# Patient Record
Sex: Female | Born: 1990
Health system: Southern US, Community
[De-identification: ages and names within clinical notes are randomized; demographics above are authoritative.]

## PROBLEM LIST (undated history)

## (undated) ENCOUNTER — Inpatient Hospital Stay: Payer: Self-pay

## (undated) DIAGNOSIS — F32A Depression, unspecified: Secondary | ICD-10-CM

## (undated) DIAGNOSIS — I10 Essential (primary) hypertension: Secondary | ICD-10-CM

## (undated) DIAGNOSIS — H8109 Meniere's disease, unspecified ear: Secondary | ICD-10-CM

## (undated) DIAGNOSIS — R0600 Dyspnea, unspecified: Secondary | ICD-10-CM

## (undated) DIAGNOSIS — F319 Bipolar disorder, unspecified: Secondary | ICD-10-CM

## (undated) DIAGNOSIS — F419 Anxiety disorder, unspecified: Secondary | ICD-10-CM

## (undated) DIAGNOSIS — D649 Anemia, unspecified: Secondary | ICD-10-CM

## (undated) DIAGNOSIS — R42 Dizziness and giddiness: Secondary | ICD-10-CM

## (undated) DIAGNOSIS — T8859XA Other complications of anesthesia, initial encounter: Secondary | ICD-10-CM

## (undated) DIAGNOSIS — F329 Major depressive disorder, single episode, unspecified: Secondary | ICD-10-CM

## (undated) DIAGNOSIS — K219 Gastro-esophageal reflux disease without esophagitis: Secondary | ICD-10-CM

## (undated) HISTORY — DX: Anxiety disorder, unspecified: F41.9

## (undated) HISTORY — DX: Gastro-esophageal reflux disease without esophagitis: K21.9

## (undated) HISTORY — DX: Depression, unspecified: F32.A

## (undated) HISTORY — PX: WRIST SURGERY: SHX841

## (undated) HISTORY — DX: Meniere's disease, unspecified ear: H81.09

## (undated) HISTORY — PX: TUBAL LIGATION: SHX77

## (undated) HISTORY — DX: Major depressive disorder, single episode, unspecified: F32.9

## (undated) HISTORY — PX: WISDOM TOOTH EXTRACTION: SHX21

---
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
## 2011-03-12 ENCOUNTER — Emergency Department: Payer: Self-pay | Admitting: Emergency Medicine

## 2012-04-26 ENCOUNTER — Emergency Department: Payer: Self-pay | Admitting: Internal Medicine

## 2012-04-26 LAB — CBC
HGB: 12.8 g/dL (ref 12.0–16.0)
MCH: 29.1 pg (ref 26.0–34.0)
MCHC: 33 g/dL (ref 32.0–36.0)
RBC: 4.39 10*6/uL (ref 3.80–5.20)

## 2013-06-05 ENCOUNTER — Observation Stay: Payer: Self-pay | Admitting: Specialist

## 2013-06-05 LAB — COMPREHENSIVE METABOLIC PANEL
ANION GAP: 9 (ref 7–16)
Albumin: 3.7 g/dL (ref 3.4–5.0)
Alkaline Phosphatase: 59 U/L
BUN: 12 mg/dL (ref 7–18)
Bilirubin,Total: 0.3 mg/dL (ref 0.2–1.0)
CALCIUM: 8.7 mg/dL (ref 8.5–10.1)
Chloride: 106 mmol/L (ref 98–107)
Co2: 24 mmol/L (ref 21–32)
Creatinine: 0.76 mg/dL (ref 0.60–1.30)
EGFR (Non-African Amer.): >60
GLUCOSE: 142 mg/dL — AB (ref 65–99)
Osmolality: 280 (ref 275–301)
Potassium: 2.7 mmol/L — ABNORMAL LOW (ref 3.5–5.1)
SGOT(AST): 12 U/L — ABNORMAL LOW (ref 15–37)
SGPT (ALT): 18 U/L (ref 12–78)
SODIUM: 139 mmol/L (ref 136–145)
TOTAL PROTEIN: 7.4 g/dL (ref 6.4–8.2)

## 2013-06-05 LAB — CBC WITH DIFFERENTIAL/PLATELET
BASOS ABS: 0.2 10*3/uL — AB (ref 0.0–0.1)
Basophil %: 1.1 %
Eosinophil #: 0.3 10*3/uL (ref 0.0–0.7)
Eosinophil %: 2.2 %
HCT: 39.4 % (ref 35.0–47.0)
HGB: 12.9 g/dL (ref 12.0–16.0)
LYMPHS ABS: 5.9 10*3/uL — AB (ref 1.0–3.6)
Lymphocyte %: 42.2 %
MCH: 28.5 pg (ref 26.0–34.0)
MCHC: 32.8 g/dL (ref 32.0–36.0)
MCV: 87 fL (ref 80–100)
MONOS PCT: 4.7 %
Monocyte #: 0.7 x10 3/mm (ref 0.2–0.9)
NEUTROS ABS: 6.9 10*3/uL — AB (ref 1.4–6.5)
NEUTROS PCT: 49.8 %
Platelet: 320 10*3/uL (ref 150–440)
RBC: 4.55 10*6/uL (ref 3.80–5.20)
RDW: 13.1 % (ref 11.5–14.5)
WBC: 13.9 10*3/uL — ABNORMAL HIGH (ref 3.6–11.0)

## 2013-06-05 LAB — DRUG SCREEN, URINE
Amphetamines, Ur Screen: NEGATIVE (ref ?–1000)
BENZODIAZEPINE, UR SCRN: NEGATIVE (ref ?–200)
Barbiturates, Ur Screen: NEGATIVE (ref ?–200)
COCAINE METABOLITE, UR ~~LOC~~: NEGATIVE (ref ?–300)
Cannabinoid 50 Ng, Ur ~~LOC~~: NEGATIVE (ref ?–50)
MDMA (Ecstasy)Ur Screen: NEGATIVE (ref ?–500)
METHADONE, UR SCREEN: NEGATIVE (ref ?–300)
Opiate, Ur Screen: NEGATIVE (ref ?–300)
Phencyclidine (PCP) Ur S: NEGATIVE (ref ?–25)
TRICYCLIC, UR SCREEN: NEGATIVE (ref ?–1000)

## 2013-06-05 LAB — URINALYSIS, COMPLETE
Bacteria: NONE SEEN
Bilirubin,UR: NEGATIVE
Glucose,UR: NEGATIVE mg/dL (ref 0–75)
KETONE: NEGATIVE
LEUKOCYTE ESTERASE: NEGATIVE
Nitrite: NEGATIVE
PH: 8 (ref 4.5–8.0)
PROTEIN: NEGATIVE
RBC,UR: <1 /HPF (ref 0–5)
Specific Gravity: 1.006 (ref 1.003–1.030)
Squamous Epithelial: <1
WBC UR: <1 /HPF (ref 0–5)

## 2013-06-05 LAB — LIPASE, BLOOD: Lipase: 121 U/L (ref 73–393)

## 2013-06-05 LAB — HCG, QUANTITATIVE, PREGNANCY: Beta Hcg, Quant.: <1 m[IU]/mL — ABNORMAL LOW

## 2013-06-06 LAB — BASIC METABOLIC PANEL
ANION GAP: 4 — AB (ref 7–16)
BUN: 8 mg/dL (ref 7–18)
CALCIUM: 8.2 mg/dL — AB (ref 8.5–10.1)
Chloride: 112 mmol/L — ABNORMAL HIGH (ref 98–107)
Co2: 26 mmol/L (ref 21–32)
Creatinine: 0.63 mg/dL (ref 0.60–1.30)
EGFR (African American): >60
Glucose: 98 mg/dL (ref 65–99)
OSMOLALITY: 281 (ref 275–301)
Potassium: 3.9 mmol/L (ref 3.5–5.1)
SODIUM: 142 mmol/L (ref 136–145)

## 2013-08-15 ENCOUNTER — Other Ambulatory Visit: Payer: Self-pay | Admitting: Emergency Medicine

## 2013-08-15 LAB — TSH: THYROID STIMULATING HORM: 2.61 u[IU]/mL

## 2014-03-07 NOTE — L&D Delivery Note (Signed)
At 2250, pt exhausted and pushing with the vtx at a +4 station. Vtx does not appear to be moving any further. Called Dr Feliberto GottronSchermerhorn to advise of pt status and he approved a vacuum attempt per CNM,. Pt and partner agree with the risks, benefits and alternatives of vacuum including bleeding intracerebral bleeding, ventricular bleeding, potential for perineal repair, risk of damage to the fetal head including sloughing of scalp, scalp trauma and temporary molding of the head. Using Betadine to the vaginal area, a Bell vacuum was applied and pumped to the green and pt pushed with one contraction and the vacuum was not making good suction. So, vacuum was abandoned and a flat KIWI was applied and pulled with some movement of the head with one pop off. The vacuum was re-applied and pulling x 1 with slight movement of the vtx and began to pop-off. At this time, Dr Feliberto GottronSchermerhorn was called and advised that the vtx was not moving after a vacuum attempt. Pushing with UC's until Dr Filbert SchilderSchermerrhorn arrived. See Vacuum times on nursing notes for vacuum.   Dr Mosie EpsteinSchernmerhorn arrived (see times in nursing notes) and assessed the situation. He dictated his part of the delivery.   Pt bonding skin to skin with infant after repair completed.

## 2014-06-14 ENCOUNTER — Emergency Department
Admit: 2014-06-14 | Disposition: A | Discharge status: Home / Self Care | Payer: Self-pay | Admitting: Emergency Medicine

## 2014-06-14 LAB — URINALYSIS, COMPLETE
BLOOD: NEGATIVE
Bilirubin,UR: NEGATIVE
Glucose,UR: NEGATIVE mg/dL (ref 0–75)
KETONE: NEGATIVE
NITRITE: NEGATIVE
PH: 8 (ref 4.5–8.0)
PROTEIN: NEGATIVE
RBC,UR: NONE SEEN /HPF (ref 0–5)
SPECIFIC GRAVITY: 1.012 (ref 1.003–1.030)
Squamous Epithelial: NONE SEEN

## 2014-06-14 LAB — COMPREHENSIVE METABOLIC PANEL
ALBUMIN: 3.6 g/dL
ANION GAP: 6 — AB (ref 7–16)
Alkaline Phosphatase: 38 U/L
BILIRUBIN TOTAL: 0.4 mg/dL
BUN: 6 mg/dL
CO2: 22 mmol/L
Calcium, Total: 9.2 mg/dL
Chloride: 109 mmol/L
Creatinine: 0.36 mg/dL — ABNORMAL LOW
EGFR (African American): >60
EGFR (Non-African Amer.): >60
Glucose: 95 mg/dL
POTASSIUM: 3.4 mmol/L — AB
SGOT(AST): 19 U/L
SGPT (ALT): 16 U/L
Sodium: 137 mmol/L
Total Protein: 6.9 g/dL

## 2014-06-14 LAB — WET PREP, GENITAL

## 2014-06-14 LAB — HCG, QUANTITATIVE, PREGNANCY: BETA HCG, QUANT.: 101087 m[IU]/mL — AB

## 2014-06-15 LAB — CBC WITH DIFFERENTIAL/PLATELET
Basophil #: 0.1 10*3/uL (ref 0.0–0.1)
Basophil %: 0.6 %
EOS ABS: 0.3 10*3/uL (ref 0.0–0.7)
Eosinophil %: 2.6 %
HCT: 35.1 % (ref 35.0–47.0)
HGB: 11.6 g/dL — ABNORMAL LOW (ref 12.0–16.0)
LYMPHS ABS: 2.9 10*3/uL (ref 1.0–3.6)
LYMPHS PCT: 28.7 %
MCH: 28.1 pg (ref 26.0–34.0)
MCHC: 33 g/dL (ref 32.0–36.0)
MCV: 85 fL (ref 80–100)
Monocyte #: 0.6 x10 3/mm (ref 0.2–0.9)
Monocyte %: 5.5 %
Neutrophil #: 6.4 10*3/uL (ref 1.4–6.5)
Neutrophil %: 62.6 %
Platelet: 203 10*3/uL (ref 150–440)
RBC: 4.12 10*6/uL (ref 3.80–5.20)
RDW: 13.2 % (ref 11.5–14.5)
WBC: 10.3 10*3/uL (ref 3.6–11.0)

## 2014-06-15 LAB — GC/CHLAMYDIA PROBE AMP

## 2014-06-28 NOTE — H&P (Signed)
PATIENT NAME:  Sonya [name], Sonya [name] MR#:  [id number] DATE OF BIRTH:  27-Aug-1990  DATE OF ADMISSION:  06/05/2013  PRIMARY CARE PHYSICIAN:  Does not have one.  CHIEF COMPLAINT: Nausea, vomiting and dizziness.   HISTORY OF PRESENT ILLNESS: This is a 24 year old female who presents to the Emergency Room due to sudden onset of nausea and vomiting that began overnight. The patient says she has had 6 episodes of vomiting since this morning, which has been nonbloody and bilious in nature. She denies any sick contacts. She denies any diarrhea or fevers, chills. She does admit to some mild abdominal pain, but that was after her vomiting, not prior to it. She also complained of significant dizziness where she seems to sway to one side. When she opens her eyes, she feels like the room is spinning. She came to the ER for further evaluation. The patient was noted to be hypokalemic and possibly had underlying vertigo. She continues to still have some dizziness and nausea, vomiting, and hospitalist services were contacted for further treatment and evaluation.   REVIEW OF SYSTEMS:  CONSTITUTIONAL: No documented fever. No weight gain or weight loss.  EYES: No blurred or double vision.  ENT: No tinnitus. No postnasal drip. No redness of the oropharynx.  RESPIRATORY: No cough. No wheeze. No hemoptysis. No dyspnea.  CARDIOVASCULAR: No chest pain. No orthopnea, palpitations or syncope.  GASTROINTESTINAL: Positive nausea. Positive vomiting. No diarrhea. Positive abdominal pain. No melena or hematochezia.  GENITOURINARY: No dysuria or hematuria.  ENDOCRINE: No polyuria or nocturia. No heat or cold intolerance.  HEMATOLOGIC: No anemia. No bruising. No bleeding.  INTEGUMENTARY: No rashes. No lesions.  MUSCULOSKELETAL: No arthritis. No swelling. No gout.  NEUROLOGIC: No numbness or tingling. No ataxia. No seizurelike activity.  PSYCHIATRIC: No anxiety. No insomnia. No ADD.   PAST MEDICAL HISTORY:   The patient has no  significant past medical history.   ALLERGIES: CODEINE, WHICH CAUSES NAUSEA AND VOMITING.   SOCIAL HISTORY: No smoking. Occasional alcohol use. No illicit drug abuse. Lives at home with her boyfriend.   FAMILY HISTORY: Mother has some heart disease and thyroid problems. She cannot recall what runs on her father's side of the family.   CURRENT MEDICATIONS: She is currently on no medications.   PHYSICAL EXAMINATION: Presently is as follows:  VITAL SIGNS:  Noted to be: Temperature is 97.5, pulse 93, respirations 16, blood pressure 141/73, sats 100% on room air.  GENERAL: The patient is a pleasant-appearing female in no apparent distress.  HEAD, EYES, EARS, NOSE, THROAT:  She is atraumatic, normocephalic. Extraocular muscles are intact. Pupils equal and reactive to light. Sclerae anicteric. No conjunctival injection. No pharyngeal erythema.  NECK: Supple. There is no jugular venous distention. No bruits. No lymphadenopathy or thyromegaly.  HEART: Regular rate, rhythm. No murmurs. No rubs. No clicks.  LUNGS: Clear to auscultation bilaterally. No rales. No rhonchi. No wheezes.  ABDOMEN: Soft, flat, nontender, nondistended. Has good bowel sounds. No hepatosplenomegaly appreciated.  EXTREMITIES: No evidence of any cyanosis, clubbing or peripheral edema. Has +2  pedal and radial pulses bilaterally.  NEUROLOGIC: The patient is alert, awake and oriented x 3 with no focal motor or sensory deficits appreciated bilaterally.  SKIN: Moist, warm with no rashes appreciated.  LYMPHATIC: There is no cervical or axillary lymphadenopathy.   LABORATORY DATA: Showed a serum glucose of 132, BUN 12, creatinine 0.7, sodium 139, potassium 2.7, chloride 106, bicarb 24. LFTs are within normal limits. White cell count is 13.9, hemoglobin 12.9, hematocrit 39.4,  platelet count 320. Beta-hCG is negative. Urinalysis within normal limits.   The patient did have a CT head without contrast showing no acute abnormality.    ASSESSMENT AND PLAN: This is a 24 year old female with no significant past medical history, presents to the hospital due to nausea, vomiting and dizziness.  1.  Vertigo. This is likely the cause of the patient's intractable nausea, vomiting and dizziness. The patient's CT head is negative. Her clinical symptoms have improved with some Phenergan and Valium in the Emergency Room. She has no further neurological symptoms and a nonfocal neurological exam. For now, I will start her on meclizine t.i.d., also place her on p.r.n. Valium and Phenergan and Zofran. If she does not clinically improve in the next 24 hours, we will consider neurology, or even an ENT evaluation.  2.  Nausea, vomiting; this is likely related to the vertigo. Continue supportive care with antiemetics and follow her clinically.  3.  Hypokalemia. This is likely secondary to the intractable nausea, vomiting, I would replace her electrolytes and follow them in the morning.   The patient is a FULL CODE.   Time spent is 45 minutes.    ____________________________ Rolly Pancake. Cherlynn Kaiser, MD vjs:dmm D: 06/05/2013 10:25:44 ET T: 06/05/2013 10:36:42 ET JOB#: [id number]  cc: Rolly Pancake. Cherlynn Kaiser, MD, <Dictator> Houston Siren MD ELECTRONICALLY SIGNED 06/05/2013 14:53

## 2014-06-28 NOTE — Discharge Summary (Signed)
PATIENT NAME:  Sonya [name], Kyaira MR#:  [id number] DATE OF BIRTH:  06-16-1990  DATE OF ADMISSION:  06/05/2013 DATE OF DISCHARGE:  06/06/2013  For a detailed note, please see the history and physical done on admission.   DIAGNOSES AT DISCHARGE:  Vertigo. Intractable nausea, vomiting due to the vertigo.  Hypokalemia.   DIET:  The patient is being discharged on a regular diet.   ACTIVITY: As tolerated.   DISCHARGE MEDICATIONS: Meclizine 25 mg t.i.d.,  Zofran disintegrating tablet 4 mg q. 6 hours as needed for nausea, vomiting.   PERTINENT STUDIES DONE DURING THE HOSPITAL COURSE: A CT scan of the head done without contrast on admission showing no acute intracranial abnormality.   BRIEF HOSPITAL COURSE: This is a 24 year old female who presented to the hospital with intractable nausea, vomiting, and dizziness.   ASSESSMENT: 1. Vertigo. This was likely the cause of the patient's intractable nausea, vomiting, and dizziness. The patient was treated supportively in the ER but symptoms persisted and therefore was admitted. She was admitted overnight with supportive care on some meclizine t.i.d. along with some p.r.n. Valium and some Zofran and Phenergan for nausea, vomiting. Overnight, the patient's clinical symptoms have significantly improved. She still has some mild dizziness but much improved from yesterday. She was ambulated and did well. She has had no further nausea, vomiting, and is tolerating some clear liquids well. At this point, I am discharging her on some oral meclizine and Zofran as needed. If she continues to have persistent dizziness and vertigo, she likely would benefit from seeing an ENT physician as an outpatient, which I did explain to her.  2. Nausea, vomiting. This was likely secondary to the vertigo. The patient was treated supportively with antiemetics with Phenergan and Zofran and her symptoms have significantly improved.   She is currently being discharged on some as needed Zofran.    3. Hypokalemia. This was likely secondary to the vomiting. It has since then been supplemented and now resolved.   CODE STATUS: The patient is a full code.   DISPOSITION: She is being discharged home.   TIME SPENT: 35 minutes.    ____________________________ Rolly PancakeVivek J. Cherlynn KaiserSainani, MD vjs:dd D: 06/06/2013 15:32:10 ET T: 06/06/2013 19:12:42 ET JOB#: [id number]  cc: Rolly PancakeVivek J. Cherlynn KaiserSainani, MD, <Dictator> Houston SirenVIVEK J [place] Steidle MD ELECTRONICALLY SIGNED 06/16/2013 18:43

## 2014-06-30 ENCOUNTER — Encounter
Admit: 2014-06-30 | Disposition: A | Discharge status: Home / Self Care | Payer: Self-pay | Attending: Obstetrics & Gynecology | Admitting: Obstetrics & Gynecology

## 2014-07-01 DIAGNOSIS — Q928 Other specified trisomies and partial trisomies of autosomes: Secondary | ICD-10-CM | POA: Insufficient documentation

## 2014-07-01 DIAGNOSIS — Q922 Partial trisomy: Secondary | ICD-10-CM | POA: Insufficient documentation

## 2014-07-01 DIAGNOSIS — Z720 Tobacco use: Secondary | ICD-10-CM | POA: Insufficient documentation

## 2014-07-07 ENCOUNTER — Telehealth: Payer: Self-pay | Admitting: Obstetrics and Gynecology

## 2014-07-07 NOTE — Telephone Encounter (Signed)
  Ms. Sonya [name] elected to undergo First Trimester screening on 06/30/14.  To review, first trimester screening, includes nuchal translucency ultrasound screen and/or first trimester maternal serum marker screening.  The nuchal translucency has approximately an 80% detection rate for Down syndrome and can be positive for other chromosome abnormalities as well as heart defects.  When combined with a maternal serum marker screening, the detection rate is up to 90% for Down syndrome and up to 97% for trisomy 13 and 18.     The results of the First Trimester Nuchal Translucency and Biochemical Screening were within normal range.  The risk for Down syndrome is now estimated to be less than 1 in 10,000.  The risk for Trisomy 13/18 is also estimated to be less than 1 in 10,000.  Should more definitive information be desired, we would offer amniocentesis.  Because we do not yet know the effectiveness of combined first and second trimester screening, we do not recommend a maternal serum screen to assess the chance for chromosome conditions.  However, if screening for neural tube defects is desired, maternal serum screening for AFP only can be performed between 15 and [redacted] weeks gestation.

## 2014-10-13 ENCOUNTER — Observation Stay
Admission: EM | Admit: 2014-10-13 | Discharge: 2014-10-13 | Disposition: A | Discharge status: Home / Self Care | Payer: 59 | Attending: Obstetrics and Gynecology | Admitting: Obstetrics and Gynecology

## 2014-10-13 DIAGNOSIS — O26893 Other specified pregnancy related conditions, third trimester: Principal | ICD-10-CM | POA: Insufficient documentation

## 2014-10-13 DIAGNOSIS — R030 Elevated blood-pressure reading, without diagnosis of hypertension: Secondary | ICD-10-CM | POA: Diagnosis present

## 2014-10-13 DIAGNOSIS — Z3A28 28 weeks gestation of pregnancy: Secondary | ICD-10-CM | POA: Insufficient documentation

## 2014-10-13 LAB — COMPREHENSIVE METABOLIC PANEL
ALT: 12 U/L — AB (ref 14–54)
AST: 17 U/L (ref 15–41)
Albumin: 3.3 g/dL — ABNORMAL LOW (ref 3.5–5.0)
Alkaline Phosphatase: 66 U/L (ref 38–126)
Anion gap: 12 (ref 5–15)
BUN: 7 mg/dL (ref 6–20)
CALCIUM: 9.1 mg/dL (ref 8.9–10.3)
CHLORIDE: 100 mmol/L — AB (ref 101–111)
CO2: 20 mmol/L — ABNORMAL LOW (ref 22–32)
Creatinine, Ser: 0.36 mg/dL — ABNORMAL LOW (ref 0.44–1.00)
GFR calc Af Amer: >60 mL/min (ref >60)
GFR calc non Af Amer: >60 mL/min (ref >60)
Glucose, Bld: 88 mg/dL (ref 65–99)
Potassium: 3.3 mmol/L — ABNORMAL LOW (ref 3.5–5.1)
SODIUM: 132 mmol/L — AB (ref 135–145)
TOTAL PROTEIN: 6.8 g/dL (ref 6.5–8.1)
Total Bilirubin: <0.1 mg/dL — ABNORMAL LOW (ref 0.3–1.2)

## 2014-10-13 LAB — CBC
HCT: 32.3 % — ABNORMAL LOW (ref 35.0–47.0)
HEMOGLOBIN: 11 g/dL — AB (ref 12.0–16.0)
MCH: 29.3 pg (ref 26.0–34.0)
MCHC: 34.1 g/dL (ref 32.0–36.0)
MCV: 85.9 fL (ref 80.0–100.0)
PLATELETS: 202 10*3/uL (ref 150–440)
RBC: 3.76 MIL/uL — ABNORMAL LOW (ref 3.80–5.20)
RDW: 13.6 % (ref 11.5–14.5)
WBC: 16.3 10*3/uL — ABNORMAL HIGH (ref 3.6–11.0)

## 2014-10-13 LAB — PROTEIN / CREATININE RATIO, URINE
CREATININE, URINE: 86 mg/dL
PROTEIN CREATININE RATIO: 0.17 mg/mg{creat} — AB (ref 0.00–0.15)
Total Protein, Urine: 15 mg/dL

## 2014-10-13 LAB — URIC ACID: Uric Acid, Serum: 3.7 mg/dL (ref 2.3–6.6)

## 2014-10-13 LAB — LACTATE DEHYDROGENASE: LDH: 105 U/L (ref 98–192)

## 2014-10-13 NOTE — OB Triage Note (Signed)
Pt here after being seen at [facility] clinic for foot pain, states PA saw swelling in right foot, accompanied by increased BP and told pt she may have pre-eclampsia. Did not look at left foot, which is not swollen upon assessment by RN. Pt with tenderness in right foot with downward flexion, has been in constant pain since Friday with no recalled injury. Reflexes brisk, non-pitting edema. Pt reports headache over the weekend that was not relieved by tylenol. States she woke up this AM and headache was gone. No visual changes, no epigastric pain.

## 2014-10-13 NOTE — Progress Notes (Signed)
Patient ID: Sonya [name], female   DOB: 15-Oct-1990, 24 y.o.   MRN: [id number] Cimberly Colette Ribas Jan 10, 1991 G2 P0 [redacted]w[redacted]d presents for eval of PIH for elevated BP at clinic today .  No LOF , no  vaginal bleeding , O;BP 119/72 mmHg  Pulse 102  Temp(Src) 98.8 F (37.1 C) (Oral)  Resp 18  Ht  (1.575 m)  Wt 181 lb (82.101 kg)  BMI 33.10 kg/m2  LMP 03/26/2014 ABD soft  CX not checked  NSTreassuring  Labs: pih labs  A: BP all normal up here , negative urine protein dip  P:d/c home . BAseline PIH labs

## 2014-11-26 ENCOUNTER — Encounter: Payer: Self-pay | Admitting: *Deleted

## 2014-11-26 ENCOUNTER — Observation Stay
Admission: EM | Admit: 2014-11-26 | Discharge: 2014-11-26 | Disposition: A | Discharge status: Home / Self Care | Payer: 59 | Attending: Obstetrics and Gynecology | Admitting: Obstetrics and Gynecology

## 2014-11-26 DIAGNOSIS — Z3A35 35 weeks gestation of pregnancy: Secondary | ICD-10-CM | POA: Diagnosis not present

## 2014-11-26 LAB — CBC WITH DIFFERENTIAL/PLATELET
Basophils Absolute: 0 10*3/uL (ref 0–0.1)
Basophils Relative: 0 %
Eosinophils Absolute: 0.2 10*3/uL (ref 0–0.7)
Eosinophils Relative: 1 %
HCT: 34 % — ABNORMAL LOW (ref 35.0–47.0)
HEMOGLOBIN: 11.6 g/dL — AB (ref 12.0–16.0)
LYMPHS ABS: 2.4 10*3/uL (ref 1.0–3.6)
LYMPHS PCT: 20 %
MCH: 29.9 pg (ref 26.0–34.0)
MCHC: 34.2 g/dL (ref 32.0–36.0)
MCV: 87.5 fL (ref 80.0–100.0)
Monocytes Absolute: 0.6 10*3/uL (ref 0.2–0.9)
Monocytes Relative: 5 %
NEUTROS ABS: 9 10*3/uL — AB (ref 1.4–6.5)
NEUTROS PCT: 74 %
Platelets: 161 10*3/uL (ref 150–440)
RBC: 3.88 MIL/uL (ref 3.80–5.20)
RDW: 13.7 % (ref 11.5–14.5)
WBC: 12.2 10*3/uL — AB (ref 3.6–11.0)

## 2014-11-26 LAB — URINALYSIS COMPLETE WITH MICROSCOPIC (ARMC ONLY)
Bilirubin Urine: NEGATIVE
Glucose, UA: NEGATIVE mg/dL
Hgb urine dipstick: NEGATIVE
Nitrite: NEGATIVE
Protein, ur: NEGATIVE mg/dL
Specific Gravity, Urine: 1.009 (ref 1.005–1.030)
pH: 7 (ref 5.0–8.0)

## 2014-11-26 LAB — FETAL FIBRONECTIN: Fetal Fibronectin: NEGATIVE

## 2014-11-26 LAB — CHLAMYDIA/NGC RT PCR (ARMC ONLY)
Chlamydia Tr: NOT DETECTED
N GONORRHOEAE: NOT DETECTED

## 2014-11-26 LAB — WET PREP, GENITAL
Clue Cells Wet Prep HPF POC: NONE SEEN
Trich, Wet Prep: NONE SEEN
YEAST WET PREP: POSITIVE — AB

## 2014-11-26 MED ORDER — FLUCONAZOLE 50 MG PO TABS
150.0000 mg | ORAL_TABLET | Freq: Once | ORAL | Status: AC
  Administered 2014-11-26: 150 mg via ORAL
  Filled 2014-11-26: qty 3

## 2014-11-26 MED ORDER — LACTATED RINGERS IV SOLN
INTRAVENOUS | Status: DC
  Administered 2014-11-26: 1000 mL via INTRAVENOUS

## 2014-11-26 MED ORDER — ACETAMINOPHEN 325 MG PO TABS: 650.0000 mg | ORAL_TABLET | ORAL | Status: DC | PRN

## 2014-11-26 NOTE — OB Triage Provider Note (Signed)
TRIAGE VISIT with NST   Sonya [name] is a 24 y.o. G1P0. She is at [redacted]w[redacted]d gestation.  Indication: Preterm contractions  S: Resting comfortably.  no VB. Active fetal movement. No recent intercourse. Contractions and cramping all day. No LOF.  O:  BP 126/64 mmHg  Pulse 93  Temp(Src) 98.3 F (36.8 C) (Oral)  Resp 18  Ht  (1.6 m)  Wt 81.647 kg (180 lb)  BMI 31.89 kg/m2  LMP 03/26/2014 Results for orders placed or performed during the hospital encounter of 11/26/14 (from the past 48 hour(s))  Urinalysis complete, with microscopic [facility] only)   Collection Time: 11/26/14  2:08 PM  Result Value Ref Range   Color, Urine STRAW (A) YELLOW   APPearance CLEAR (A) CLEAR   Glucose, UA NEGATIVE NEGATIVE mg/dL   Bilirubin Urine NEGATIVE NEGATIVE   Ketones, ur TRACE (A) NEGATIVE mg/dL   Specific Gravity, Urine 1.009 1.005 - 1.030   Hgb urine dipstick NEGATIVE NEGATIVE   pH 7.0 5.0 - 8.0   Protein, ur NEGATIVE NEGATIVE mg/dL   Nitrite NEGATIVE NEGATIVE   Leukocytes, UA 1+ (A) NEGATIVE   RBC / HPF 0-5 0 - 5 RBC/hpf   WBC, UA 0-5 0 - 5 WBC/hpf   Bacteria, UA RARE (A) NONE SEEN   Squamous Epithelial / LPF 6-30 (A) NONE SEEN   Mucous PRESENT   Fetal fibronectin   Collection Time: 11/26/14  2:10 PM  Result Value Ref Range   Fetal Fibronectin NEGATIVE NEGATIVE   Appearance, FETFIB CLEAR CLEAR  CBC with Differential/Platelet   Collection Time: 11/26/14  2:48 PM  Result Value Ref Range   WBC 12.2 (H) 3.6 - 11.0 K/uL   RBC 3.88 3.80 - 5.20 MIL/uL   Hemoglobin 11.6 (L) 12.0 - 16.0 g/dL   HCT 04.5 (L) 40.9 - 81.1 %   MCV 87.5 80.0 - 100.0 fL   MCH 29.9 26.0 - 34.0 pg   MCHC 34.2 32.0 - 36.0 g/dL   RDW 91.4 78.2 - 95.6 %   Platelets 161 150 - 440 K/uL   Neutrophils Relative % 74 %   Neutro Abs 9.0 (H) 1.4 - 6.5 K/uL   Lymphocytes Relative 20 %   Lymphs Abs 2.4 1.0 - 3.6 K/uL   Monocytes Relative 5 %   Monocytes Absolute 0.6 0.2 - 0.9 K/uL   Eosinophils Relative 1 %   Eosinophils  Absolute 0.2 0 - 0.7 K/uL   Basophils Relative 0 %   Basophils Absolute 0.0 0 - 0.1 K/uL  Wet prep, genital   Collection Time: 11/26/14  3:17 PM  Result Value Ref Range   Yeast Wet Prep HPF POC POSITIVE (A) NONE SEEN   Trich, Wet Prep NONE SEEN NONE SEEN   Clue Cells Wet Prep HPF POC NONE SEEN NONE SEEN   WBC, Wet Prep HPF POC MODERATE (A) NONE SEEN     Gen: NAD, AAOx3      Abd: FNTTP      Ext: Non-tender, Nonedmeatous    FHT: 140, mod var, +accels, no decels TOCO: q2-4 min SVE: Dilation: Fingertip Effacement (%): 50 Station: -3 Exam by:: gck   Bedside wet mount: +for yeast, neg for clue cells, ferning, trich  NST Reactive, see above   A/P:  24 y.o. G1P0 [redacted]w[redacted]d with preterm contractions.   Labor: not present. Unchanged cervix with serial exams.  Preterm contractions: Wet prep sent. Normal amount of white discharge. +yeast finding. Treated with diflucan while in house.  Fetal Wellbeing:  Reassuring Cat 1 tracing.  D/c home stable, precautions reviewed, follow-up as scheduled.

## 2014-11-26 NOTE — OB Triage Note (Addendum)
Pt also states that she has not had anything to eat or drink today due to just waking up when she called office.  Denies any signs or symptoms of urinary tract infection. Denies sexual intercourse in last 24 hours.

## 2014-11-26 NOTE — OB Triage Note (Signed)
Pt presents with complaints of cramping that started yesterday. Denies bleeding. Also complains of pelvic pressure

## 2014-11-26 NOTE — Discharge Instructions (Signed)
Get prescription at pharmacy and take as directed

## 2014-12-10 ENCOUNTER — Other Ambulatory Visit: Payer: Self-pay | Admitting: Obstetrics and Gynecology

## 2014-12-10 DIAGNOSIS — O26843 Uterine size-date discrepancy, third trimester: Secondary | ICD-10-CM

## 2014-12-12 ENCOUNTER — Ambulatory Visit
Admission: RE | Admit: 2014-12-12 | Discharge: 2014-12-12 | Disposition: A | Discharge status: Home / Self Care | Payer: 59 | Source: Ambulatory Visit | Attending: Obstetrics and Gynecology | Admitting: Obstetrics and Gynecology

## 2014-12-12 DIAGNOSIS — O26843 Uterine size-date discrepancy, third trimester: Secondary | ICD-10-CM

## 2014-12-12 DIAGNOSIS — Z36 Encounter for antenatal screening of mother: Secondary | ICD-10-CM | POA: Diagnosis present

## 2014-12-12 DIAGNOSIS — Z3A38 38 weeks gestation of pregnancy: Secondary | ICD-10-CM | POA: Insufficient documentation

## 2014-12-23 ENCOUNTER — Inpatient Hospital Stay
Admission: EM | Admit: 2014-12-23 | Discharge: 2014-12-29 | DRG: 774 | Disposition: A | Discharge status: Home / Self Care | Payer: 59 | Attending: Obstetrics and Gynecology | Admitting: Obstetrics and Gynecology

## 2014-12-23 DIAGNOSIS — S9492XA Injury of unspecified nerve at ankle and foot level, left leg, initial encounter: Secondary | ICD-10-CM | POA: Diagnosis not present

## 2014-12-23 DIAGNOSIS — O133 Gestational [pregnancy-induced] hypertension without significant proteinuria, third trimester: Secondary | ICD-10-CM | POA: Diagnosis present

## 2014-12-23 DIAGNOSIS — H538 Other visual disturbances: Secondary | ICD-10-CM | POA: Diagnosis not present

## 2014-12-23 DIAGNOSIS — Z6835 Body mass index (BMI) 35.0-35.9, adult: Secondary | ICD-10-CM

## 2014-12-23 DIAGNOSIS — R200 Anesthesia of skin: Secondary | ICD-10-CM | POA: Diagnosis not present

## 2014-12-23 DIAGNOSIS — Z87891 Personal history of nicotine dependence: Secondary | ICD-10-CM | POA: Diagnosis not present

## 2014-12-23 DIAGNOSIS — O1494 Unspecified pre-eclampsia, complicating childbirth: Secondary | ICD-10-CM | POA: Diagnosis present

## 2014-12-23 DIAGNOSIS — R000 Tachycardia, unspecified: Secondary | ICD-10-CM | POA: Diagnosis not present

## 2014-12-23 DIAGNOSIS — O9081 Anemia of the puerperium: Secondary | ICD-10-CM | POA: Diagnosis not present

## 2014-12-23 DIAGNOSIS — X58XXXA Exposure to other specified factors, initial encounter: Secondary | ICD-10-CM | POA: Diagnosis not present

## 2014-12-23 DIAGNOSIS — Z3A38 38 weeks gestation of pregnancy: Secondary | ICD-10-CM | POA: Diagnosis not present

## 2014-12-23 DIAGNOSIS — O99214 Obesity complicating childbirth: Secondary | ICD-10-CM | POA: Diagnosis present

## 2014-12-23 DIAGNOSIS — E669 Obesity, unspecified: Secondary | ICD-10-CM | POA: Diagnosis present

## 2014-12-23 DIAGNOSIS — D62 Acute posthemorrhagic anemia: Secondary | ICD-10-CM | POA: Diagnosis not present

## 2014-12-23 DIAGNOSIS — O9943 Diseases of the circulatory system complicating the puerperium: Secondary | ICD-10-CM | POA: Diagnosis not present

## 2014-12-23 LAB — PROTEIN / CREATININE RATIO, URINE
Creatinine, Urine: 122 mg/dL
Protein Creatinine Ratio: 0.48 mg/mg{Cre} — ABNORMAL HIGH (ref 0.00–0.15)
Total Protein, Urine: 59 mg/dL

## 2014-12-23 MED ORDER — TERBUTALINE SULFATE 1 MG/ML IJ SOLN: 0.2500 mg | Freq: Once | INTRAMUSCULAR | Status: DC | PRN

## 2014-12-23 MED ORDER — CITRIC ACID-SODIUM CITRATE 334-500 MG/5ML PO SOLN: 30.0000 mL | ORAL | Status: DC | PRN

## 2014-12-23 MED ORDER — FENTANYL CITRATE (PF) 100 MCG/2ML IJ SOLN: 50.0000 ug | INTRAMUSCULAR | Status: DC | PRN

## 2014-12-23 MED ORDER — LIDOCAINE HCL (PF) 1 % IJ SOLN
30.0000 mL | INTRAMUSCULAR | Status: DC | PRN
  Filled 2014-12-23: qty 30

## 2014-12-23 MED ORDER — DINOPROSTONE 10 MG VA INST
10.0000 mg | VAGINAL_INSERT | Freq: Once | VAGINAL | Status: AC
Start: 2014-12-23 — End: 2014-12-23
  Administered 2014-12-23: 10 mg via VAGINAL
  Filled 2014-12-23: qty 1

## 2014-12-23 MED ORDER — ACETAMINOPHEN 325 MG PO TABS
650.0000 mg | ORAL_TABLET | ORAL | Status: DC | PRN
  Administered 2014-12-24 – 2014-12-26 (×5): 650 mg via ORAL
  Filled 2014-12-23 (×5): qty 2

## 2014-12-23 MED ORDER — LACTATED RINGERS IV SOLN
INTRAVENOUS | Status: DC
  Administered 2014-12-23 – 2014-12-27 (×9): via INTRAVENOUS

## 2014-12-23 MED ORDER — OXYTOCIN 40 UNITS IN LACTATED RINGERS INFUSION - SIMPLE MED
62.5000 mL/h | INTRAVENOUS | Status: DC
  Administered 2014-12-26: 62.5 mL/h via INTRAVENOUS
  Filled 2014-12-23 (×2): qty 1000

## 2014-12-23 MED ORDER — LACTATED RINGERS IV SOLN
500.0000 mL | INTRAVENOUS | Status: DC | PRN
  Administered 2014-12-25: 1000 mL via INTRAVENOUS

## 2014-12-23 MED ORDER — OXYTOCIN BOLUS FROM INFUSION
500.0000 mL | INTRAVENOUS | Status: DC
  Administered 2014-12-25: 500 mL via INTRAVENOUS

## 2014-12-23 MED ORDER — ONDANSETRON HCL 4 MG/2ML IJ SOLN
4.0000 mg | Freq: Four times a day (QID) | INTRAMUSCULAR | Status: DC | PRN
  Administered 2014-12-25 (×2): 4 mg via INTRAVENOUS
  Filled 2014-12-23 (×2): qty 2

## 2014-12-23 NOTE — H&P (Signed)
  OB ADMISSION/ HISTORY & PHYSICAL:  Admission Date: 12/23/2014  9:25 PM  Admit Diagnosis: IOL at 38+6 weeks for pre-eclampsia   Sonya [name] is a 24 y.o. female is a G2P0 at 38+6 weeks today by LMP of 03/26/14 with an EDD of 12/31/14 who called the on call midwife with c/o elevated BPs on her home meter.  Earlier today, her 24 hour urine total protein was 880 and discussed to call if s/s worsen.  We have been closely monitoring her for preeclampsia d/t the onset of proteinuria with elevated BPs, intermittent HAs, and dependent lower and upper extremity edema.   Prenatal History: G1P0   EDC : 12/31/2014, by Last Menstrual Period of 03/26/14 [facility]renatal course complicated by obesity, tobacco use - but quit shortly after she found out she was pregnant, and FOB has a family hx of 4P partial trisomy syndrome  Prenatal Labs: ABO, Rh:  O Positive Antibody:  Negative Rubella:   Immune RPR:   NR HBsAg:   Negative HIV:   Negative GTT: 79 GBS:   Negative Varicella: Immune   Medical / Surgical History :  Past medical history: History reviewed. No pertinent past medical history.   Past surgical history: History reviewed. No pertinent past surgical history.  Family History: History reviewed. No pertinent family history.   Social History:  reports that she has quit smoking. She has never used smokeless tobacco. She reports that she does not drink alcohol or use illicit drugs.   Allergies: Codeine    Current Medications at time of admission:  Prior to Admission medications   Not on File     Review of Systems: Active FM Irregular contraction Denies VB, LOF, HA, visual disturbances, epigastric pain, N/V/C/D Reports HA yesterday   Physical Exam:  VS: Last menstrual period 03/26/2014.   General: alert and oriented, appears calm Heart: RRR Lungs: Clear lung fields Abdomen: Gravid, soft and non-tender, non-distended / uterus: non-tender,  irregular contractions Extremities: +1 dependent edema in bilateral lower extremities, no clonus, +3 reflexes  Genitalia / VE:   Dilation: 1 Effacement (%): 50 Cervical Position: Posterior Station: -2 Exam by:: M.Newton  FHR: baseline rate 145 / variability Moderate/ accelerations + / no decelerations TOCO: irregular  Assessment: 38+[redacted] weeks gestation IOL for preeclampsia  FHR category 1   Plan:  Ad[facility] to [facility] for IOL Cervidil x 1  PIH labs Continuous monitoring Fentanyl PRN for pain Routine labor and delivery orders  Dr Dalbert GarnetBeasley notified of admission / plan of care  Karena AddisonMeredith C Lanier Felty, CNM

## 2014-12-24 ENCOUNTER — Other Ambulatory Visit: Payer: Self-pay | Admitting: Obstetrics & Gynecology

## 2014-12-24 ENCOUNTER — Encounter: Payer: Self-pay | Admitting: *Deleted

## 2014-12-24 LAB — COMPREHENSIVE METABOLIC PANEL
ALT: 12 U/L — ABNORMAL LOW (ref 14–54)
ANION GAP: 7 (ref 5–15)
AST: 20 U/L (ref 15–41)
Albumin: 2.7 g/dL — ABNORMAL LOW (ref 3.5–5.0)
Alkaline Phosphatase: 120 U/L (ref 38–126)
BUN: 10 mg/dL (ref 6–20)
CHLORIDE: 109 mmol/L (ref 101–111)
CO2: 24 mmol/L (ref 22–32)
Calcium: 9 mg/dL (ref 8.9–10.3)
Creatinine, Ser: 0.38 mg/dL — ABNORMAL LOW (ref 0.44–1.00)
Glucose, Bld: 80 mg/dL (ref 65–99)
POTASSIUM: 3.6 mmol/L (ref 3.5–5.1)
Sodium: 140 mmol/L (ref 135–145)
TOTAL PROTEIN: 5.8 g/dL — AB (ref 6.5–8.1)

## 2014-12-24 LAB — CBC
HCT: 32.2 % — ABNORMAL LOW (ref 35.0–47.0)
Hemoglobin: 10.8 g/dL — ABNORMAL LOW (ref 12.0–16.0)
MCH: 29 pg (ref 26.0–34.0)
MCHC: 33.3 g/dL (ref 32.0–36.0)
MCV: 87 fL (ref 80.0–100.0)
PLATELETS: 151 10*3/uL (ref 150–440)
RBC: 3.7 MIL/uL — ABNORMAL LOW (ref 3.80–5.20)
RDW: 13.6 % (ref 11.5–14.5)
WBC: 14.8 10*3/uL — AB (ref 3.6–11.0)

## 2014-12-24 LAB — TYPE AND SCREEN
ABO/RH(D): O POS
ANTIBODY SCREEN: NEGATIVE

## 2014-12-24 LAB — URIC ACID: URIC ACID, SERUM: 5 mg/dL (ref 2.3–6.6)

## 2014-12-24 LAB — LACTATE DEHYDROGENASE: LDH: 126 U/L (ref 98–192)

## 2014-12-24 MED ORDER — DINOPROSTONE 10 MG VA INST
10.0000 mg | VAGINAL_INSERT | Freq: Once | VAGINAL | Status: AC
  Administered 2014-12-24: 10 mg via VAGINAL
  Filled 2014-12-24: qty 1

## 2014-12-24 MED ORDER — SODIUM CHLORIDE 0.9 % IJ SOLN
INTRAMUSCULAR | Status: AC
  Filled 2014-12-24: qty 50

## 2014-12-24 NOTE — Progress Notes (Signed)
Patient ID: Sonya [name], female   DOB: 08/23/1990, 24 y.o.   MRN: [id number] GBS undocumeted . Will treat in labor

## 2014-12-24 NOTE — Progress Notes (Signed)
Allow patient to eat regular diet for breakfast. Replace Cervadil once finished.

## 2014-12-24 NOTE — Progress Notes (Signed)
Patient ID: Sonya [name], female   DOB: 04/21/1990, 24 y.o.   MRN: [id number] [facility]  37[facility]2 weeks. Cervidil in place since midnight  cx now 1/ 70 /-3 VTX  BP good now  fhr : reassuring  A: PIH without severe criteria P: allow to eat and replace cervidil and cont induction

## 2014-12-25 ENCOUNTER — Encounter: Payer: Self-pay | Admitting: Anesthesiology

## 2014-12-25 ENCOUNTER — Inpatient Hospital Stay: Payer: 59 | Admitting: Anesthesiology

## 2014-12-25 LAB — RPR: RPR Ser Ql: NONREACTIVE

## 2014-12-25 MED ORDER — NALBUPHINE HCL 10 MG/ML IJ SOLN
5.0000 mg | INTRAMUSCULAR | Status: DC | PRN
  Filled 2014-12-25: qty 0.5

## 2014-12-25 MED ORDER — HYDROCODONE-ACETAMINOPHEN 5-325 MG PO TABS
1.0000 | ORAL_TABLET | ORAL | Status: DC | PRN
  Administered 2014-12-26: 1 via ORAL
  Filled 2014-12-25 (×2): qty 1

## 2014-12-25 MED ORDER — DIPHENHYDRAMINE HCL 50 MG/ML IJ SOLN: 12.5000 mg | INTRAMUSCULAR | Status: DC | PRN

## 2014-12-25 MED ORDER — DIPHENHYDRAMINE HCL 25 MG PO CAPS: 25.0000 mg | ORAL_CAPSULE | ORAL | Status: DC | PRN

## 2014-12-25 MED ORDER — FENTANYL 2.5 MCG/ML W/ROPIVACAINE 0.2% IN NS 100 ML EPIDURAL INFUSION (ARMC-ANES)
EPIDURAL | Status: AC
  Administered 2014-12-25: 10 mL/h via EPIDURAL
  Filled 2014-12-25: qty 100

## 2014-12-25 MED ORDER — OXYTOCIN 40 UNITS IN LACTATED RINGERS INFUSION - SIMPLE MED
1.0000 m[IU]/min | INTRAVENOUS | Status: DC
  Administered 2014-12-25: 2 m[IU]/min via INTRAVENOUS

## 2014-12-25 MED ORDER — SODIUM CHLORIDE 0.9 % IV SOLN
INTRAVENOUS | Status: AC
  Administered 2014-12-25: 2 g via INTRAVENOUS
  Filled 2014-12-25: qty 2000

## 2014-12-25 MED ORDER — MEPERIDINE HCL 25 MG/ML IJ SOLN: 6.2500 mg | INTRAMUSCULAR | Status: DC | PRN

## 2014-12-25 MED ORDER — NALBUPHINE HCL 10 MG/ML IJ SOLN
5.0000 mg | Freq: Once | INTRAMUSCULAR | Status: DC | PRN
  Filled 2014-12-25: qty 0.5

## 2014-12-25 MED ORDER — AMPICILLIN SODIUM 1 G IJ SOLR
1.0000 g | INTRAMUSCULAR | Status: DC
  Administered 2014-12-25 (×4): 1 g via INTRAVENOUS
  Filled 2014-12-25 (×8): qty 1000

## 2014-12-25 MED ORDER — KETOROLAC TROMETHAMINE 30 MG/ML IJ SOLN: 30.0000 mg | Freq: Four times a day (QID) | INTRAMUSCULAR | Status: AC | PRN

## 2014-12-25 MED ORDER — NALOXONE HCL 0.4 MG/ML IJ SOLN: 0.4000 mg | INTRAMUSCULAR | Status: DC | PRN

## 2014-12-25 MED ORDER — BUPIVACAINE HCL (PF) 0.25 % IJ SOLN
INTRAMUSCULAR | Status: DC | PRN
  Administered 2014-12-25 (×2): 5 mL via EPIDURAL

## 2014-12-25 MED ORDER — ONDANSETRON HCL 4 MG/2ML IJ SOLN: 4.0000 mg | Freq: Three times a day (TID) | INTRAMUSCULAR | Status: DC | PRN

## 2014-12-25 MED ORDER — FENTANYL 2.5 MCG/ML W/ROPIVACAINE 0.2% IN NS 100 ML EPIDURAL INFUSION (ARMC-ANES)
EPIDURAL | Status: AC
  Administered 2014-12-25: 250 ug
  Filled 2014-12-25: qty 100

## 2014-12-25 MED ORDER — NALOXONE HCL 2 MG/2ML IJ SOSY
1.0000 ug/kg/h | PREFILLED_SYRINGE | INTRAMUSCULAR | Status: DC | PRN
  Filled 2014-12-25: qty 2

## 2014-12-25 MED ORDER — SODIUM CHLORIDE 0.9 % IJ SOLN
INTRAMUSCULAR | Status: AC
  Filled 2014-12-25: qty 10

## 2014-12-25 MED ORDER — FENTANYL 2.5 MCG/ML W/ROPIVACAINE 0.2% IN NS 100 ML EPIDURAL INFUSION (ARMC-ANES)
10.0000 mL/h | EPIDURAL | Status: DC
  Administered 2014-12-25: 10 mL/h via EPIDURAL
  Filled 2014-12-25: qty 100

## 2014-12-25 MED ORDER — SODIUM CHLORIDE 0.9 % IV SOLN
2.0000 g | Freq: Once | INTRAVENOUS | Status: AC
  Administered 2014-12-25: 2 g via INTRAVENOUS

## 2014-12-25 MED ORDER — SODIUM CHLORIDE 0.9 % IJ SOLN: 3.0000 mL | INTRAMUSCULAR | Status: DC | PRN

## 2014-12-25 NOTE — Progress Notes (Signed)
S: Resting comfortably withepidural. + CTX, + LOF,  No VB O: Filed Vitals:   12/25/14 0601 12/25/14 0627 12/25/14 0723 12/25/14 0833  BP:  112/57 124/72 115/70  Pulse:  78 88 87  Temp: 98.1 F (36.7 C)  98.3 F (36.8 C)   TempSrc: Oral  Oral   Resp:   20 20  Height:      Weight:      SpO2:        Gen: NAD, AAOx3      Abd: Gravid, EFW 8#8oz      Ext: Non-tender, + edmeatous    FHT: + mod var + accelerations no decelerations TOCO: Q 2-2.5  min SVE: 6/80/vtx-2   A/P:  24 y.o. yo G2P0010 at 9162w1d for    Forebag ruptured at [id number] with mod particualte meconium  FWB: Reassuring Cat 1 tracing. EFW  8#8oz  GBS: neg per pt but, report not found in EPIC or at [facility] so Antibiotics are being given  Labor: IUPC placed and Pitocin at 4 mun/min Report to Dr Feliberto GottronSchermerhorn and agrees with plan of care. Aware of pitocin augmentation and status.    Sonya [name] 8:51 AM

## 2014-12-25 NOTE — Plan of Care (Signed)
Problem: Consults Goal: Birthing Suites Patient Information Press F2 to bring up selections list   Pt 37-[redacted] weeks EGA and Inpatient induction     

## 2014-12-25 NOTE — Progress Notes (Signed)
S: Resting comfortably with epidural. + CTX, no LOF, VB O: Filed Vitals:   12/25/14 1000 12/25/14 1114 12/25/14 1227 12/25/14 1231  BP:  112/57  142/72  Pulse:  80  91  Temp: 97.4 F (36.3 C)  98.4 F (36.9 C)   TempSrc: Oral  Oral   Resp:  20 20   Height:      Weight:      SpO2:         Gen: NAD, AAOx3      Abd: FNTTP      Ext: Non-tender, Nonedmeatous    FHT: +  mod var + accelerations no decelerations TOCO: Q q 2-3 mins, MVU's 200 SVE:8/90/vtx-1   A/P:  24 y.o. yo G2P0010 at 322w1d for  Delivery on Pitocin  Labor:progressing  FWB: Reassuring Cat 1 tracing. EFW 8#8oz  GBS: unknown  Continue to monitor uC/FH's  Antic SVD.   Sharee Pimplearon W Kaveri Perras 1:31 PM

## 2014-12-25 NOTE — Progress Notes (Signed)
S: Resting comfortably with functiuonal epidural. + CTX, no LOF, VB O: Filed Vitals:   12/25/14 1634 12/25/14 1700 12/25/14 1734 12/25/14 1800  BP: 132/92  124/64   Pulse: 103  102   Temp:  98.4 F (36.9 C)    TempSrc:  Oral    Resp:  20  20  Height:      Weight:      SpO2:         Gen: NAD, AAOx3      Abd: Gravid, nT.      Ext: Non-tender, Nonedmeatous    FHT:mod var + accelerations no decelerations TOCO: Q 2 min SVE:C/C/vtx+1 station   A/P:  24 y.o. yo G2P0010 at 1345w1d for  IOL for elevatred protein.  Labor:progressive  FWB: Reassuring Cat 1 tracing. EFW 8#8oz  GBS: unknown Antic SVD, start pushing   Sharee Pimplearon W Jones 6:35 PM

## 2014-12-25 NOTE — Progress Notes (Signed)
S: Resting comfortably with functioning epidural. + CTX, no LOF, VB O: Filed Vitals:   12/25/14 1434 12/25/14 1507 12/25/14 1534 12/25/14 1634  BP: 136/77  134/87 132/92  Pulse: 102  97 103  Temp:  98.2 F (36.8 C)    TempSrc:  Oral    Resp:  20    Height:      Weight:      SpO2:         Gen: NAD, AAOx3      Abd: FNTTP      Ext: Non-tender, Nonedmeatous    FHT: + mod var + accelerations no decelerations TOCO: Q 2 min SVE:9/90%/vtx+1   A/P:  24 y.o. yo G2P0010 at 6448w1d forIOL and delivery.   Labor: Progressing  FWB: Reassuring Cat 1 tracing. EFW 8#8oz  WUJ:[id number]:unknown   Sonya Pimplearon W Sonya [name] 4:52 PM

## 2014-12-25 NOTE — Anesthesia Preprocedure Evaluation (Signed)
Anesthesia Evaluation  Patient identified by MRN, date of birth, ID band Patient awake    Reviewed: Allergy & Precautions, NPO status , Patient's Chart, lab work & pertinent test results, reviewed documented beta blocker date and time   Airway Mallampati: II  TM Distance: >3 FB     Dental  (+) Chipped   Pulmonary former smoker,           Cardiovascular      Neuro/Psych    GI/Hepatic   Endo/Other    Renal/GU      Musculoskeletal   Abdominal   Peds  Hematology   Anesthesia Other Findings   Reproductive/Obstetrics                             Anesthesia Physical Anesthesia Plan  ASA: II  Anesthesia Plan: Epidural   Post-op Pain Management:    Induction:   Airway Management Planned:   Additional Equipment:   Intra-op Plan:   Post-operative Plan:   Informed Consent: I have reviewed the patients History and Physical, chart, labs and discussed the procedure including the risks, benefits and alternatives for the proposed anesthesia with the patient or authorized representative who has indicated his/her understanding and acceptance.     Plan Discussed with:   Anesthesia Plan Comments:         Anesthesia Quick Evaluation

## 2014-12-25 NOTE — Progress Notes (Signed)
S: Resting comfortably with epidural. + CTX, no LOF, VB O: Filed Vitals:   12/25/14 1419 12/25/14 1434 12/25/14 1507 12/25/14 1534  BP: 132/90 136/77  134/87  Pulse: 102 102  97  Temp:   98.2 F (36.8 C)   TempSrc:   Oral   Resp:   20   Height:      Weight:      SpO2:         Gen: NAD, AAOx3      Abd: FNTTP      Ext: Non-tender, Nonedmeatous    FHT:+ mod var + accelerations no decelerations, Cat I strip TOCO: Q 2 min, Adequate MVU's,  SVE: 9/100/vtx-1   A/P:  24 y.o. yo G2P0010 at 461w1d for IOL due to rising protein/BP  Labor: progressing  FWB: Reassuring Cat 1 tracing. EFW 8#8oz  GBS: unknown   Sharee Pimplearon W Hughes [place]ham 3:40 PM

## 2014-12-25 NOTE — Anesthesia Procedure Notes (Signed)
Epidural Patient location during procedure: OB  Staffing Anesthesiologist: Berdine AddisonHOMAS, Nishi Neiswonger Performed by: anesthesiologist   Preanesthetic Checklist Completed: patient identified, site marked, surgical consent, pre-op evaluation, timeout performed, IV checked, risks and benefits discussed and monitors and equipment checked  Epidural Patient position: sitting Prep: Betadine Patient monitoring: heart rate, continuous pulse ox and blood pressure Approach: midline Location: L4-L5 Injection technique: LOR saline  Needle:  Needle type: Tuohy  Needle gauge: 18 G Needle length: 9 cm and 9 Catheter type: closed end flexible Catheter size: 20 Guage Test dose: negative and 1.5% lidocaine with Epi 1:200 K  Assessment Sensory level: T10 Events: blood not aspirated, injection not painful, no injection resistance, negative IV test and no paresthesia  Additional Notes   Patient tolerated the insertion well without complications.[id number] catheter placement. [id number] test. 0242 5ml marcaine .25%. 0247 infusion.Reason for block:procedure for pain

## 2014-12-25 NOTE — Progress Notes (Signed)
Assumed care.  23yo G2P0010 @ 39+1 here for IOL for preeclampsia w/o severe features  O: BP 133/88 mmHg  Pulse 87  Temp(Src) 97.9 F (36.6 C) (Oral)  Resp 16  Ht 5\' 3"  (1.6 m)  Wt 89.812 kg (198 lb)  BMI 35.08 kg/m2  LMP 03/26/2014   S/p cervadil x 2 Foley placed for 1/50/mid  140 mod var, +accels, no decels Toco: irregular  Plan: - continue to monitor BPs - cont IOL and cervical ripening - foley tugs every 4 hours - pitocin when foley comes out - PCN when pit starts - monitor I/O - ok to heplock overnight given stable BPs  Sonya DachJohanna K Shloma Roggenkamp, MD

## 2014-12-26 LAB — CBC
HCT: 23.9 % — ABNORMAL LOW (ref 35.0–47.0)
Hemoglobin: 8 g/dL — ABNORMAL LOW (ref 12.0–16.0)
MCH: 29.4 pg (ref 26.0–34.0)
MCHC: 33.5 g/dL (ref 32.0–36.0)
MCV: 87.7 fL (ref 80.0–100.0)
PLATELETS: 130 10*3/uL — AB (ref 150–440)
RBC: 2.73 MIL/uL — AB (ref 3.80–5.20)
RDW: 14 % (ref 11.5–14.5)
WBC: 19.1 10*3/uL — ABNORMAL HIGH (ref 3.6–11.0)

## 2014-12-26 MED ORDER — BENZOCAINE-MENTHOL 20-0.5 % EX AERO
1.0000 "application " | INHALATION_SPRAY | CUTANEOUS | Status: DC | PRN
  Administered 2014-12-26 – 2014-12-28 (×2): 1 via TOPICAL
  Filled 2014-12-26 (×2): qty 56

## 2014-12-26 MED ORDER — OXYTOCIN 40 UNITS IN LACTATED RINGERS INFUSION - SIMPLE MED: 62.5000 mL/h | INTRAVENOUS | Status: DC | PRN

## 2014-12-26 MED ORDER — PSYLLIUM 95 % PO PACK
1.0000 | PACK | Freq: Every day | ORAL | Status: DC
Start: 2014-12-26 — End: 2014-12-29
  Administered 2014-12-26 – 2014-12-27 (×2): 1 via ORAL
  Filled 2014-12-26 (×6): qty 1

## 2014-12-26 MED ORDER — TETANUS-DIPHTH-ACELL PERTUSSIS 5-2.5-18.5 LF-MCG/0.5 IM SUSP: 0.5000 mL | Freq: Once | INTRAMUSCULAR | Status: DC

## 2014-12-26 MED ORDER — LANOLIN HYDROUS EX OINT: TOPICAL_OINTMENT | CUTANEOUS | Status: DC | PRN

## 2014-12-26 MED ORDER — BISACODYL 10 MG RE SUPP
10.0000 mg | Freq: Every day | RECTAL | Status: DC | PRN
  Filled 2014-12-26: qty 1

## 2014-12-26 MED ORDER — HYDROCODONE-ACETAMINOPHEN 5-325 MG PO TABS
1.0000 | ORAL_TABLET | ORAL | Status: DC | PRN
  Administered 2014-12-26 (×2): 1 via ORAL
  Administered 2014-12-26: 2 via ORAL
  Administered 2014-12-27 – 2014-12-29 (×7): 1 via ORAL
  Filled 2014-12-26: qty 2
  Filled 2014-12-26 (×3): qty 1
  Filled 2014-12-26: qty 2
  Filled 2014-12-26: qty 1
  Filled 2014-12-26: qty 2
  Filled 2014-12-26 (×4): qty 1
  Filled 2014-12-26: qty 2
  Filled 2014-12-26: qty 1
  Filled 2014-12-26 (×2): qty 2

## 2014-12-26 MED ORDER — IBUPROFEN 600 MG PO TABS
ORAL_TABLET | ORAL | Status: AC
  Filled 2014-12-26: qty 1

## 2014-12-26 MED ORDER — PRENATAL MULTIVITAMIN CH
1.0000 | ORAL_TABLET | Freq: Every day | ORAL | Status: DC
  Administered 2014-12-26 – 2014-12-29 (×4): 1 via ORAL
  Filled 2014-12-26 (×6): qty 1

## 2014-12-26 MED ORDER — MEASLES, MUMPS & RUBELLA VAC ~~LOC~~ INJ: 0.5000 mL | INJECTION | Freq: Once | SUBCUTANEOUS | Status: DC

## 2014-12-26 MED ORDER — ACETAMINOPHEN 325 MG PO TABS: 650.0000 mg | ORAL_TABLET | ORAL | Status: DC | PRN

## 2014-12-26 MED ORDER — WITCH HAZEL-GLYCERIN EX PADS: 1.0000 "application " | MEDICATED_PAD | CUTANEOUS | Status: DC | PRN

## 2014-12-26 MED ORDER — DIBUCAINE 1 % RE OINT: 1.0000 "application " | TOPICAL_OINTMENT | RECTAL | Status: DC | PRN

## 2014-12-26 MED ORDER — ZOLPIDEM TARTRATE 5 MG PO TABS: 5.0000 mg | ORAL_TABLET | Freq: Every evening | ORAL | Status: DC | PRN

## 2014-12-26 MED ORDER — FLEET ENEMA 7-19 GM/118ML RE ENEM: 1.0000 | ENEMA | Freq: Every day | RECTAL | Status: DC | PRN

## 2014-12-26 MED ORDER — IBUPROFEN 600 MG PO TABS
600.0000 mg | ORAL_TABLET | Freq: Four times a day (QID) | ORAL | Status: DC
  Administered 2014-12-26 – 2014-12-29 (×14): 600 mg via ORAL
  Filled 2014-12-26 (×12): qty 1

## 2014-12-26 MED ORDER — SODIUM CHLORIDE 0.9 % IJ SOLN: 3.0000 mL | INTRAMUSCULAR | Status: DC | PRN

## 2014-12-26 MED ORDER — DOCUSATE SODIUM 100 MG PO CAPS
100.0000 mg | ORAL_CAPSULE | Freq: Two times a day (BID) | ORAL | Status: DC
  Administered 2014-12-26 – 2014-12-29 (×6): 100 mg via ORAL
  Filled 2014-12-26 (×6): qty 1

## 2014-12-26 MED ORDER — SODIUM CHLORIDE 0.9 % IJ SOLN: 3.0000 mL | Freq: Two times a day (BID) | INTRAMUSCULAR | Status: DC

## 2014-12-26 MED ORDER — SIMETHICONE 80 MG PO CHEW
80.0000 mg | CHEWABLE_TABLET | ORAL | Status: DC | PRN
  Administered 2014-12-27: 80 mg via ORAL
  Filled 2014-12-26: qty 1

## 2014-12-26 MED ORDER — SENNOSIDES-DOCUSATE SODIUM 8.6-50 MG PO TABS
2.0000 | ORAL_TABLET | ORAL | Status: DC
  Administered 2014-12-28: 2 via ORAL
  Filled 2014-12-26 (×3): qty 2

## 2014-12-26 MED ORDER — SODIUM CHLORIDE 0.9 % IV SOLN: 250.0000 mL | INTRAVENOUS | Status: DC | PRN

## 2014-12-26 NOTE — Progress Notes (Addendum)
Post Partum Day 1 Subjective: no complaints  Objective: Blood pressure 129/78, pulse 92, temperature 97.9 F (36.6 C), temperature source Oral, resp. rate 18, height 5\' 3"  (1.6 m), weight 198 lb (89.812 kg), last menstrual period 03/26/2014, SpO2 99 %, unknown if currently breastfeeding.  Physical Exam:  General: alert and cooperative Lochia: appropriate Uterine Fundus: firm Incision: healing well DVT Evaluation: No evidence of DVT seen on physical exam.   Recent Labs  12/23/14 2156 12/26/14 0559  HGB 10.8* 8.0*  HCT 32.2* 23.9*  WBC 14.8* 19.1*  PLT 151 130*    Assessment/Plan: Plan for discharge tomorrow  A: 4th degree lac 2. Failed vacuum with forceps delivery 3. Midline epis extending to 4th degree with local for repair  P: Use Colace 100 mg po BID and Metamucil, Ducolax if needed 2. Ice to perineum due to extreme edema from delivery 3. Lactation to work with breastfeeding today 4. Anemia: Replace Fe   LOS: 3 days   Sharee PimpleCaron W Jones 12/26/2014, 7:40 AM

## 2014-12-26 NOTE — Progress Notes (Signed)
Pt complains of constant nagging headache, and now complains of blurred vision. Dr. Tildon HuskyHalfon notified and states she "will come evaluate patient" and consult to anesthesia. Dr. Norton BlizzardKaphart notified states "call CRNA and someone will follow-up with her tomorrow".

## 2014-12-26 NOTE — Anesthesia Postprocedure Evaluation (Signed)
  Anesthesia Post-op Note  Patient: Sonya [name]  Procedure(s) Performed: * No procedures listed *  Anesthesia type:Epidural  Patient location: Floor 337  Post pain: Pain level controlled  Post assessment: Post-op Vital signs reviewed, Patient's Cardiovascular Status Stable, Respiratory Function Stable, Patent Airway and No signs of Nausea or vomiting  Post vital signs: Reviewed and stable  Last Vitals:  Filed Vitals:   12/26/14 0425  BP: 125/70  Pulse: 98  Temp: 37 C  Resp: 20    Level of consciousness: awake, alert  and patient cooperative  Complications: No apparent anesthesia complications

## 2014-12-26 NOTE — Op Note (Signed)
NAME:  Sonya [name], Sonya [name]               ACCOUNT NO.:  [id number]  MEDICAL RECORD NO.:  [id number]  LOCATION:  LDR1                         FACILITY:  [facility]  PHYSICIAN:  Jennell Cornerhomas Lilith Solana, MDDATE OF BIRTH:  05-13-1990  DATE OF PROCEDURE: DATE OF DISCHARGE:                              OPERATIVE REPORT   PREOPERATIVE DIAGNOSIS: 1. 39+ 1 weeks estimated gestational age. 2. Arrest of descent.  POSTOPERATIVE DIAGNOSIS: 1. 39+ 1 weeks estimated gestational age. 2. Arrest of descent. 3. Fourth degree laceration.  PROCEDURE: 1. Simpson forceps. 2. Fourth degree perineal repair.  SURGEON:  Jennell Cornerhomas Laretta Pyatt, MD.  ANESTHESIA:  Continuous lumbar epidural and 1% lidocaine.  INDICATIONS:  A 24 year old, gravida 2, para 0 patient admitted 3 days prior to the delivery for preeclampsia.  The patient underwent a 3-day induction.  On the third day, the patient progressed to complete, and after 3 hours of pushing, was only able to bring the fetal head down to the +3 out of +3 station.  Certified nurse midwife, Yetta BarreJones, performed a vacuum attempt with 2 pop offs and had notified me and I promptly arrived and reassessed the situation.  The patient felt like she had adequate room and midline episiotomy was performed and an attempt with a repeat Kiwi vacuum with 2 additional pulls with 1 pop off, the fetal head did not progress much at all.  Simpson forceps were brought up to the delivery table and after informing the patient and the husband of the plan of the forceps delivery, they both agreed and the Simpson forceps were placed without difficulty.  Head was assessed to be in the OA presentation.  The forceps were placed without difficulty and with 2 pulls, the fetal head was delivered.  The forceps were removed and the shoulder and body was delivered without difficulty.  The infant girl was slightly floppy and was passed to the nursery staff who assigned Apgar scores of 6 and 8.  The fetal  weight 3720 g.  Placenta was delivered and it was noted that the patient had fourth degree perineal laceration. The rectal mucosa was reapproximated with 3-0 Vicryl suture with a 2 layer closure.  There was no suture identified in the rectal mucosa as deemed by digital exam.  The external sphincter, rectal sphincter was then grasped with 2 Allis clamps and the capsule was reapproximated with 3 separate figure-of-eight sutures using 2-0 Vicryl suture.  Good approximation of the sphincter.  The vaginal tissues were then repaired in a standard fashion with 2-0 Vicryl suture in a running, locking fashion with good approximation of edges.  There were no additional defects noted in the vagina.  The perineal body was brought together with a crown suture of 2-0 Vicryl suture and the skin was reapproximated with a running 3-0 subcuticular suture.  Good cosmetic effect was noted. Rectal exam again demonstrated no mucosal defects with good sphincter tone and the procedure was terminated. Estimated blood loss from delivery and repair approximately 350 mL.  The patient tolerated well.  There were no complications.  The patient's bladder was catheterized at the end of the case.          ______________________________ Jennell Cornerhomas Roslyn Else, MD  TS/MEDQ  D:  12/25/2014  T:  12/26/2014  Job:  [id number]

## 2014-12-27 LAB — PREPARE RBC (CROSSMATCH)

## 2014-12-27 LAB — URINALYSIS COMPLETE WITH MICROSCOPIC (ARMC ONLY)
BACTERIA UA: NONE SEEN
BILIRUBIN URINE: NEGATIVE
Glucose, UA: NEGATIVE mg/dL
Hgb urine dipstick: NEGATIVE
KETONES UR: NEGATIVE mg/dL
LEUKOCYTES UA: NEGATIVE
NITRITE: NEGATIVE
PROTEIN: NEGATIVE mg/dL
SPECIFIC GRAVITY, URINE: 1.009 (ref 1.005–1.030)
Squamous Epithelial / LPF: NONE SEEN
pH: 6 (ref 5.0–8.0)

## 2014-12-27 LAB — CBC
HCT: 21.6 % — ABNORMAL LOW (ref 35.0–47.0)
Hemoglobin: 7.3 g/dL — ABNORMAL LOW (ref 12.0–16.0)
MCH: 29.7 pg (ref 26.0–34.0)
MCHC: 33.6 g/dL (ref 32.0–36.0)
MCV: 88.4 fL (ref 80.0–100.0)
Platelets: 147 10*3/uL — ABNORMAL LOW (ref 150–440)
RBC: 2.45 MIL/uL — AB (ref 3.80–5.20)
RDW: 14.4 % (ref 11.5–14.5)
WBC: 15 10*3/uL — AB (ref 3.6–11.0)

## 2014-12-27 LAB — PROTEIN / CREATININE RATIO, URINE
Creatinine, Urine: 45 mg/dL
Total Protein, Urine: <6 mg/dL

## 2014-12-27 MED ORDER — MAGNESIUM SULFATE 50 % IJ SOLN
2.0000 g/h | INTRAVENOUS | Status: DC
  Administered 2014-12-27: 2 g/h via INTRAVENOUS

## 2014-12-27 MED ORDER — MAGNESIUM SULFATE 4 GM/100ML IV SOLN
INTRAVENOUS | Status: AC
  Filled 2014-12-27: qty 100

## 2014-12-27 MED ORDER — SODIUM CHLORIDE 0.9 % IV SOLN
Freq: Once | INTRAVENOUS | Status: AC
  Administered 2014-12-27: 11:00:00 via INTRAVENOUS

## 2014-12-27 MED ORDER — MAGNESIUM SULFATE BOLUS VIA INFUSION
4.0000 g | Freq: Once | INTRAVENOUS | Status: AC
  Administered 2014-12-27: 4 g via INTRAVENOUS
  Filled 2014-12-27: qty 500

## 2014-12-27 MED ORDER — MAGNESIUM SULFATE 40 G IN LACTATED RINGERS - SIMPLE
INTRAVENOUS | Status: AC
  Filled 2014-12-27: qty 500

## 2014-12-27 MED ORDER — SODIUM CHLORIDE 0.9 % IJ SOLN
INTRAMUSCULAR | Status: AC
  Filled 2014-12-27: qty 6

## 2014-12-27 NOTE — Progress Notes (Addendum)
OB ATTENDING NOTE  Called by RN for worsening blurry vision in patient. Patient was evaluated earlier this evening for new onset blurry vision and mild headache. Ruled in for preeclampsia without severe features and was induced for that reason. Did not receive magnesium. She describes the headache as mild, frontal.   O: Filed Vitals:   12/26/14 1640 12/26/14 1924 12/26/14 2354 12/27/14 0130  BP: 134/80 132/77 152/91 147/84  Pulse: 95 99 107 99  Temp: 98.1 F (36.7 C) 98.5 F (36.9 C) 98.3 F (36.8 C) 97.9 F (36.6 C)  TempSrc: Oral Oral Oral Oral  Resp: 18 24 20 18   Height:      Weight:      SpO2: 100% 97% 100% 98%   GEN: uncomfortable, no acute distress EXT: 1+ pitting edema LE b/l Foley to gravity, clear yellow urine Perineum: minimal swelling, L>R  Labs: CBC Latest Ref Rng 12/26/2014 12/26/2014 12/23/2014  WBC 3.6 - 11.0 K/uL 15.0(H) 19.1(H) 14.8(H)  Hemoglobin 12.0 - 16.0 g/dL 7.3(L) 8.0(L) 10.8(L)  Hematocrit 35.0 - 47.0 % 21.6(L) 23.9(L) 32.2(L)  Platelets 150 - 440 K/uL 147(L) 130(L) 151   A/P: 23yo G2P2 s/p FAVD 10/20 11pm after IOL for PEC w/o severe features, now with features concerning for neurologic involvement. Will start magnesium for seizure prophylaxsis. Will reduce fluids overall given edematous state and concern for fluid overload. Will check P:C ratio to evaluate progression of PEC. If blurry vision still persistent after magnesium, will consult Neurology tomorrow.   Patient also with downtrending Hb. No signs/symptoms of acute blood loss anemia at this time. Will continue to trend h/h to rule out occult bleed.   - mag 4g load, 2g/hr maintenance - P:C - cbc in AM - strict I/O - reduce maintenance to 75cc/hr  Transfer to Labor and Delivery for magnesium administration for 24hours  Ala DachJohanna K Claribel Sachs, MD

## 2014-12-27 NOTE — Progress Notes (Addendum)
Anesthesiology... Asked to evaluate a patient who has already delivered  With a lingering weakness noted in the lower left extremity and on and off headache.  The patient did have an uncomplicated epidural and is now being treated for pregnancy induced hypertension/ ? preeclamsia with Mag.      Upon evaluation, the left lower extremity shows only some decrease strength in the plantar flexion vs. Dorsiflexion, with normal quad strength and sensation.  The patient reports some decreased sensation to the mid calf to foot on the left.  Apparently, this is much better than yesterday as she had much difficulty moving the entire leg.      Impression:  Resolving weakness to the left lower extremity.  Weakness may have been related to prolonged labor and pushing for 4 hours, position during the laboring process and may be residual meds early on , but he med effects should be gone..No results found for this or any previous visit. Has also been contacted.   Will observe for further resolution.

## 2014-12-27 NOTE — Progress Notes (Signed)
OB ATTENDING NOTE  S: Pt still with blurry vision. HA improved. Feeling ok on magnesium. Still c/o left leg numbness, especially at the left foot. No n/v. Still with significant perineal pain. Tolerating PO percocet and motrin well. Breast feeding without difficulty.  Ceasar Mons: Filed Vitals:   12/27/14 0817 12/27/14 0850 12/27/14 0957 12/27/14 1020  BP: 124/77  141/86   Pulse: 112  116   Temp:  98.2 F (36.8 C)    TempSrc:  Oral    Resp: 18 18    Height:      Weight:      SpO2:    97%   GEN: resting, no acute distress CV: PULM: ABD: firm fundus, soft, NT VULVA: 2cm Left labial swelling, well-circumscribed, non-expanding, perineum intact, no erythema, edema EXT: symmetric LE, 1+ pitting edema in feet, cooler to touch in left than right. 2/5 strength LLE, 4/5 RLE; neg homan's sign     CMP Latest Ref Rng 12/23/2014 10/13/2014 06/14/2014  Glucose 65 - 99 mg/dL 80 88 95  BUN 6 - 20 mg/dL 10 7 6   Creatinine 0.44 - 1.00 mg/dL 4.09(W0.38(L) 1.19(J0.36(L) 4.78(G0.36(L)  Sodium 135 - 145 mmol/L 140 132(L) 137  Potassium 3.5 - 5.1 mmol/L 3.6 3.3(L) 3.4(L)  Chloride 101 - 111 mmol/L 109 100(L) 109  CO2 22 - 32 mmol/L 24 20(L) 22  Calcium 8.9 - 10.3 mg/dL 9.0 9.1 9.2  Total Protein 6.5 - 8.1 g/dL 9.5(A5.8(L) 6.8 6.9  Total Bilirubin 0.3 - 1.2 mg/dL <2.1(H<0.1(L) <0.1(L) 0.4  Alkaline Phos 38 - 126 U/L 120 66 38  AST 15 - 41 U/L 20 17 19   ALT 14 - 54 U/L 12(L) 12(L) 16   CBC Latest Ref Rng 12/26/2014 12/26/2014 12/23/2014  WBC 3.6 - 11.0 K/uL 15.0(H) 19.1(H) 14.8(H)  Hemoglobin 12.0 - 16.0 g/dL 7.3(L) 8.0(L) 10.8(L)  Hematocrit 35.0 - 47.0 % 21.6(L) 23.9(L) 32.2(L)  Platelets 150 - 440 K/uL 147(L) 130(L) 151   P:C ratio unable to be calculated given low creatinine UA neg for protein   A/P:  23yo G2P2 s/p FAVD 10/20 11pm after IOL for PEC w/o severe features, new onset blurry vision and persistent headache last night, concern for PEC w/ severe features. Magnesium started for prophylaxis against seizures. P:C ratio and  labs wnl. Headache improved with diuresis. Will cont the 24hours of magnesium. BP's well controlled.   Given persistent blurry vision, neurology curbsided (Dr. Pauletta BrownsYuriy Zeylikman). Per consult, if patient's blurry vision persists for another 24hours, pursue MRI/MRV to evaluate for Reversible cerebral vasoconstriction syndromes (RCVS) versus Posterior reversible encephalopathy syndrome (PRES) versus Cerebral venous sinus thrombosis (CVT).   Also with acute blood loss anemia and tachycardia to 110's. Will transfuse 2U PRBC with careful monitoring of fluid status.   1. New onset blurry vision - ddx pre-eclampsia w/ severe features, RCVS, PRES, CVT - PEC ppx - mag 2g/hr maintenance until 2 am Sunday - If blurry vision persists Sunday or worsens, obtain MRI/MRV to evaluate RCVS, PRES, CVT - BP control - if elevated BP will use nifedipine as it reduces vasospasm  2. Acute blood loss anemia - transfuse 2U PRBC  - strict I/O - reduce maintenance to 50cc/hr during transfusion to avoid fluid overload  3. LLE numbness after epidural - will have anesthesia come evaluate - likely delayed return of sensation due to epidural placement (L>R) and reduced mobility due to 4th degree and mag  4. 4th degree - continue ice packs and colace  Sonya DachJohanna K Aedon Deason, MD

## 2014-12-27 NOTE — Addendum Note (Signed)
Addendum  created 12/27/14 1256 by Yves DillPaul Neya Creegan, MD   Modules edited: Clinical Notes   Clinical Notes:  File: [id number]; File: [id number]; Pend: [id number]; Pend: [id number]; Pend: [id number]; Pend: [id number]

## 2014-12-28 DIAGNOSIS — H538 Other visual disturbances: Secondary | ICD-10-CM

## 2014-12-28 LAB — CBC WITH DIFFERENTIAL/PLATELET
BASOS ABS: 0.1 10*3/uL (ref 0–0.1)
EOS ABS: 0.3 10*3/uL (ref 0–0.7)
HCT: 28 % — ABNORMAL LOW (ref 35.0–47.0)
Hemoglobin: 9.3 g/dL — ABNORMAL LOW (ref 12.0–16.0)
Lymphocytes Relative: 18 %
Lymphs Abs: 2.8 10*3/uL (ref 1.0–3.6)
MCH: 28.2 pg (ref 26.0–34.0)
MCHC: 33.1 g/dL (ref 32.0–36.0)
MCV: 85.4 fL (ref 80.0–100.0)
Monocytes Absolute: 0.6 10*3/uL (ref 0.2–0.9)
Monocytes Relative: 4 %
Neutro Abs: 11.8 10*3/uL — ABNORMAL HIGH (ref 1.4–6.5)
PLATELETS: 183 10*3/uL (ref 150–440)
RBC: 3.28 MIL/uL — AB (ref 3.80–5.20)
RDW: 15.6 % — AB (ref 11.5–14.5)
WBC: 15.4 10*3/uL — AB (ref 3.6–11.0)

## 2014-12-28 LAB — TYPE AND SCREEN
ABO/RH(D): O POS
Antibody Screen: NEGATIVE
UNIT DIVISION: 0
Unit division: 0

## 2014-12-28 MED ORDER — OXYCODONE-ACETAMINOPHEN 5-325 MG PO TABS: 1.0000 | ORAL_TABLET | ORAL | Status: DC | PRN

## 2014-12-28 MED ORDER — SODIUM CHLORIDE 0.9 % IJ SOLN
INTRAMUSCULAR | Status: AC
Start: 2014-12-28 — End: 2014-12-28
  Filled 2014-12-28: qty 3

## 2014-12-28 NOTE — Progress Notes (Signed)
OB ATTENDING NOTE  PPD#2   Overnight events: s/p 2U PRBC, magnesium d/c'd, foley d/c'd, pt ambulating w/o difficulty  S: Blurry vision almost resolved this morning. No further headaches. Left leg numbness improving. No n/v. Still with significant perineal pain, improved with PO percocet and motrin. Breast feeding without difficulty. Bowel movement yesterday without straining. Tolerating regular diet.   Ceasar Mons: Filed Vitals:   12/28/14 0745 12/28/14 0750 12/28/14 0755 12/28/14 1004  BP:    134/87  Pulse:    95  Temp:    97.7 F (36.5 C)  TempSrc:    Oral  Resp:    18  Height:      Weight:      SpO2: 98% 98% 98% 96%   GEN: resting, no acute distress CV: RRR PULM: CTAB ABD: firm fundus, soft, NT VULVA: perineum intact, no erythema, edema EXT: symmetric LE, 1+ pitting edema in feet, 5/5 strength bilaterally   CBC Latest Ref Rng 12/27/2014 12/26/2014 12/26/2014  WBC 3.6 - 11.0 K/uL 15.4(H) 15.0(H) 19.1(H)  Hemoglobin 12.0 - 16.0 g/dL 5.6(O9.3(L) 7.3(L) 8.0(L)  Hematocrit 35.0 - 47.0 % 28.0(L) 21.6(L) 23.9(L)  Platelets 150 - 440 K/uL 183 147(L) 130(L)     A/P:  23yo G2P2 s/p FAVD 10/20 11pm after IOL for PEC w/o severe features, new onset blurry vision on PPD#1 and persistent headache, concern for PEC w/ severe features. S/p Magnesium for prophylaxis against seizures. Bp's mild range. Blurry vision now improved. Reassuring. Also w/ 4th degree laceration, improving, s/p 2U PRBC for acute blood loss anemia.   1. New onset blurry vision, improving - PEC ppx - s/p mag PPD#1  2. Acute blood loss anemia - s/p 2U PRBC  - appropriate rise of hb  3. LLE numbness after epidural, improving - likely delayed return of sensation due to epidural placement (L>R) and reduced mobility due to 4th degree and mag - cont to ambulate as tolerated  4. 4th degree - continue ice packs and colace  5. Routine OB - considering contraception options - breast feeding - PNV  Anticipate d/c  PPD#3  Ala DachJohanna K Rodrick Payson, MD

## 2014-12-28 NOTE — Progress Notes (Signed)
Pt. Resting with no current concerns. SO at the bedside along with infant baby. Baby in open bassinet.

## 2014-12-28 NOTE — Consult Note (Signed)
CC: blurry vision  HPI: Sonya [name] is an 24 y.o. female s/p vaginal delivery. Complaining of blurry vision and headache.   History reviewed. No pertinent past medical history.  History reviewed. No pertinent past surgical history.  History reviewed. No pertinent family history.  Social History:  reports that she has quit smoking. She has never used smokeless tobacco. She reports that she does not drink alcohol or use illicit drugs.  Allergies  Allergen Reactions  . Codeine Nausea And Vomiting    Medications: I have reviewed the patient's current medications.  ROS: History obtained from the patient  General ROS: negative for - chills, fatigue, fever, night sweats, weight gain or weight loss Psychological ROS: negative for - behavioral disorder, hallucinations, memory difficulties, mood swings or suicidal ideation Ophthalmic ROS: negative for - blurry vision, double vision, eye pain or loss of vision ENT ROS: negative for - epistaxis, nasal discharge, oral lesions, sore throat, tinnitus or vertigo Allergy and Immunology ROS: negative for - hives or itchy/watery eyes Hematological and Lymphatic ROS: negative for - bleeding problems, bruising or swollen lymph nodes Endocrine ROS: negative for - galactorrhea, hair pattern changes, polydipsia/polyuria or temperature intolerance Respiratory ROS: negative for - cough, hemoptysis, shortness of breath or wheezing Cardiovascular ROS: negative for - chest pain, dyspnea on exertion, edema or irregular heartbeat Gastrointestinal ROS: negative for - abdominal pain, diarrhea, hematemesis, nausea/vomiting or stool incontinence Genito-Urinary ROS: negative for - dysuria, hematuria, incontinence or urinary frequency/urgency Musculoskeletal ROS: negative for - joint swelling or muscular weakness Neurological ROS: as noted in HPI Dermatological ROS: negative for rash and skin lesion changes  Physical Examination: Blood pressure 134/87, pulse  95, temperature 97.7 F (36.5 C), temperature source Oral, resp. rate 18, height  (1.6 m), weight 198 lb (89.812 kg), last menstrual period 03/26/2014, SpO2 96 %, unknown if currently breastfeeding.   Neurological Examination Mental Status: Alert, oriented, thought content appropriate.  Speech fluent without evidence of aphasia.  Able to follow 3 step commands without difficulty. Cranial Nerves: II: Discs flat bilaterally; Visual fields grossly normal, pupils equal, round, reactive to light and accommodation III,IV, VI: ptosis not present, extra-ocular motions intact bilaterally V,VII: smile symmetric, facial light touch sensation normal bilaterally VIII: hearing normal bilaterally IX,X: gag reflex present XI: bilateral shoulder shrug XII: midline tongue extension Motor: Right : Upper extremity   5/5    Left:     Upper extremity   5/5  Lower extremity   5/5     Lower extremity   5/5, 4+distal plantar and dorsi flexion Tone and bulk:normal tone throughout; no atrophy noted Sensory: Slight decreased sensation LE.  Deep Tendon Reflexes: 2+ and symmetric throughout Plantars: Right: downgoing   Left: downgoing Cerebellar: normal finger-to-nose, normal rapid alternating movements and normal heel-to-shin test Gait: not tested.         Laboratory Studies:   Basic Metabolic Panel:  Recent Labs Lab 12/23/14 2156  NA 140  K 3.6  CL 109  CO2 24  GLUCOSE 80  BUN 10  CREATININE 0.38*  CALCIUM 9.0    Liver Function Tests:  Recent Labs Lab 12/23/14 2156  AST 20  ALT 12*  ALKPHOS 120  BILITOT <0.1*  PROT 5.8*  ALBUMIN 2.7*   No results for input(s): LIPASE, AMYLASE in the last 168 hours. No results for input(s): AMMONIA in the last 168 hours.  CBC:  Recent Labs Lab 12/23/14 2156 12/26/14 0559 12/26/14 2255 12/27/14 2352  WBC 14.8* 19.1* 15.0* 15.4*  NEUTROABS  --   --   --  11.8*  HGB 10.8* 8.0* 7.3* 9.3*  HCT 32.2* 23.9* 21.6* 28.0*  MCV 87.0 87.7 88.4  85.4  PLT 151 130* 147* 183    Cardiac Enzymes: No results for input(s): CKTOTAL, CKMB, CKMBINDEX, TROPONINI in the last 168 hours.  BNP: Invalid input(s): POCBNP  CBG: No results for input(s): GLUCAP in the last 168 hours.  Microbiology: Results for orders placed or performed during the hospital encounter of 11/26/14  Wet prep, genital     Status: Abnormal   Collection Time: 11/26/14  3:17 PM  Result Value Ref Range Status   Yeast Wet Prep HPF POC POSITIVE (A) NONE SEEN Final   Trich, Wet Prep NONE SEEN NONE SEEN Final   Clue Cells Wet Prep HPF POC NONE SEEN NONE SEEN Final   WBC, Wet Prep HPF POC MODERATE (A) NONE SEEN Final  Chlamydia/NGC rt PCR ([facility] only)     Status: None   Collection Time: 11/26/14  3:17 PM  Result Value Ref Range Status   Specimen source GC/Chlam ENDOCERVICAL  Final   Chlamydia Tr NOT DETECTED NOT DETECTED Final   N gonorrhoeae NOT DETECTED NOT DETECTED Final    Comment: (NOTE) 100  This methodology has not been evaluated in pregnant women or in 200  patients with a history of hysterectomy. 300 400  This methodology will not be performed on patients less than 7414  years of age.     Coagulation Studies: No results for input(s): LABPROT, INR in the last 72 hours.  Urinalysis:  Recent Labs Lab 12/27/14 0421  COLORURINE STRAW*  LABSPEC 1.009  PHURINE 6.0  GLUCOSEU NEGATIVE  HGBUR NEGATIVE  BILIRUBINUR NEGATIVE  KETONESUR NEGATIVE  PROTEINUR NEGATIVE  NITRITE NEGATIVE  LEUKOCYTESUR NEGATIVE    Lipid Panel:  No results found for: CHOL, TRIG, HDL, CHOLHDL, VLDL, LDLCALC  HgbA1C: No results found for: HGBA1C  Urine Drug Screen:     Component Value Date/Time   LABOPIA NEGATIVE 06/05/2013 0927   LABBENZ NEGATIVE 06/05/2013 0927   AMPHETMU NEGATIVE 06/05/2013 0927   THCU NEGATIVE 06/05/2013 0927   LABBARB NEGATIVE 06/05/2013 0927    Alcohol Level: No results for input(s): ETH in the last 168 hours.  Other results: EKG: normal EKG,  normal sinus rhythm, unchanged from previous tracings, normal sinus rhythm.  Imaging: No results found.   Assessment/Plan:  24 y.o. female s/p vaginal delivery. Complaining of blurry vision and headache.   Pt's Headache has nearly resolved as well as the blurry vision. She is nearly back to baseline.  Still complaining of slight numbness of the left lower extremity which is improving likely due to neuropraxia from delivery.    Do no think there is a need for any further imaging from neuro stand point.   D/c planning as per primary team.   Please call with questions.   12/28/2014, 10:24 AM

## 2014-12-29 MED ORDER — DOCUSATE SODIUM 100 MG PO CAPS: 100.0000 mg | ORAL_CAPSULE | Freq: Two times a day (BID) | ORAL | Status: DC

## 2014-12-29 NOTE — Progress Notes (Signed)
All discharge instructions given to patient and she voiced understanding of all instructions given.  She will make her own f/u appt. Prescriptions given.

## 2014-12-29 NOTE — Progress Notes (Signed)
Patient discharged  home with spouse and infant escorted out by [place] in [place]

## 2014-12-29 NOTE — Discharge Instructions (Signed)
Vaginal Laceration A vaginal laceration is a tear in the vaginal wall. A vaginal tear falls into one of three categories:   Obstetrically related tears that occur at the time of childbirth.  Trauma-related tears (most often related to sexual intercourse).  Spontaneous tears. Vaginal tears can cause heavy bleeding (hemorrhaging) depending on severity of the tear. Tears can be intensely tender and interfere with normal activities of living. They can make sexual intercourse painful and bring on significant burning with urination. If you have a vaginal laceration, the area around your vagina may be painful when you touch or wipe it. Even light pressure from clothing may cause some pain.  HOME CARE INSTRUCTIONS   Take warm-water baths that cover your hips and buttocks (sitz bath) 2 to 3 times a day. This may help any discomfort and swelling.   Only take over-the-counter or prescription medicines for pain, discomfort, or fever as directed by your caregiver. Do not use aspirin because it can cause increased bleeding.   Do not douche, use tampons, or have intercourse until your caregiver says it is okay.   Apply ice or witch hazel pads to the vagina to lessen any pain or discomfort.   Take a stool softener twice a day.  This will help ease discomfort associated with bowel movements.  SEEK IMMEDIATE MEDICAL CARE IF:   You have redness or swelling in the vaginal area.   You have increasing, sharp, or intense pain or tenderness in the vaginal area.  You have pus or unusual discharge coming from the tear or vagina.   You notice a bad smell coming from the vagina.   Your tear breaks open after it healed or was repaired.   You feel lightheaded.  You have increasing abdominal pain.   You have an increasing or heavy amount of vaginal bleeding.   You have pain with intercourse after the tear heals.  MAKE SURE YOU:  Understand these instructions.  Will watch your  condition.  Will get help right away if you are not doing well or get worse.   This information is not intended to replace advice given to you by your health care provider. Make sure you discuss any questions you have with your health care provider.   Document Released: 02/21/2005 Document Revised: 11/16/2011 Document Reviewed: 07/11/2011 Elsevier Interactive Patient Education 2016 [employer]  Preeclampsia with severe features Preeclampsia is a serious condition that develops only during pregnancy. This condition causes high blood pressure along with other symptoms, such as swelling and headaches or other neurological findings such as blurry vision. These may develop as the condition gets worse.  One problem it can lead to is eclampsia, which is a condition that causes muscle jerking or shaking (convulsions) in the mother. Delivering your baby is the best treatment for preeclampsia or eclampsia. You may need to stay in the hospital if your condition is severe. There, treatment will focus on controlling your blood pressure and fluid retention. You may also need to take medicine to prevent seizures.Preeclampsia usually goes away after the baby is born. SEEK MEDICAL CARE IF:  You are gaining more weight than expected.  You have any headaches, abdominal pain, or nausea.  You are bruising more than usual.  You feel dizzy or light-headed. SEEK IMMEDIATE MEDICAL CARE IF:   You develop sudden or severe swelling anywhere in your body. This usually happens in the legs.  You gain 5 lb (2.3 kg) or more in a week.  You have a severe  headache, dizziness, problems with your vision, or confusion.  You have severe abdominal pain.  You have lasting nausea or vomiting.  You have a seizure.  You have trouble moving any part of your body.  You develop numbness in your body.  You have trouble speaking.  You have any abnormal bleeding.  You develop a stiff neck.  You pass out. MAKE SURE  YOU:   Understand these instructions.  Will watch your condition.  Will get help right away if you are not doing well or get worse.   This information is not intended to replace advice given to you by your health care provider. Make sure you discuss any questions you have with your health care provider.   Document Released: 02/19/2000 Document Revised: 02/26/2013 Document Reviewed: 12/14/2012 Elsevier Interactive Patient Education 2016 [employer]. Follow up sooner with fever, problems breathing, pain not helped by medications, severe depression( more than just baby blues, wanting to hurt yourself or the baby), severe bleeding ( saturating more than one pad an hour or large palm sized clots), no heavy lifting , no driving while taking narcotics, no douches, intercourse, tampons or enemas for 6 weeks Follow up sooner with fever, problems breathing, pain not helped by medications, severe depression( more than just baby blues, wanting to hurt yourself or the baby), severe bleeding ( saturating more than one pad an hour or large palm sized clots), no heavy lifting , no driving while taking narcotics, no douches, intercourse, tampons or enemas for 6 weeks

## 2014-12-29 NOTE — Discharge Summary (Signed)
Obstetric Discharge Summary   Patient ID: Nance PearSummer Lemus MRN: [id number] DOB/AGE: 23/03/1990 23 y.o.   Date of Admission: 12/23/2014  Date of Discharge: 12/29/14  Admitting Diagnosis: Induction of labor at 7329w1d  Secondary Diagnosis: Preeclampsia  Mode of Delivery: forceps delivery     Discharge Diagnosis: Neuropraxia of Lt foot, pre-ecclampsia requiring MagS04 after delivery   Intrapartum Procedures: epidural, IUPC, PItocin, Failed vacuum, forceps   Post partum procedures: MagSo4, Neuro consult for HA, blurred vision, Lt foot numbness  Complications: 4th degree perineal laceration   Brief Hospital Course  Forceps delivery  on 12/25/14  for further details of this delivery, please refer to the delivery note.  Patient had a complicated postpartum course.  By time of discharge on PPD#3 her pain was controlled on oral pain medications; she had appropriate lochia and was ambulating, voiding without difficulty and tolerating regular diet. She resolved her HA and blurry vision. She still had some numbness in her Lt foot on top from the area sl above ankle down to top of foot.  She was deemed stable for discharge to home.     Labs: CBC Latest Ref Rng 12/27/2014 12/26/2014 12/26/2014  WBC 3.6 - 11.0 K/uL 15.4(H) 15.0(H) 19.1(H)  Hemoglobin 12.0 - 16.0 g/dL 7.8(G9.3(L) 7.3(L) 8.0(L)  Hematocrit 35.0 - 47.0 % 28.0(L) 21.6(L) 23.9(L)  Platelets 150 - 440 K/uL 183 147(L) 130(L)   O POS  Physical exam:  Blood pressure 130/73, pulse 73, temperature 98.2 F (36.8 C), temperature source Oral, resp. rate 20, height 5\' 3"  (1.6 m), weight 198 lb (89.812 kg), last menstrual period 03/26/2014, SpO2 99 %, unknown if currently breastfeeding. General: alert and no distress Lochia: appropriate Abdomen: soft, NT Uterine Fundus: firm, U-2.  Perineal Incision: healing well, no significant drainage, no dehiscence, no significant erythema Extremities: No evidence of DVT seen on physical exam. 2+  lower  extremity edema. Can move Lt foot well. C/O "numbness on top of Lt foot". +pulses DP and PT. Lt foot is warm to touch. Pt has sensation to touch but, not at 100%.   Discharge Instructions: Per After Visit Summary. Activity: Advance as tolerated. Pelvic rest for 6 weeks.  Also refer to After Visit Summary Diet: Regular Medications:   Medication List    TAKE these medications        oxyCODONE-acetaminophen 5-325 MG tablet  Commonly known as:  ROXICET  Take 1 tablet by mouth every 4 (four) hours as needed for severe pain (TAKE WITH STOOL SOFTENERS).      ASK your doctor about these medications        cyclobenzaprine 10 MG tablet  Commonly known as:  FLEXERIL  Take 10 mg by mouth 3 (three) times daily as needed for muscle spasms (at night PRN).     famotidine 40 MG tablet  Commonly known as:  PEPCID  Take 40 mg by mouth at bedtime.     ferrous fumarate 325 (106 FE) MG Tabs tablet  Commonly known as:  HEMOCYTE - 106 mg FE  Take 1 tablet by mouth daily.     ondansetron 4 MG tablet  Commonly known as:  ZOFRAN  Take 4 mg by mouth every 8 (eight) hours as needed for nausea or vomiting.     prenatal multivitamin Tabs tablet  Take 1 tablet by mouth daily at 12 noon.       Outpatient follow up:      Follow-up Information    Follow up with SCHERMERHORN,[name], MD In 2 weeks.  Specialty:  Obstetrics and Gynecology   Why:  perineal check after 4th degree   Contact information:   [address] [place] [address] [phone]       Follow up with Jennell Corner, MD In 6 weeks.   Specialty:  Obstetrics and Gynecology   Why:  PP visit   Contact information:   [address] [place] [address] [phone]      Postpartum contraception: no method  Discharged Condition: stable  Discharged to: home   Newborn Data:  Baby Girl  named "Jonna Clark"  Disposition:home with mother  Apgars:  APGAR (1 MIN): 6   APGAR (5 MINS): 8   APGAR (10 MINS):    Baby Feeding: Breast  FU with C. Jones in 1 week to check on Lt foot numbness.   Sharee Pimple, CNM 12/29/2014

## 2015-01-05 ENCOUNTER — Encounter: Payer: Self-pay | Admitting: Obstetrics & Gynecology

## 2015-01-05 ENCOUNTER — Other Ambulatory Visit: Payer: Self-pay | Admitting: Obstetrics & Gynecology

## 2015-01-22 ENCOUNTER — Emergency Department
Admission: EM | Admit: 2015-01-22 | Discharge: 2015-01-22 | Disposition: A | Discharge status: Home / Self Care | Payer: 59 | Attending: Emergency Medicine | Admitting: Emergency Medicine

## 2015-01-22 ENCOUNTER — Encounter: Payer: Self-pay | Admitting: Emergency Medicine

## 2015-01-22 ENCOUNTER — Emergency Department: Payer: 59

## 2015-01-22 DIAGNOSIS — Z87891 Personal history of nicotine dependence: Secondary | ICD-10-CM | POA: Insufficient documentation

## 2015-01-22 DIAGNOSIS — Z79899 Other long term (current) drug therapy: Secondary | ICD-10-CM | POA: Insufficient documentation

## 2015-01-22 DIAGNOSIS — S139XXA Sprain of joints and ligaments of unspecified parts of neck, initial encounter: Secondary | ICD-10-CM

## 2015-01-22 DIAGNOSIS — Y998 Other external cause status: Secondary | ICD-10-CM | POA: Diagnosis not present

## 2015-01-22 DIAGNOSIS — S0990XA Unspecified injury of head, initial encounter: Secondary | ICD-10-CM | POA: Diagnosis not present

## 2015-01-22 DIAGNOSIS — Y92410 Unspecified street and highway as the place of occurrence of the external cause: Secondary | ICD-10-CM | POA: Insufficient documentation

## 2015-01-22 DIAGNOSIS — Y9389 Activity, other specified: Secondary | ICD-10-CM | POA: Diagnosis not present

## 2015-01-22 DIAGNOSIS — S134XXA Sprain of ligaments of cervical spine, initial encounter: Secondary | ICD-10-CM | POA: Insufficient documentation

## 2015-01-22 DIAGNOSIS — S199XXA Unspecified injury of neck, initial encounter: Secondary | ICD-10-CM | POA: Diagnosis present

## 2015-01-22 MED ORDER — IBUPROFEN 800 MG PO TABS: 800.0000 mg | ORAL_TABLET | Freq: Three times a day (TID) | ORAL | Status: DC | PRN

## 2015-01-22 MED ORDER — CYCLOBENZAPRINE HCL 10 MG PO TABS: 10.0000 mg | ORAL_TABLET | Freq: Three times a day (TID) | ORAL | Status: DC | PRN

## 2015-01-22 MED ORDER — HYDROCODONE-ACETAMINOPHEN 5-325 MG PO TABS: 1.0000 | ORAL_TABLET | ORAL | Status: DC | PRN
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
## 2015-01-22 NOTE — Discharge Instructions (Signed)
Cervical Strain and Sprain With Rehab  Cervical strain and sprain are injuries that commonly occur with "whiplash" injuries. Whiplash occurs when the neck is forcefully whipped backward or forward, such as during a motor vehicle accident or during contact sports. The muscles, ligaments, tendons, discs, and nerves of the neck are susceptible to injury when this occurs.  RISK FACTORS  Risk of having a whiplash injury increases if:  · Osteoarthritis of the spine.  · Situations that make head or neck accidents or trauma more likely.  · High-risk sports (football, rugby, wrestling, hockey, auto racing, gymnastics, diving, contact karate, or boxing).  · Poor strength and flexibility of the neck.  · Previous neck injury.  · Poor tackling technique.  · Improperly fitted or padded equipment.  SYMPTOMS   · Pain or stiffness in the front or back of neck or both.  · Symptoms may present immediately or up to 24 hours after injury.  · Dizziness, headache, nausea, and vomiting.  · Muscle spasm with soreness and stiffness in the neck.  · Tenderness and swelling at the injury site.  PREVENTION  · Learn and use proper technique (avoid tackling with the head, spearing, and head-butting; use proper falling techniques to avoid landing on the head).  · Warm up and stretch properly before activity.  · Maintain physical fitness:    Strength, flexibility, and endurance.    Cardiovascular fitness.  · Wear properly fitted and padded protective equipment, such as padded soft collars, for participation in contact sports.  PROGNOSIS   Recovery from cervical strain and sprain injuries is dependent on the extent of the injury. These injuries are usually curable in 1 week to 3 months with appropriate treatment.   RELATED COMPLICATIONS   · Temporary numbness and weakness may occur if the nerve roots are damaged, and this may persist until the nerve has completely healed.  · Chronic pain due to frequent recurrence of symptoms.  · Prolonged healing,  especially if activity is resumed too soon (before complete recovery).  TREATMENT   Treatment initially involves the use of ice and medication to help reduce pain and inflammation. It is also important to perform strengthening and stretching exercises and modify activities that worsen symptoms so the injury does not get worse. These exercises may be performed at home or with a therapist. For patients who experience severe symptoms, a soft, padded collar may be recommended to be worn around the neck.   Improving your posture may help reduce symptoms. Posture improvement includes pulling your chin and abdomen in while sitting or standing. If you are sitting, sit in a firm chair with your buttocks against the back of the chair. While sleeping, try replacing your pillow with a small towel rolled to 2 inches in diameter, or use a cervical pillow or soft cervical collar. Poor sleeping positions delay healing.   For patients with nerve root damage, which causes numbness or weakness, the use of a cervical traction apparatus may be recommended. Surgery is rarely necessary for these injuries. However, cervical strain and sprains that are present at birth (congenital) may require surgery.  MEDICATION   · If pain medication is necessary, nonsteroidal anti-inflammatory medications, such as aspirin and ibuprofen, or other minor pain relievers, such as acetaminophen, are often recommended.  · Do not take pain medication for 7 days before surgery.  · Prescription pain relievers may be given if deemed necessary by your caregiver. Use only as directed and only as much as you need.    HEAT AND COLD:   · Cold treatment (icing) relieves pain and reduces inflammation. Cold treatment should be applied for 10 to 15 minutes every 2 to 3 hours for inflammation and pain and immediately after any activity that aggravates your symptoms. Use ice packs or an ice massage.  · Heat treatment may be used prior to performing the stretching and  strengthening activities prescribed by your caregiver, physical therapist, or athletic trainer. Use a heat pack or a warm soak.  SEEK MEDICAL CARE IF:   · Symptoms get worse or do not improve in 2 weeks despite treatment.  · New, unexplained symptoms develop (drugs used in treatment may produce side effects).  EXERCISES  RANGE OF MOTION (ROM) AND STRETCHING EXERCISES - Cervical Strain and Sprain  These exercises may help you when beginning to rehabilitate your injury. In order to successfully resolve your symptoms, you must improve your posture. These exercises are designed to help reduce the forward-head and rounded-shoulder posture which contributes to this condition. Your symptoms may resolve with or without further involvement from your physician, physical therapist or athletic trainer. While completing these exercises, remember:   · Restoring tissue flexibility helps normal motion to return to the joints. This allows healthier, less painful movement and activity.  · An effective stretch should be held for at least 20 seconds, although you may need to begin with shorter hold times for comfort.  · A stretch should never be painful. You should only feel a gentle lengthening or release in the stretched tissue.  STRETCH- Axial Extensors  · Lie on your back on the floor. You may bend your knees for comfort. Place a rolled-up hand towel or dish towel, about 2 inches in diameter, under the part of your head that makes contact with the floor.  · Gently tuck your chin, as if trying to make a "double chin," until you feel a gentle stretch at the base of your head.  · Hold __________ seconds.  Repeat __________ times. Complete this exercise __________ times per day.   STRETCH - Axial Extension   · Stand or sit on a firm surface. Assume a good posture: chest up, shoulders drawn back, abdominal muscles slightly tense, knees unlocked (if standing) and feet hip width apart.  · Slowly retract your chin so your head slides back  and your chin slightly lowers. Continue to look straight ahead.  · You should feel a gentle stretch in the back of your head. Be certain not to feel an aggressive stretch since this can cause headaches later.  · Hold for __________ seconds.  Repeat __________ times. Complete this exercise __________ times per day.  STRETCH - Cervical Side Bend   · Stand or sit on a firm surface. Assume a good posture: chest up, shoulders drawn back, abdominal muscles slightly tense, knees unlocked (if standing) and feet hip width apart.  · Without letting your nose or shoulders move, slowly tip your right / left ear to your shoulder until your feel a gentle stretch in the muscles on the opposite side of your neck.  · Hold __________ seconds.  Repeat __________ times. Complete this exercise __________ times per day.  STRETCH - Cervical Rotators   · Stand or sit on a firm surface. Assume a good posture: chest up, shoulders drawn back, abdominal muscles slightly tense, knees unlocked (if standing) and feet hip width apart.  · Keeping your eyes level with the ground, slowly turn your head until you feel a gentle stretch along   the back and opposite side of your neck.  · Hold __________ seconds.  Repeat __________ times. Complete this exercise __________ times per day.  RANGE OF MOTION - Neck Circles   · Stand or sit on a firm surface. Assume a good posture: chest up, shoulders drawn back, abdominal muscles slightly tense, knees unlocked (if standing) and feet hip width apart.  · Gently roll your head down and around from the back of one shoulder to the back of the other. The motion should never be forced or painful.  · Repeat the motion 10-20 times, or until you feel the neck muscles relax and loosen.  Repeat __________ times. Complete the exercise __________ times per day.  STRENGTHENING EXERCISES - Cervical Strain and Sprain  These exercises may help you when beginning to rehabilitate your injury. They may resolve your symptoms with or  without further involvement from your physician, physical therapist, or athletic trainer. While completing these exercises, remember:   · Muscles can gain both the endurance and the strength needed for everyday activities through controlled exercises.  · Complete these exercises as instructed by your physician, physical therapist, or athletic trainer. Progress the resistance and repetitions only as guided.  · You may experience muscle soreness or fatigue, but the pain or discomfort you are trying to eliminate should never worsen during these exercises. If this pain does worsen, stop and make certain you are following the directions exactly. If the pain is still present after adjustments, discontinue the exercise until you can discuss the trouble with your clinician.  STRENGTH - Cervical Flexors, Isometric  · Face a wall, standing about 6 inches away. Place a small pillow, a ball about 6-8 inches in diameter, or a folded towel between your forehead and the wall.  · Slightly tuck your chin and gently push your forehead into the soft object. Push only with mild to moderate intensity, building up tension gradually. Keep your jaw and forehead relaxed.  · Hold 10 to 20 seconds. Keep your breathing relaxed.  · Release the tension slowly. Relax your neck muscles completely before you start the next repetition.  Repeat __________ times. Complete this exercise __________ times per day.  STRENGTH- Cervical Lateral Flexors, Isometric   · Stand about 6 inches away from a wall. Place a small pillow, a ball about 6-8 inches in diameter, or a folded towel between the side of your head and the wall.  · Slightly tuck your chin and gently tilt your head into the soft object. Push only with mild to moderate intensity, building up tension gradually. Keep your jaw and forehead relaxed.  · Hold 10 to 20 seconds. Keep your breathing relaxed.  · Release the tension slowly. Relax your neck muscles completely before you start the next  repetition.  Repeat __________ times. Complete this exercise __________ times per day.  STRENGTH - Cervical Extensors, Isometric   · Stand about 6 inches away from a wall. Place a small pillow, a ball about 6-8 inches in diameter, or a folded towel between the back of your head and the wall.  · Slightly tuck your chin and gently tilt your head back into the soft object. Push only with mild to moderate intensity, building up tension gradually. Keep your jaw and forehead relaxed.  · Hold 10 to 20 seconds. Keep your breathing relaxed.  · Release the tension slowly. Relax your neck muscles completely before you start the next repetition.  Repeat __________ times. Complete this exercise __________ times per day.    All of your joints have less wear and tear when properly supported by a spine with good posture. This means you will experience a healthier, less painful body.  Correct posture must be practiced with all of your activities, especially prolonged sitting and standing. Correct posture is as important when doing repetitive low-stress activities (typing) as it is when doing a single heavy-load activity (lifting). PROLONGED STANDING WHILE SLIGHTLY LEANING FORWARD When completing a task that requires you to lean forward while standing in one  place for a long time, place either foot up on a stationary 2- to 4-inch high object to help maintain the best posture. When both feet are on the ground, the low back tends to lose its slight inward curve. If this curve flattens (or becomes too large), then the back and your other joints will experience too much stress, fatigue more quickly, and can cause pain.  RESTING POSITIONS Consider which positions are most painful for you when choosing a resting position. If you have pain with flexion-based activities (sitting, bending, stooping, squatting), choose a position that allows you to rest in a less flexed posture. You would want to avoid curling into a fetal position on your side. If your pain worsens with extension-based activities (prolonged standing, working overhead), avoid resting in an extended position such as sleeping on your stomach. Most people will find more comfort when they rest with their spine in a more neutral position, neither too rounded nor too arched. Lying on a non-sagging bed on your side with a pillow between your knees, or on your back with a pillow under your knees will often provide some relief. Keep in mind, being in any one position for a prolonged period of time, no matter how correct your posture, can still lead to stiffness. WALKING Walk with an upright posture. Your ears, shoulders, and hips should all line up. OFFICE WORK When working at a desk, create an environment that supports good, upright posture. Without extra support, muscles fatigue and lead to excessive strain on joints and other tissues. CHAIR:  A chair should be able to slide under your desk when your back makes contact with the back of the chair. This allows you to work closely.  The chair's height should allow your eyes to be level with the upper part of your monitor and your hands to be slightly lower than your elbows.  Body position:  Your feet should make contact with the floor. If this is not  possible, use a foot rest.  Keep your ears over your shoulders. This will reduce stress on your neck and low back.   This information is not intended to replace advice given to you by your health care provider. Make sure you discuss any questions you have with your health care provider.   Document Released: 02/21/2005 Document Revised: 03/14/2014 Document Reviewed: 06/05/2008 Elsevier Interactive Patient Education 2016 [employer]   Take pain medicine as needed. Begin exercises when pain improves. Return to emergency room for any concern.
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
## 2015-02-16 ENCOUNTER — Encounter: Payer: Self-pay | Admitting: Physician Assistant

## 2015-02-16 ENCOUNTER — Ambulatory Visit: Payer: Self-pay | Admitting: Physician Assistant

## 2015-02-16 VITALS — BP 120/80 | HR 80 | Temp 98.9°F

## 2015-02-16 DIAGNOSIS — H00016 Hordeolum externum left eye, unspecified eyelid: Secondary | ICD-10-CM

## 2015-02-16 MED ORDER — ERYTHROMYCIN 5 MG/GM OP OINT: 1.0000 "application " | TOPICAL_OINTMENT | Freq: Three times a day (TID) | OPHTHALMIC | Status: DC

## 2015-02-16 MED ORDER — FLUCONAZOLE 150 MG PO TABS: ORAL_TABLET | ORAL | Status: DC

## 2015-02-16 MED ORDER — AMOXICILLIN-POT CLAVULANATE 875-125 MG PO TABS: 1.0000 | ORAL_TABLET | Freq: Two times a day (BID) | ORAL | Status: DC

## 2015-02-16 NOTE — Progress Notes (Signed)
S: C/o freq sty to left lower lip, upper lid has cleared up some, now lower lid has 2 and area is really swollen, no drainage, no crusting, using warm compress and otc sty ease without relief  O: vitals wnl, nad, perrl eomi, conjunctiva wnl, lower lid on left swollen and red, + 2 styes in lash line, neck supple no lymph, lungs c t a, cv rrr  A: sty with concerns of duct infection  P: augmentin , diflucan, e-mycin ointment, if not better in a few days then will refer to opth

## 2015-03-11 DIAGNOSIS — Z30013 Encounter for initial prescription of injectable contraceptive: Secondary | ICD-10-CM | POA: Diagnosis not present

## 2015-03-11 DIAGNOSIS — Z3200 Encounter for pregnancy test, result unknown: Secondary | ICD-10-CM | POA: Diagnosis not present

## 2015-03-11 DIAGNOSIS — O901 Disruption of perineal obstetric wound: Secondary | ICD-10-CM | POA: Diagnosis not present

## 2015-04-21 ENCOUNTER — Ambulatory Visit: Payer: Self-pay | Admitting: Physician Assistant

## 2015-04-21 ENCOUNTER — Encounter: Payer: Self-pay | Admitting: Physician Assistant

## 2015-04-21 VITALS — BP 120/70 | HR 84 | Temp 98.5°F

## 2015-04-21 DIAGNOSIS — L309 Dermatitis, unspecified: Secondary | ICD-10-CM

## 2015-04-21 MED ORDER — TRIAMCINOLONE ACETONIDE 0.1 % EX CREA: 1.0000 "application " | TOPICAL_CREAM | Freq: Two times a day (BID) | CUTANEOUS | Status: DC

## 2015-04-21 NOTE — Progress Notes (Signed)
S: c/o rash on forearms, is itchy, no new soaps or detergents, no fever/chills  O: vitals wnl, nad, skin on forearms dry with small red bumps, no burrows, no rash in folds of skin, no drainage from sites, n/v intact  A: eczema  P: triamcinolone cream

## 2015-06-02 DIAGNOSIS — Z3042 Encounter for surveillance of injectable contraceptive: Secondary | ICD-10-CM | POA: Diagnosis not present

## 2015-07-03 ENCOUNTER — Ambulatory Visit: Payer: Self-pay | Admitting: Physician Assistant

## 2015-07-03 ENCOUNTER — Encounter: Payer: Self-pay | Admitting: Physician Assistant

## 2015-07-03 VITALS — BP 120/80 | HR 84 | Temp 99.1°F

## 2015-07-03 DIAGNOSIS — R112 Nausea with vomiting, unspecified: Secondary | ICD-10-CM

## 2015-07-03 DIAGNOSIS — N390 Urinary tract infection, site not specified: Secondary | ICD-10-CM

## 2015-07-03 LAB — POCT URINALYSIS DIPSTICK
Bilirubin, UA: NEGATIVE
GLUCOSE UA: NEGATIVE
Ketones, UA: NEGATIVE
NITRITE UA: NEGATIVE
PH UA: 6
Protein, UA: NEGATIVE
Spec Grav, UA: 1.025
Urobilinogen, UA: 0.2

## 2015-07-03 LAB — POCT URINE PREGNANCY: Preg Test, Ur: NEGATIVE

## 2015-07-03 MED ORDER — CIPROFLOXACIN HCL 250 MG PO TABS
250.0000 mg | ORAL_TABLET | Freq: Two times a day (BID) | ORAL | Status: DC
Start: 2015-07-03 — End: 2015-07-26

## 2015-07-03 MED ORDER — ONDANSETRON HCL 4 MG PO TABS: 4.0000 mg | ORAL_TABLET | Freq: Three times a day (TID) | ORAL | Status: DC | PRN

## 2015-07-03 NOTE — Progress Notes (Signed)
S: c/o  Nausea for 1 month, vomited x 1 last, couple episodes of watery stools, daughter has same sx for about 2 days, took home preg test and it was neg, no sexual intercourse for 3 months, is on depo, last injection was in march has not been late or skipped injection, states does have some abdominal pain in r side but can't associate it with anything, nausea is worse at night, no uti sx, denies vag discharge, had normal vag delivery 6 months ago  O: vitals w low grade temp 99; lungs c t a, cv rrr, abd soft a little tender on r side about mid abdomen, bs normal all 4 quads, ua trace leuks, hcg neg  A: fever, uti, abdominal pain  P: treating for uti even though just trace of leuks due to temp, pt is to f/u with her regular doctor for eval and labs if not improving over the weekend, if worsening go to ER

## 2015-07-10 ENCOUNTER — Emergency Department: Payer: 59

## 2015-07-10 ENCOUNTER — Emergency Department
Admission: EM | Admit: 2015-07-10 | Discharge: 2015-07-11 | Disposition: A | Discharge status: Home / Self Care | Payer: 59 | Attending: Emergency Medicine | Admitting: Emergency Medicine

## 2015-07-10 DIAGNOSIS — R112 Nausea with vomiting, unspecified: Secondary | ICD-10-CM | POA: Diagnosis not present

## 2015-07-10 DIAGNOSIS — Z87891 Personal history of nicotine dependence: Secondary | ICD-10-CM | POA: Insufficient documentation

## 2015-07-10 DIAGNOSIS — R1032 Left lower quadrant pain: Secondary | ICD-10-CM | POA: Diagnosis not present

## 2015-07-10 DIAGNOSIS — R197 Diarrhea, unspecified: Secondary | ICD-10-CM | POA: Diagnosis not present

## 2015-07-10 DIAGNOSIS — R1031 Right lower quadrant pain: Secondary | ICD-10-CM | POA: Diagnosis not present

## 2015-07-10 LAB — COMPREHENSIVE METABOLIC PANEL
ALBUMIN: 4.3 g/dL (ref 3.5–5.0)
ALK PHOS: 56 U/L (ref 38–126)
ALT: 22 U/L (ref 14–54)
ANION GAP: 7 (ref 5–15)
AST: 21 U/L (ref 15–41)
BUN: 13 mg/dL (ref 6–20)
CALCIUM: 9.5 mg/dL (ref 8.9–10.3)
CO2: 21 mmol/L — ABNORMAL LOW (ref 22–32)
CREATININE: 0.57 mg/dL (ref 0.44–1.00)
Chloride: 109 mmol/L (ref 101–111)
GFR calc Af Amer: >60 mL/min (ref >60)
GFR calc non Af Amer: >60 mL/min (ref >60)
GLUCOSE: 104 mg/dL — AB (ref 65–99)
Potassium: 3.7 mmol/L (ref 3.5–5.1)
Sodium: 137 mmol/L (ref 135–145)
TOTAL PROTEIN: 7.7 g/dL (ref 6.5–8.1)
Total Bilirubin: 0.5 mg/dL (ref 0.3–1.2)

## 2015-07-10 LAB — URINALYSIS COMPLETE WITH MICROSCOPIC (ARMC ONLY)
BILIRUBIN URINE: NEGATIVE
Bacteria, UA: NONE SEEN
GLUCOSE, UA: NEGATIVE mg/dL
Hgb urine dipstick: NEGATIVE
KETONES UR: NEGATIVE mg/dL
NITRITE: NEGATIVE
Protein, ur: NEGATIVE mg/dL
SPECIFIC GRAVITY, URINE: 1.014 (ref 1.005–1.030)
pH: 6 (ref 5.0–8.0)

## 2015-07-10 LAB — CBC
HCT: 40.2 % (ref 35.0–47.0)
HEMOGLOBIN: 13.3 g/dL (ref 12.0–16.0)
MCH: 28.5 pg (ref 26.0–34.0)
MCHC: 33.2 g/dL (ref 32.0–36.0)
MCV: 85.8 fL (ref 80.0–100.0)
PLATELETS: 238 10*3/uL (ref 150–440)
RBC: 4.69 MIL/uL (ref 3.80–5.20)
RDW: 13.6 % (ref 11.5–14.5)
WBC: 13.8 10*3/uL — ABNORMAL HIGH (ref 3.6–11.0)

## 2015-07-10 LAB — POCT PREGNANCY, URINE: Preg Test, Ur: NEGATIVE

## 2015-07-10 MED ORDER — KETOROLAC TROMETHAMINE 30 MG/ML IJ SOLN
30.0000 mg | Freq: Once | INTRAMUSCULAR | Status: AC
  Administered 2015-07-11: 30 mg via INTRAVENOUS
  Filled 2015-07-10: qty 1

## 2015-07-10 MED ORDER — SODIUM CHLORIDE 0.9 % IV BOLUS (SEPSIS)
500.0000 mL | Freq: Once | INTRAVENOUS | Status: AC
  Administered 2015-07-11: 500 mL via INTRAVENOUS

## 2015-07-10 MED ORDER — ONDANSETRON HCL 4 MG/2ML IJ SOLN
4.0000 mg | Freq: Once | INTRAMUSCULAR | Status: AC
  Administered 2015-07-11: 4 mg via INTRAVENOUS
  Filled 2015-07-10: qty 2

## 2015-07-10 NOTE — ED Notes (Signed)
One month of nausea at night. Has seen doctors and prescribed nausea medications which help but RLQ abdominal pain persists. Saw PA Sherrilee GillesMeredith Sigman and sent here for possible early appendicitis. WBC was elevated and urine was dirty per patient.

## 2015-07-10 NOTE — ED Notes (Signed)
Pt reports that her MD sent her to the er - she has been having nausea and vomiting for the last month  - She has been treated with Cipro for elevated WBC unspecified per the pt - pt started the Cipro 7 days ago - the pt reports that the MD told her she had an extremely elevated WBC tonight and that her PCP thought it might be her appendix and to come to the er

## 2015-07-11 DIAGNOSIS — R1031 Right lower quadrant pain: Secondary | ICD-10-CM | POA: Diagnosis not present

## 2015-07-11 DIAGNOSIS — Z87891 Personal history of nicotine dependence: Secondary | ICD-10-CM | POA: Diagnosis not present

## 2015-07-11 MED ORDER — PANTOPRAZOLE SODIUM 20 MG PO TBEC: 20.0000 mg | DELAYED_RELEASE_TABLET | Freq: Every day | ORAL | Status: DC

## 2015-07-11 NOTE — ED Provider Notes (Signed)
Time Seen: Approximately *2320 I have reviewed the triage notes  Chief Complaint: Abdominal Pain   History of Present Illness: Sonya [name] is a 25 y.o. female who presents with feelings of nausea and occasional vomiting over the last month. She is approximately 6 months postpartum. She went to see her OB/GYN and was told that she had a urinary tract infection was started on ciprofloxacin. She states the nausea still has continued and she had an elevated white blood cell count was referred here to emergency department for evaluation of possible appendicitis. She states her pain or right lower quadrant seems to come and go. She has some mild normal vaginal discharge. She denies any melena, hematochezia, diarrhea, constipation. Upper abdominal pain and states that she's had occasional pain in the right lower quadrant. She denies any radiation to the flank or back area. She also points somewhat to the left lower quadrant toward the middle of the abdomen and also sources of discomfort. He is not aware of any high fever at home. She is currently bottlefeeding History reviewed. No pertinent past medical history.  Patient Active Problem List   Diagnosis Date Noted  . Indication for care in labor and delivery, delivered 12/26/2014  . Indication for care or intervention related to labor and delivery 12/23/2014  . Labor and delivery, indication for care 10/13/2014    History reviewed. No pertinent past surgical history.  History reviewed. No pertinent past surgical history.  Current Outpatient Rx  Name  Route  Sig  Dispense  Refill  . ciprofloxacin (CIPRO) 250 MG tablet   Oral   Take 1 tablet (250 mg total) by mouth 2 (two) times daily.   14 tablet   0   . meclizine (ANTIVERT) 25 MG tablet   Oral   Take 25 mg by mouth 2 (two) times daily as needed for dizziness.         . medroxyPROGESTERone (DEPO-PROVERA) 150 MG/ML injection   Intramuscular   Inject 150 mg into the muscle every 3  (three) months. Reported on 07/03/2015         . ondansetron (ZOFRAN) 4 MG tablet   Oral   Take 1 tablet (4 mg total) by mouth every 8 (eight) hours as needed for nausea or vomiting.   20 tablet   0   . triamcinolone cream (KENALOG) 0.1 %   Topical   Apply 1 application topically 2 (two) times daily. Patient taking differently: Apply 1 application topically 2 (two) times daily as needed (for eczema).    30 g   3   . pantoprazole (PROTONIX) 20 MG tablet   Oral   Take 1 tablet (20 mg total) by mouth daily.   30 tablet   1     Allergies:  Codeine  Family History: No family history on file.  Social History: Social History  Substance Use Topics  . Smoking status: Former Games developer  . Smokeless tobacco: Never Used  . Alcohol Use: No     Review of Systems:   10 point review of systems was performed and was otherwise negative:  Constitutional: No fever Eyes: No visual disturbances ENT: No sore throat, ear pain Cardiac: No chest pain Respiratory: No shortness of breath, wheezing, or stridor Abdomen: Pain is intermittent right lower quadrant usually into the left lower quadrant Endocrine: No weight loss, No night sweats Extremities: No peripheral edema, cyanosis Skin: No rashes, easy bruising Neurologic: No focal weakness, trouble with speech or swollowing Urologic: No dysuria, Hematuria,  or urinary frequency   Physical Exam:  ED Triage Vitals  Enc Vitals Group     BP 07/10/15 2214 128/73 mmHg     Pulse Rate 07/10/15 2214 100     Resp 07/10/15 2214 18     Temp 07/10/15 2212 98.3 F (36.8 C)     Temp Source 07/10/15 2212 Oral     SpO2 07/10/15 2214 97 %     Weight 07/10/15 2214 183 lb (83.008 kg)     Height 07/10/15 2214 5\' 3"  (1.6 m)     Head Cir --      Peak Flow --      Pain Score 07/10/15 2218 0     Pain Loc --      Pain Edu? --      Excl. in GC? --     General: Awake , Alert , and Oriented times 3; GCS 15 Head: Normal cephalic , atraumatic Eyes:  Pupils equal , round, reactive to light Nose/Throat: No nasal drainage, patent upper airway without erythema or exudate.  Neck: Supple, Full range of motion, No anterior adenopathy or palpable thyroid masses Lungs: Clear to ascultation without wheezes , rhonchi, or rales Heart: Regular rate, regular rhythm without murmurs , gallops , or rubs Abdomen: Mild tenderness to deep palpation across the right lower quadrant medial and inferior to McBurney's point and also across the left lower quadrant. Bowel sounds are positive and symmetric in all 4 quadrants. Negative Murphy's sign.     Extremities: 2 plus symmetric pulses. No edema, clubbing or cyanosis Neurologic: normal ambulation, Motor symmetric without deficits, sensory intact Skin: warm, dry, no rashes   Labs:   All laboratory work was reviewed including any pertinent negatives or positives listed below:  Labs Reviewed  COMPREHENSIVE METABOLIC PANEL - Abnormal; Notable for the following:    CO2 21 (*)    Glucose, Bld 104 (*)    All other components within normal limits  CBC - Abnormal; Notable for the following:    WBC 13.8 (*)    All other components within normal limits  URINALYSIS COMPLETEWITH MICROSCOPIC ([facility] ONLY) - Abnormal; Notable for the following:    Color, Urine YELLOW (*)    APPearance CLEAR (*)    Leukocytes, UA TRACE (*)    Squamous Epithelial / LPF 0-5 (*)    All other components within normal limits  URINE CULTURE  POC URINE PREG, ED  POCT PREGNANCY, URINE  Outside a white blood cell count being elevated all laboratory work was within normal limits   Radiology:    CLINICAL DATA: Right lower quadrant pain.  EXAM: DG ABDOMEN ACUTE W/ 1V CHEST  COMPARISON: None.  FINDINGS: There is no evidence of dilated bowel loops or free intraperitoneal air. No radiopaque calculi or other significant radiographic abnormality is seen. Heart size and mediastinal contours are within normal limits. Both lungs are  clear.  IMPRESSION: Negative abdominal radiographs. No acute cardiopulmonary disease.        I personally reviewed the radiologic studies     ED Course:  Patient's stay was uneventful Differential diagnosis includes but is not exclusive to ovarian cyst, ovarian torsion, acute appendicitis, urinary tract infection, endometriosis, bowel obstruction, colitis, renal colic, gastroenteritis, etc. Given the patient's current clinical presentation was not sure of the exact nature of her abdominal pain and persistent nausea at this point. The patient does not appear to have any surgical findings at this juncture. This could be viral in nature given the persistent  nausea or upper gastritis etc. I advised her take Tylenol over-the-counter avoid Naprosyn and nonsteroidal medications. He was referred to general medicine with possible upcoming GI referral. I placed her on Protonix and she has Zofran at home. I've also cultured the urine since she is only been on Cipro and advised her to complete the ciprofloxacin.   Assessment: * Acute unspecified lower abdominal pain female  Final Clinical Impression  Final diagnoses:  Right lower quadrant abdominal pain     Plan:  Outpatient management Patient was advised to return immediately if condition worsens. Patient was advised to follow up with their primary care physician or other specialized physicians involved in their outpatient care. The patient and/or family member/power of attorney had laboratory results reviewed at the bedside. All questions and concerns were addressed and appropriate discharge instructions were distributed by the nursing staff.             Jennye Moccasin, MD 07/11/15 [phone]

## 2015-07-11 NOTE — Discharge Instructions (Signed)
Abdominal Pain, Adult °Many things can cause abdominal pain. Usually, abdominal pain is not caused by a disease and will improve without treatment. It can often be observed and treated at home. Your health care provider will do a physical exam and possibly order blood tests and X-rays to help determine the seriousness of your pain. However, in many cases, more time must pass before a clear cause of the pain can be found. Before that point, your health care provider may not know if you need more testing or further treatment. °HOME CARE INSTRUCTIONS °Monitor your abdominal pain for any changes. The following actions may help to alleviate any discomfort you are experiencing: °· Only take over-the-counter or prescription medicines as directed by your health care provider. °· Do not take laxatives unless directed to do so by your health care provider. °· Try a clear liquid diet (broth, tea, or water) as directed by your health care provider. Slowly move to a bland diet as tolerated. °SEEK MEDICAL CARE IF: °· You have unexplained abdominal pain. °· You have abdominal pain associated with nausea or diarrhea. °· You have pain when you urinate or have a bowel movement. °· You experience abdominal pain that wakes you in the night. °· You have abdominal pain that is worsened or improved by eating food. °· You have abdominal pain that is worsened with eating fatty foods. °· You have a fever. °SEEK IMMEDIATE MEDICAL CARE IF: °· Your pain does not go away within 2 hours. °· You keep throwing up (vomiting). °· Your pain is felt only in portions of the abdomen, such as the right side or the left lower portion of the abdomen. °· You pass bloody or black tarry stools. °MAKE SURE YOU: °· Understand these instructions. °· Will watch your condition. °· Will get help right away if you are not doing well or get worse. °  °This information is not intended to replace advice given to you by your health care provider. Make sure you discuss  any questions you have with your health care provider. °  °Document Released: 12/01/2004 Document Revised: 11/12/2014 Document Reviewed: 10/31/2012 °Elsevier Interactive Patient Education ©2016 Elsevier Inc. ° ° °Please return immediately if condition worsens. Please contact her primary physician or the physician you were given for referral. If you have any specialist physicians involved in her treatment and plan please also contact them. Thank you for using [facility]. ° °

## 2015-07-13 LAB — URINE CULTURE: Culture: 6000 — AB

## 2015-07-26 ENCOUNTER — Emergency Department
Admission: EM | Admit: 2015-07-26 | Discharge: 2015-07-26 | Disposition: A | Discharge status: Home / Self Care | Payer: 59 | Attending: Emergency Medicine | Admitting: Emergency Medicine

## 2015-07-26 ENCOUNTER — Encounter: Payer: Self-pay | Admitting: Emergency Medicine

## 2015-07-26 DIAGNOSIS — Z79899 Other long term (current) drug therapy: Secondary | ICD-10-CM | POA: Insufficient documentation

## 2015-07-26 DIAGNOSIS — Z87891 Personal history of nicotine dependence: Secondary | ICD-10-CM | POA: Insufficient documentation

## 2015-07-26 DIAGNOSIS — R42 Dizziness and giddiness: Secondary | ICD-10-CM | POA: Insufficient documentation

## 2015-07-26 HISTORY — DX: Dizziness and giddiness: R42

## 2015-07-26 LAB — CBC
HCT: 39.5 % (ref 35.0–47.0)
Hemoglobin: 13.4 g/dL (ref 12.0–16.0)
MCH: 28.8 pg (ref 26.0–34.0)
MCHC: 34.1 g/dL (ref 32.0–36.0)
MCV: 84.5 fL (ref 80.0–100.0)
Platelets: 244 10*3/uL (ref 150–440)
RBC: 4.67 MIL/uL (ref 3.80–5.20)
RDW: 13.3 % (ref 11.5–14.5)
WBC: 10.5 10*3/uL (ref 3.6–11.0)

## 2015-07-26 LAB — URINALYSIS COMPLETE WITH MICROSCOPIC (ARMC ONLY)
Bilirubin Urine: NEGATIVE
GLUCOSE, UA: NEGATIVE mg/dL
KETONES UR: NEGATIVE mg/dL
NITRITE: NEGATIVE
Protein, ur: NEGATIVE mg/dL
Specific Gravity, Urine: 1.019 (ref 1.005–1.030)
pH: 5 (ref 5.0–8.0)

## 2015-07-26 LAB — BASIC METABOLIC PANEL
ANION GAP: 8 (ref 5–15)
BUN: 12 mg/dL (ref 6–20)
CO2: 20 mmol/L — ABNORMAL LOW (ref 22–32)
CREATININE: 0.63 mg/dL (ref 0.44–1.00)
Calcium: 9.3 mg/dL (ref 8.9–10.3)
Chloride: 109 mmol/L (ref 101–111)
GFR calc Af Amer: >60 mL/min (ref >60)
GLUCOSE: 115 mg/dL — AB (ref 65–99)
Potassium: 3.3 mmol/L — ABNORMAL LOW (ref 3.5–5.1)
Sodium: 137 mmol/L (ref 135–145)

## 2015-07-26 LAB — POCT PREGNANCY, URINE: PREG TEST UR: NEGATIVE

## 2015-07-26 MED ORDER — SODIUM CHLORIDE 0.9 % IV BOLUS (SEPSIS)
1000.0000 mL | Freq: Once | INTRAVENOUS | Status: AC
  Administered 2015-07-26: 1000 mL via INTRAVENOUS

## 2015-07-26 MED ORDER — CIPROFLOXACIN HCL 500 MG PO TABS: 500.0000 mg | ORAL_TABLET | Freq: Two times a day (BID) | ORAL | Status: DC

## 2015-07-26 MED ORDER — DIAZEPAM 5 MG PO TABS: 5.0000 mg | ORAL_TABLET | Freq: Three times a day (TID) | ORAL | Status: AC | PRN

## 2015-07-26 MED ORDER — ONDANSETRON HCL 4 MG/2ML IJ SOLN
INTRAMUSCULAR | Status: AC
  Filled 2015-07-26: qty 2

## 2015-07-26 MED ORDER — DIAZEPAM 5 MG/ML IJ SOLN
5.0000 mg | Freq: Once | INTRAMUSCULAR | Status: AC
  Administered 2015-07-26: 5 mg via INTRAVENOUS
  Filled 2015-07-26: qty 2

## 2015-07-26 MED ORDER — ONDANSETRON HCL 4 MG/2ML IJ SOLN
4.0000 mg | Freq: Once | INTRAMUSCULAR | Status: AC
  Administered 2015-07-26: 4 mg via INTRAVENOUS

## 2015-07-26 NOTE — ED Notes (Signed)
Pt states she is feeling better and the valium helped her. Requesting to be D/C with valium prescription. EDP made aware

## 2015-07-26 NOTE — ED Notes (Signed)
C/O vertigo.  Woke up this morning with symptoms.  Patient has history of vertigo and states that this is the 5th or 6th episode over the past 2 years.  Tried to take meclizine and zofran this morning, but unable to keep meds down.

## 2015-07-26 NOTE — ED Provider Notes (Signed)
[facility] Emergency Department Provider Note  ____________________________________________    I have reviewed the triage vital signs and the nursing notes.   HISTORY  Chief Complaint Dizziness    HPI Sonya [name] is a 25 y.o. female who presents with complaints of vertigo. Patient reports a history of vertigo, she notes 5 episodes over the last 2 years, typically she is able take meclizine and Zofran and feels better with time but today she was so nauseous she was unable to tolerate her medications. She denies headache or neuro deficits. No head injuries. No fevers or chills. She states a sensation of the room moving with any movement of her head which is typical of her symptoms     Past Medical History  Diagnosis Date  . Vertigo     Patient Active Problem List   Diagnosis Date Noted  . Indication for care in labor and delivery, delivered 12/26/2014  . Indication for care or intervention related to labor and delivery 12/23/2014  . Labor and delivery, indication for care 10/13/2014    No past surgical history on file.  Current Outpatient Rx  Name  Route  Sig  Dispense  Refill  . ciprofloxacin (CIPRO) 250 MG tablet   Oral   Take 1 tablet (250 mg total) by mouth 2 (two) times daily.   14 tablet   0   . meclizine (ANTIVERT) 25 MG tablet   Oral   Take 25 mg by mouth 2 (two) times daily as needed for dizziness.         . medroxyPROGESTERone (DEPO-PROVERA) 150 MG/ML injection   Intramuscular   Inject 150 mg into the muscle every 3 (three) months. Reported on 07/03/2015         . ondansetron (ZOFRAN) 4 MG tablet   Oral   Take 1 tablet (4 mg total) by mouth every 8 (eight) hours as needed for nausea or vomiting.   20 tablet   0   . pantoprazole (PROTONIX) 20 MG tablet   Oral   Take 1 tablet (20 mg total) by mouth daily.   30 tablet   1   . triamcinolone cream (KENALOG) 0.1 %   Topical   Apply 1 application topically 2 (two) times  daily. Patient taking differently: Apply 1 application topically 2 (two) times daily as needed (for eczema).    30 g   3     Allergies Codeine  No family history on file.  Social History Social History  Substance Use Topics  . Smoking status: Former Games developer  . Smokeless tobacco: Never Used  . Alcohol Use: No    Review of Systems  Constitutional: Negative for fever. Eyes: Negative for redness ENT: Negative for sore throat, No ear pain Cardiovascular: Negative for chest pain Respiratory: Negative for shortness of breath.No cough Gastrointestinal: Positive for nausea Genitourinary: Negative for dysuria. Musculoskeletal: Negative for back pain. Skin: Negative for rash. Neurological: Negative for focal weakness Psychiatric: no anxiety    ____________________________________________   PHYSICAL EXAM:  VITAL SIGNS: ED Triage Vitals  Enc Vitals Group     BP 07/26/15 0915 123/77 mmHg     Pulse Rate 07/26/15 0915 93     Resp 07/26/15 0915 16     Temp 07/26/15 0915 97.7 F (36.5 C)     Temp Source 07/26/15 0915 Oral     SpO2 07/26/15 0915 97 %     Weight 07/26/15 0915 180 lb (81.647 kg)     Height 07/26/15 0915  5\' 3"  (1.6 m)     Head Cir --      Peak Flow --      Pain Score 07/26/15 0916 2     Pain Loc --      Pain Edu? --      Excl. in GC? --      Constitutional: Alert and oriented. Well appearing and in no distress.  Eyes: Conjunctivae are normal. No erythema or injection ENT   Head: Normocephalic and atraumatic.   Mouth/Throat: Mucous membranes are moist. Cardiovascular: Normal rate, regular rhythm. Normal and symmetric distal pulses are present in the upper extremities.  Respiratory: Normal respiratory effort without tachypnea nor retractions. Breath sounds are clear and equal bilaterally.  Gastrointestinal: Soft and non-tender in all quadrants. No distention. There is no CVA tenderness. Genitourinary: deferred Musculoskeletal: Nontender with normal  range of motion in all extremities. No lower extremity tenderness nor edema. Neurologic:  Normal speech and language. No gross focal neurologic deficits are appreciated. Skin:  Skin is warm, dry and intact. No rash noted. Psychiatric: Mood and affect are normal. Patient exhibits appropriate insight and judgment.  ____________________________________________    LABS (pertinent positives/negatives)  Labs Reviewed  BASIC METABOLIC PANEL - Abnormal; Notable for the following:    Potassium 3.3 (*)    CO2 20 (*)    Glucose, Bld 115 (*)    All other components within normal limits  URINALYSIS COMPLETEWITH MICROSCOPIC ([facility] ONLY) - Abnormal; Notable for the following:    Color, Urine YELLOW (*)    APPearance HAZY (*)    Hgb urine dipstick 1+ (*)    Leukocytes, UA 2+ (*)    Bacteria, UA RARE (*)    Squamous Epithelial / LPF 0-5 (*)    All other components within normal limits  CBC  POCT PREGNANCY, URINE  CBG MONITORING, ED    ____________________________________________   EKG  ED ECG REPORT I, Jene EveryKINNER, Chalisa Kobler, the attending physician, personally viewed and interpreted this ECG.  Date: 07/26/2015 EKG Time: 9:22 AM Rate: 93 Rhythm: normal sinus rhythm QRS Axis: normal Intervals: normal ST/T Wave abnormalities: normal Conduction Disturbances: none Narrative Interpretation: unremarkable   ____________________________________________    RADIOLOGY  None  ____________________________________________   PROCEDURES  Procedure(s) performed: none  Critical Care performed: none  ____________________________________________   INITIAL IMPRESSION / ASSESSMENT AND PLAN / ED COURSE  Pertinent labs & imaging results that were available during my care of the patient were reviewed by me and considered in my medical decision making (see chart for details).  Patient presents with symptoms of vertigo. She's had this many times before and apparently has been worked up for it in  the past, she reports her symptoms are consistent with prior episodes. We will treat her with IV Valium and Zofran and fluids and reevaluate  ----------------------------------------- 12:03 PM on 07/26/2015 -----------------------------------------  Patient reports she feels significantly better and is asking to leave she requests a prescription for Valium because that seemed to help her symptoms significantly. Feel this is reasonable to use when necessary, will write for antibiotics as well given possible early UTI  ____________________________________________   FINAL CLINICAL IMPRESSION(S) / ED DIAGNOSES  Final diagnoses:  Vertigo          Jene Everyobert Daviyon Widmayer, MD 07/26/15 1529

## 2015-07-26 NOTE — Discharge Instructions (Signed)
Benign Positional Vertigo Vertigo is the feeling that you or your surroundings are moving when they are not. Benign positional vertigo is the most common form of vertigo. The cause of this condition is not serious (is benign). This condition is triggered by certain movements and positions (is positional). This condition can be dangerous if it occurs while you are doing something that could endanger you or others, such as driving.  CAUSES In many cases, the cause of this condition is not known. It may be caused by a disturbance in an area of the inner ear that helps your brain to sense movement and balance. This disturbance can be caused by a viral infection (labyrinthitis), head injury, or repetitive motion. RISK FACTORS This condition is more likely to develop in:  Women.  People who are 50 years of age or older. SYMPTOMS Symptoms of this condition usually happen when you move your head or your eyes in different directions. Symptoms may start suddenly, and they usually last for less than a minute. Symptoms may include:  Loss of balance and falling.  Feeling like you are spinning or moving.  Feeling like your surroundings are spinning or moving.  Nausea and vomiting.  Blurred vision.  Dizziness.  Involuntary eye movement (nystagmus). Symptoms can be mild and cause only slight annoyance, or they can be severe and interfere with daily life. Episodes of benign positional vertigo may return (recur) over time, and they may be triggered by certain movements. Symptoms may improve over time. DIAGNOSIS This condition is usually diagnosed by medical history and a physical exam of the head, neck, and ears. You may be referred to a health care provider who specializes in ear, nose, and throat (ENT) problems (otolaryngologist) or a provider who specializes in disorders of the nervous system (neurologist). You may have additional testing, including:  MRI.  A CT scan.  Eye movement tests. Your  health care provider may ask you to change positions quickly while he or she watches you for symptoms of benign positional vertigo, such as nystagmus. Eye movement may be tested with an electronystagmogram (ENG), caloric stimulation, the Dix-Hallpike test, or the roll test.  An electroencephalogram (EEG). This records electrical activity in your brain.  Hearing tests. TREATMENT Usually, your health care provider will treat this by moving your head in specific positions to adjust your inner ear back to normal. Surgery may be needed in severe cases, but this is rare. In some cases, benign positional vertigo may resolve on its own in 2-4 weeks. HOME CARE INSTRUCTIONS Safety  Move slowly.Avoid sudden body or head movements.  Avoid driving.  Avoid operating heavy machinery.  Avoid doing any tasks that would be dangerous to you or others if a vertigo episode would occur.  If you have trouble walking or keeping your balance, try using a cane for stability. If you feel dizzy or unstable, sit down right away.  Return to your normal activities as told by your health care provider. Ask your health care provider what activities are safe for you. General Instructions  Take over-the-counter and prescription medicines only as told by your health care provider.  Avoid certain positions or movements as told by your health care provider.  Drink enough fluid to keep your urine clear or pale yellow.  Keep all follow-up visits as told by your health care provider. This is important. SEEK MEDICAL CARE IF:  You have a fever.  Your condition gets worse or you develop new symptoms.  Your family or friends   notice any behavioral changes.  Your nausea or vomiting gets worse.  You have numbness or a "pins and needles" sensation. SEEK IMMEDIATE MEDICAL CARE IF:  You have difficulty speaking or moving.  You are always dizzy.  You faint.  You develop severe headaches.  You have weakness in your  legs or arms.  You have changes in your hearing or vision.  You develop a stiff neck.  You develop sensitivity to light.   This information is not intended to replace advice given to you by your health care provider. Make sure you discuss any questions you have with your health care provider.   Document Released: 11/29/2005 Document Revised: 11/12/2014 Document Reviewed: 06/16/2014 Elsevier Interactive Patient Education 2016 Elsevier Inc.  

## 2015-08-07 DIAGNOSIS — R42 Dizziness and giddiness: Secondary | ICD-10-CM | POA: Diagnosis not present

## 2015-08-07 DIAGNOSIS — H6983 Other specified disorders of Eustachian tube, bilateral: Secondary | ICD-10-CM | POA: Diagnosis not present

## 2015-08-07 DIAGNOSIS — H9313 Tinnitus, bilateral: Secondary | ICD-10-CM | POA: Diagnosis not present

## 2015-08-07 DIAGNOSIS — H93293 Other abnormal auditory perceptions, bilateral: Secondary | ICD-10-CM | POA: Diagnosis not present

## 2015-08-18 DIAGNOSIS — R42 Dizziness and giddiness: Secondary | ICD-10-CM | POA: Diagnosis not present

## 2015-08-24 DIAGNOSIS — Z3042 Encounter for surveillance of injectable contraceptive: Secondary | ICD-10-CM | POA: Diagnosis not present

## 2015-08-27 DIAGNOSIS — H8101 Meniere's disease, right ear: Secondary | ICD-10-CM | POA: Diagnosis not present

## 2015-09-29 ENCOUNTER — Other Ambulatory Visit
Admission: RE | Admit: 2015-09-29 | Discharge: 2015-09-29 | Disposition: A | Discharge status: Home / Self Care | Payer: 59 | Source: Ambulatory Visit | Attending: Otolaryngology | Admitting: Otolaryngology

## 2015-09-29 DIAGNOSIS — R42 Dizziness and giddiness: Secondary | ICD-10-CM | POA: Diagnosis not present

## 2015-09-29 LAB — POTASSIUM: Potassium: 4 mmol/L (ref 3.5–5.1)

## 2015-11-02 DIAGNOSIS — J019 Acute sinusitis, unspecified: Secondary | ICD-10-CM | POA: Diagnosis not present

## 2016-01-20 ENCOUNTER — Encounter: Payer: Self-pay | Admitting: Obstetrics and Gynecology

## 2017-01-28 ENCOUNTER — Encounter: Payer: Self-pay | Admitting: Emergency Medicine

## 2017-01-28 ENCOUNTER — Other Ambulatory Visit: Payer: Self-pay

## 2017-01-28 ENCOUNTER — Emergency Department
Admission: EM | Admit: 2017-01-28 | Discharge: 2017-01-29 | Disposition: A | Discharge status: Other Acute Inpatient Hospital | Payer: BC Managed Care – PPO | Attending: Internal Medicine | Admitting: Internal Medicine

## 2017-01-28 DIAGNOSIS — R45851 Suicidal ideations: Secondary | ICD-10-CM | POA: Diagnosis not present

## 2017-01-28 DIAGNOSIS — F329 Major depressive disorder, single episode, unspecified: Secondary | ICD-10-CM | POA: Diagnosis not present

## 2017-01-28 DIAGNOSIS — Z87891 Personal history of nicotine dependence: Secondary | ICD-10-CM | POA: Diagnosis not present

## 2017-01-28 DIAGNOSIS — T50902A Poisoning by unspecified drugs, medicaments and biological substances, intentional self-harm, initial encounter: Secondary | ICD-10-CM

## 2017-01-28 LAB — COMPREHENSIVE METABOLIC PANEL
ALT: 23 U/L (ref 14–54)
ANION GAP: 10 (ref 5–15)
AST: 22 U/L (ref 15–41)
Albumin: 4.9 g/dL (ref 3.5–5.0)
Alkaline Phosphatase: 59 U/L (ref 38–126)
BUN: 10 mg/dL (ref 6–20)
CALCIUM: 9.7 mg/dL (ref 8.9–10.3)
CHLORIDE: 103 mmol/L (ref 101–111)
CO2: 23 mmol/L (ref 22–32)
CREATININE: 0.59 mg/dL (ref 0.44–1.00)
Glucose, Bld: 103 mg/dL — ABNORMAL HIGH (ref 65–99)
Potassium: 3.3 mmol/L — ABNORMAL LOW (ref 3.5–5.1)
Sodium: 136 mmol/L (ref 135–145)
Total Bilirubin: 0.6 mg/dL (ref 0.3–1.2)
Total Protein: 9 g/dL — ABNORMAL HIGH (ref 6.5–8.1)

## 2017-01-28 LAB — URINALYSIS, COMPLETE (UACMP) WITH MICROSCOPIC
Bacteria, UA: NONE SEEN
Bilirubin Urine: NEGATIVE
Glucose, UA: NEGATIVE mg/dL
Hgb urine dipstick: NEGATIVE
KETONES UR: NEGATIVE mg/dL
Nitrite: NEGATIVE
PROTEIN: 30 mg/dL — AB
Specific Gravity, Urine: 1.009 (ref 1.005–1.030)
pH: 7 (ref 5.0–8.0)

## 2017-01-28 LAB — URINE DRUG SCREEN, QUALITATIVE (ARMC ONLY)
Amphetamines, Ur Screen: NOT DETECTED
Barbiturates, Ur Screen: NOT DETECTED
Benzodiazepine, Ur Scrn: NOT DETECTED
CANNABINOID 50 NG, UR ~~LOC~~: NOT DETECTED
COCAINE METABOLITE, UR ~~LOC~~: NOT DETECTED
MDMA (ECSTASY) UR SCREEN: NOT DETECTED
Methadone Scn, Ur: NOT DETECTED
OPIATE, UR SCREEN: NOT DETECTED
PHENCYCLIDINE (PCP) UR S: NOT DETECTED
TRICYCLIC, UR SCREEN: NOT DETECTED

## 2017-01-28 LAB — CBC
HCT: 43.7 % (ref 35.0–47.0)
Hemoglobin: 15 g/dL (ref 12.0–16.0)
MCH: 28.5 pg (ref 26.0–34.0)
MCHC: 34.2 g/dL (ref 32.0–36.0)
MCV: 83.2 fL (ref 80.0–100.0)
PLATELETS: 301 10*3/uL (ref 150–440)
RBC: 5.26 MIL/uL — AB (ref 3.80–5.20)
RDW: 13.2 % (ref 11.5–14.5)
WBC: 9.7 10*3/uL (ref 3.6–11.0)

## 2017-01-28 LAB — PREGNANCY, URINE: PREG TEST UR: NEGATIVE

## 2017-01-28 LAB — SALICYLATE LEVEL

## 2017-01-28 LAB — ETHANOL

## 2017-01-28 LAB — TROPONIN I: Troponin I: <0.03 ng/mL (ref <0.03)

## 2017-01-28 LAB — ACETAMINOPHEN LEVEL

## 2017-01-28 MED ORDER — ACETAMINOPHEN 500 MG PO TABS: 1000.0000 mg | ORAL_TABLET | Freq: Once | ORAL | Status: DC

## 2017-01-28 MED ORDER — ACETAMINOPHEN 500 MG PO TABS
ORAL_TABLET | ORAL | Status: AC
  Administered 2017-01-28: 1000 mg
  Filled 2017-01-28: qty 2

## 2017-01-28 MED ORDER — TRAZODONE HCL 100 MG PO TABS
ORAL_TABLET | ORAL | Status: AC
  Administered 2017-01-28: 100 mg
  Filled 2017-01-28: qty 1

## 2017-01-28 MED ORDER — SODIUM CHLORIDE 0.9 % IV SOLN
Freq: Once | INTRAVENOUS | Status: AC
Start: 2017-01-28 — End: 2017-01-28
  Administered 2017-01-28: 15:00:00 via INTRAVENOUS

## 2017-01-28 MED ORDER — TRAZODONE HCL 100 MG PO TABS: 100.0000 mg | ORAL_TABLET | Freq: Every day | ORAL | Status: DC

## 2017-01-28 NOTE — ED Notes (Signed)
BEHAVIORAL HEALTH ROUNDING  Patient sleeping: No.  Patient alert and oriented: yes  Behavior appropriate: Yes. ; If no, describe:  Nutrition and fluids offered: Yes  Toileting and hygiene offered: Yes  Sitter present: not applicable, Q 15 min safety rounds and observation.  Law enforcement present: Yes ODS  

## 2017-01-28 NOTE — ED Triage Notes (Signed)
[place] with IVC papers on the way. Pt reports that her husband and her had an argument and were talking about divorce so she took two handfuls of Triamterene/HCT (about 30) pills. She was able to vomit some of them up. She took pills around 1245 today.

## 2017-01-28 NOTE — [facility] Counselor (Addendum)
Patient has been accepted to [facility].  Patient assigned to room TBD Accepting physician is Dr. Otho Perlaj Thotakura.  Call report to [phone].  Representative was [employer].  ER Staff is aware of it Olegario Messier([facility] & Claris CheMargaret Patient's Nurse)     The pt can arrive at any time.

## 2017-01-28 NOTE — BH Assessment (Signed)
Assessment Note  Sonya [name] is an 26 y.o. female who [facility] to a suicide attempt by taking prescription medication for vertigo. Pt states she took this medication after calling her husband's place of work and her mother, who then both arrived at the home. She states the police then handcuffed her and brought her to the hospital. She states that she now believes that she "did it for attention from [her] husband."  Pt reports she has been receiving psychiatric care from a doctor who specializes in psychiatric medicine in the same office/building as her primary care physician. She states she started seeing this person after the birth of her two-year-old daughter; she states prior to the birth of her daughter she had no depressive symptoms. Pt states she has had thoughts of suicide 4-5x in the past.   Pt reports her depressive symptoms include feeling sad, a letdown to everybody, that she can't do anything right, and that she has no motivation. Pt states she has been considering quitting her job because it's hard for her to go in to work.   Pt denies any self-harm. She denies any substance use or abuse. She states her father passed away from an overdose when she was 26 years old; she believes he was using crack or cocaine. She states her older brother also has depression and has attempted suicide several times. Pt reports her father was an alcoholic and that her sister is addicted to pills. She denies a history of prior inpatient services and does not currently see a therapist.  Diagnosis: Major Depressive Disorder  Past Medical History:  Past Medical History:  Diagnosis Date  . Vertigo     History reviewed. No pertinent surgical history.  Family History: History reviewed. No pertinent family history.  Social History:  reports that she has quit smoking. she has never used smokeless tobacco. She reports that she does not drink alcohol or use  drugs.  Additional Social History:  Alcohol / Drug Use Pain Medications: None reported Prescriptions: None reported Over the Counter: None reported History of alcohol / drug use?: No history of alcohol / drug abuse Longest period of sobriety (when/how long): N/A  CIWA: CIWA-Ar BP: 123/85 Pulse Rate: (!) 102 COWS:    Allergies:  Allergies  Allergen Reactions  . Codeine Nausea And Vomiting    Home Medications:  (Not in a hospital admission)  OB/GYN Status:  No LMP recorded. Patient has had an injection.  General Assessment Data Location of Assessment: [facility] ED TTS Assessment: In system Is this a Tele or Face-to-Face Assessment?: Face-to-Face Is this an Initial Assessment or a Re-assessment for this encounter?: Initial Assessment Marital status: Married RosaliaMaiden name: Colette RibasByrd Is patient pregnant?: No Pregnancy Status: No Living Arrangements: Spouse/significant other Can pt return to current living arrangement?: Yes Admission Status: Involuntary Is patient capable of signing voluntary admission?: No Referral Source: Other(Sheriff's Department) [facility]: [facility]  Medical Screening Exam [facility] Walk-in ONLY) Medical Exam completed: Yes  Crisis Care Plan Living Arrangements: Spouse/significant other Legal Guardian: Other:(N/A) Name of Psychiatrist: Doctor TurkeyVictoria (last name unknown) Name of Therapist: N/A  Education Status Is patient currently in school?: No Current Grade: N/A Highest grade of school patient has completed: Tech Degree Name of school: N/A Contact person: N/A  Risk to self with the past 6 months Suicidal Ideation: Yes-Currently Present Has patient been a risk to self within the past 6 months prior to admission? : Yes Suicidal Intent: Yes-Currently  Present Has patient had any suicidal intent within the past 6 months prior to admission? : Yes Is patient at risk for suicide?: Yes Suicidal Plan?: Yes-Currently Present Has patient had any suicidal  plan within the past 6 months prior to admission? : Yes Specify Current Suicidal Plan: (Pt denies she is suicidal any longer) Access to Means: Yes Specify Access to Suicidal Means: Pt has accessibility to medication What has been your use of drugs/alcohol within the last 12 months?: N/A Previous Attempts/Gestures: Yes How many times?: 1 Other Self Harm Risks: 4-5x has had thoughts Triggers for Past Attempts: Spouse contact, Family contact Intentional Self Injurious Behavior: None Family Suicide History: (Brother has attempted several times) Recent stressful life event(s): Conflict (Comment)(Conflict with husband) Persecutory voices/beliefs?: No Depression: Yes Depression Symptoms: Tearfulness, Guilt, Loss of interest in usual pleasures, Feeling worthless/self pity, Fatigue Substance abuse history and/or treatment for substance abuse?: No Suicide prevention information given to non-admitted patients: Not applicable  Risk to Others within the past 6 months Homicidal Ideation: No Does patient have any lifetime risk of violence toward others beyond the six months prior to admission? : No Thoughts of Harm to Others: No Current Homicidal Intent: No Current Homicidal Plan: No Access to Homicidal Means: No Identified Victim: N/A History of harm to others?: No Assessment of Violence: None Noted Violent Behavior Description: N/A Does patient have access to weapons?: No Criminal Charges Pending?: No Does patient have a court date: No Is patient on probation?: No  Psychosis Hallucinations: None noted Delusions: None noted  Mental Status Report Appearance/Hygiene: In scrubs, Unremarkable Eye Contact: Good Motor Activity: Unremarkable Speech: Aphasic, Unremarkable Level of Consciousness: Alert, Restless Mood: Apprehensive, Despair, Sullen Affect: Sullen Anxiety Level: Moderate Thought Processes: Coherent, Circumstantial Judgement: Impaired Orientation: Person, Place, Time,  Situation Obsessive Compulsive Thoughts/Behaviors: None  Cognitive Functioning Concentration: Fair Memory: Recent Intact, Remote Intact IQ: Average Insight: Poor Impulse Control: Poor Appetite: Good Sleep: Increased Vegetative Symptoms: Staying in bed  ADLScreening [facility] Assessment Services) Patient's cognitive ability adequate to safely complete daily activities?: Yes Patient able to express need for assistance with ADLs?: Yes Independently performs ADLs?: Yes (appropriate for developmental age)  Prior Inpatient Therapy Prior Inpatient Therapy: No  Prior Outpatient Therapy Prior Outpatient Therapy: No Does patient have an ACCT team?: No Does patient have Intensive In-House Services?  : No Does patient have [facility] services? : No Does patient have P4CC services?: No  ADL Screening (condition at time of admission) Patient's cognitive ability adequate to safely complete daily activities?: Yes Patient able to express need for assistance with ADLs?: Yes Independently performs ADLs?: Yes (appropriate for developmental age)             Merchant navy officerAdvance Directives (For Healthcare) Does Patient Have a Medical Advance Directive?: No Would patient like information on creating a medical advance directive?: No - Patient declined    Additional Information 1:1 In Past 12 Months?: No CIRT Risk: No Elopement Risk: No Does patient have medical clearance?: No     Disposition:     On Site Evaluation by:   Reviewed with Physician:    Ralph DowdySamantha L Isella Slatten 01/28/2017 6:31 PM

## 2017-01-28 NOTE — ED Notes (Signed)
Patient's mother's phone number:  [phone]

## 2017-01-28 NOTE — ED Notes (Signed)
Report to Minerva AreolaEric, RN in the ED [facility],

## 2017-01-28 NOTE — ED Notes (Signed)
Report given to tele psych.. Patient set up for tele psych assessment.

## 2017-01-28 NOTE — ED Notes (Addendum)
Pt is alert and oriented on admission to the B[facility]. Pt mood is appropriate and affect is sad. Writer dicussed tx plan and pt understands circumstances. Pt denies SI/HI and AVH as well. Pt is c/o HA and insomnia. Will request PRN medications. 15 minute checks ongoing for safety.

## 2017-01-28 NOTE — ED Provider Notes (Signed)
[facility] Emergency Department Provider Note       Time seen: ----------------------------------------- 2:41 PM on 01/28/2017 -----------------------------------------   I have reviewed the triage vital signs and the nursing notes.  HISTORY   Chief Complaint Suicide Attempt    HPI Sonya [name] is a 26 y.o. female with a history of vertigo who presents to the ED for overdose.  Patient states around 1230 she took around 30 tablets of triamterene/HCTZ.  Patient states she was in an argument with her husband last night and was depressed thinking about this morning.  She then took a handful of pills to try to harm herself.  Subsequently she vomited up most of the tablets.  She denies feeling like this before.  She states she was feeling lightheaded earlier but this has resolved.  Past Medical History:  Diagnosis Date  . Vertigo     Patient Active Problem List   Diagnosis Date Noted  . Indication for care in labor and delivery, delivered 12/26/2014  . Indication for care or intervention related to labor and delivery 12/23/2014  . Labor and delivery, indication for care 10/13/2014    History reviewed. No pertinent surgical history.  Allergies Codeine  Social History Social History   Tobacco Use  . Smoking status: Former Games developermoker  . Smokeless tobacco: Never Used  Substance Use Topics  . Alcohol use: No  . Drug use: No    Review of Systems Constitutional: Negative for fever. Cardiovascular: Negative for chest pain. Respiratory: Negative for shortness of breath. Gastrointestinal: Negative for abdominal pain, vomiting and diarrhea. Genitourinary: Negative for dysuria. Musculoskeletal: Negative for back pain. Skin: Negative for rash. Neurological: Negative for headaches, focal weakness or numbness. Psychiatric: Positive for suicidal ideation  All systems negative/normal/unremarkable except as stated in the  HPI  ____________________________________________   PHYSICAL EXAM:  VITAL SIGNS: ED Triage Vitals  Enc Vitals Group     BP 01/28/17 1407 (!) 143/90     Pulse Rate 01/28/17 1407 (!) 109     Resp 01/28/17 1407 16     Temp 01/28/17 1407 98.7 F (37.1 C)     Temp Source 01/28/17 1407 Oral     SpO2 01/28/17 1407 97 %     Weight 01/28/17 1408 190 lb (86.2 kg)     Height 01/28/17 1408 5\' 2"  (1.575 m)     Head Circumference --      Peak Flow --      Pain Score --      Pain Loc --      Pain Edu? --      Excl. in GC? --     Constitutional: Alert and oriented. Well appearing and in no distress. Eyes: Conjunctivae are normal. Normal extraocular movements. ENT   Head: Normocephalic and atraumatic.   Nose: No congestion/rhinnorhea.   Mouth/Throat: Mucous membranes are moist.   Neck: No stridor. Cardiovascular: Normal rate, regular rhythm. No murmurs, rubs, or gallops. Respiratory: Normal respiratory effort without tachypnea nor retractions. Breath sounds are clear and equal bilaterally. No wheezes/rales/rhonchi. Gastrointestinal: Soft and nontender. Normal bowel sounds Musculoskeletal: Nontender with normal range of motion in extremities. No lower extremity tenderness nor edema. Neurologic:  Normal speech and language. No gross focal neurologic deficits are appreciated.  Skin:  Skin is warm, dry and intact. No rash noted. Psychiatric: Mood and affect are normal. Speech and behavior are normal.  ____________________________________________  EKG: Interpreted by me.  Sinus rhythm the rate of 90 bpm, normal PR interval, normal QRS,  normal QT.  ____________________________________________  ED COURSE:  Pertinent labs & imaging results that were available during my care of the patient were reviewed by me and considered in my medical decision making (see chart for details). Patient presents for overdose, we will assess with labs and imaging as indicated.    Procedures ____________________________________________   LABS (pertinent positives/negatives)  Labs Reviewed  COMPREHENSIVE METABOLIC PANEL - Abnormal; Notable for the following components:      Result Value   Potassium 3.3 (*)    Glucose, Bld 103 (*)    Total Protein 9.0 (*)    All other components within normal limits  ACETAMINOPHEN LEVEL - Abnormal; Notable for the following components:   Acetaminophen (Tylenol), Serum <10 (*)    All other components within normal limits  CBC - Abnormal; Notable for the following components:   RBC 5.26 (*)    All other components within normal limits  ETHANOL  SALICYLATE LEVEL  URINE DRUG SCREEN, QUALITATIVE ([facility] ONLY)  TROPONIN I  URINALYSIS, COMPLETE (UACMP) WITH MICROSCOPIC  POC URINE PREG, ED  ____________________________________________  DIFFERENTIAL DIAGNOSIS   Overdose, malingering, dehydration, electrolyte abnormality, pregnancy  FINAL ASSESSMENT AND PLAN  Overdose   Plan: Patient had presented for a drug overdose that occurred 2 hours prior to arrival. Patient's labs are reassuring.  She is in no distress and is stable for observation under involuntary commitment.   Emily FilbertWilliams, Jonathan E, MD   Note: This note was generated in part or whole with voice recognition software. Voice recognition is usually quite accurate but there are transcription errors that can and very often do occur. I apologize for any typographical errors that were not detected and corrected.     Emily FilbertWilliams, Jonathan E, MD 01/28/17 [phone]

## 2017-01-28 NOTE — ED Notes (Signed)
The patient was dressed out into the required purple scrubs. Her belongings were placed into a white patient belongings bag and labeled as required by [facility] policy.  RN K. Pendalton and myself in the room during the dress-out period. No incidents during this time. Patient very cooperative.

## 2017-01-28 NOTE — ED Notes (Signed)

## 2017-01-29 NOTE — ED Notes (Signed)
Pt d/c with [facility] to [facility].

## 2017-01-29 NOTE — ED Provider Notes (Signed)
-----------------------------------------   6:46 AM on 01/29/2017 -----------------------------------------   Blood pressure 112/76, pulse 90, temperature 98.3 F (36.8 C), temperature source Oral, resp. rate 18, height 5\' 2"  (1.575 m), weight 86.2 kg (190 lb), SpO2 100 %, unknown if currently breastfeeding.  The patient had no acute events since last update.  Sleeping at this time.  Disposition is pending Psychiatry/Behavioral Medicine team recommendations.     Irean HongSung, Aristeo Hankerson J, MD 01/29/17 [phone]

## 2017-01-29 NOTE — ED Notes (Signed)
EMTALA reviewed. 

## 2017-01-29 NOTE — ED Notes (Signed)
Pt is being d/c to [facility]. Pt verbalized understanding. Pt denies pain at time of d/c. Pt verbalize suicidal ideation without a plan. Pt denies AH/VH/HI at this time. Pt dress in paper scrubs upon d/c. All belongings transferred with patient.

## 2017-02-08 ENCOUNTER — Other Ambulatory Visit: Payer: Self-pay

## 2017-02-08 ENCOUNTER — Emergency Department
Admission: EM | Admit: 2017-02-08 | Discharge: 2017-02-08 | Disposition: A | Discharge status: Home / Self Care | Payer: BC Managed Care – PPO | Attending: Emergency Medicine | Admitting: Emergency Medicine

## 2017-02-08 ENCOUNTER — Encounter: Payer: Self-pay | Admitting: Emergency Medicine

## 2017-02-08 DIAGNOSIS — Z87891 Personal history of nicotine dependence: Secondary | ICD-10-CM | POA: Diagnosis not present

## 2017-02-08 DIAGNOSIS — R110 Nausea: Secondary | ICD-10-CM | POA: Diagnosis present

## 2017-02-08 DIAGNOSIS — A09 Infectious gastroenteritis and colitis, unspecified: Secondary | ICD-10-CM | POA: Diagnosis not present

## 2017-02-08 DIAGNOSIS — N3000 Acute cystitis without hematuria: Secondary | ICD-10-CM | POA: Diagnosis not present

## 2017-02-08 LAB — URINALYSIS, COMPLETE (UACMP) WITH MICROSCOPIC
Bilirubin Urine: NEGATIVE
GLUCOSE, UA: NEGATIVE mg/dL
KETONES UR: NEGATIVE mg/dL
Nitrite: NEGATIVE
PROTEIN: 30 mg/dL — AB
Specific Gravity, Urine: 1.025 (ref 1.005–1.030)
pH: 5 (ref 5.0–8.0)

## 2017-02-08 LAB — CBC
HEMATOCRIT: 41.6 % (ref 35.0–47.0)
HEMOGLOBIN: 14 g/dL (ref 12.0–16.0)
MCH: 28 pg (ref 26.0–34.0)
MCHC: 33.6 g/dL (ref 32.0–36.0)
MCV: 83.4 fL (ref 80.0–100.0)
Platelets: 290 10*3/uL (ref 150–440)
RBC: 4.99 MIL/uL (ref 3.80–5.20)
RDW: 13.6 % (ref 11.5–14.5)
WBC: 9.6 10*3/uL (ref 3.6–11.0)

## 2017-02-08 LAB — COMPREHENSIVE METABOLIC PANEL
ALBUMIN: 4.3 g/dL (ref 3.5–5.0)
ALT: 21 U/L (ref 14–54)
ANION GAP: 9 (ref 5–15)
AST: 22 U/L (ref 15–41)
Alkaline Phosphatase: 54 U/L (ref 38–126)
BUN: 13 mg/dL (ref 6–20)
CHLORIDE: 108 mmol/L (ref 101–111)
CO2: 22 mmol/L (ref 22–32)
Calcium: 9.9 mg/dL (ref 8.9–10.3)
Creatinine, Ser: 0.7 mg/dL (ref 0.44–1.00)
GFR calc Af Amer: >60 mL/min (ref >60)
GFR calc non Af Amer: >60 mL/min (ref >60)
GLUCOSE: 101 mg/dL — AB (ref 65–99)
POTASSIUM: 3.6 mmol/L (ref 3.5–5.1)
Sodium: 139 mmol/L (ref 135–145)
Total Bilirubin: 0.7 mg/dL (ref 0.3–1.2)
Total Protein: 8.4 g/dL — ABNORMAL HIGH (ref 6.5–8.1)

## 2017-02-08 LAB — LIPASE, BLOOD: LIPASE: 31 U/L (ref 11–51)

## 2017-02-08 LAB — POCT PREGNANCY, URINE: PREG TEST UR: NEGATIVE

## 2017-02-08 MED ORDER — ONDANSETRON 4 MG PO TBDP: 4.0000 mg | ORAL_TABLET | Freq: Three times a day (TID) | ORAL | 0 refills | Status: DC | PRN

## 2017-02-08 MED ORDER — SODIUM CHLORIDE 0.9 % IV SOLN
Freq: Once | INTRAVENOUS | Status: AC
  Administered 2017-02-08: 14:00:00 via INTRAVENOUS

## 2017-02-08 MED ORDER — CEFTRIAXONE SODIUM 1 G IJ SOLR
1.0000 g | Freq: Once | INTRAMUSCULAR | Status: AC
  Administered 2017-02-08: 1 g via INTRAVENOUS

## 2017-02-08 MED ORDER — ONDANSETRON HCL 4 MG/2ML IJ SOLN
INTRAMUSCULAR | Status: AC
Start: 2017-02-08 — End: 2017-02-08
  Administered 2017-02-08: 4 mg via INTRAVENOUS
  Filled 2017-02-08: qty 2

## 2017-02-08 MED ORDER — ONDANSETRON HCL 4 MG/2ML IJ SOLN
4.0000 mg | Freq: Once | INTRAMUSCULAR | Status: AC
  Administered 2017-02-08: 4 mg via INTRAVENOUS

## 2017-02-08 MED ORDER — PROBIOTIC 250 MG PO CAPS: 1.0000 | ORAL_CAPSULE | Freq: Two times a day (BID) | ORAL | 1 refills | Status: DC

## 2017-02-08 MED ORDER — CEFTRIAXONE SODIUM IN DEXTROSE 20 MG/ML IV SOLN
INTRAVENOUS | Status: AC
  Filled 2017-02-08: qty 50

## 2017-02-08 MED ORDER — SULFAMETHOXAZOLE-TRIMETHOPRIM 800-160 MG PO TABS: 1.0000 | ORAL_TABLET | Freq: Two times a day (BID) | ORAL | 0 refills | Status: DC

## 2017-02-08 NOTE — ED Notes (Signed)
Pt reports nausea has decreased after medication.

## 2017-02-08 NOTE — ED Provider Notes (Signed)
[facility] Emergency Department Provider Note       Time seen: ----------------------------------------- 1:34 PM on 02/08/2017 -----------------------------------------   I have reviewed the triage vital signs and the nursing notes.  HISTORY   Chief Complaint Nausea and Diarrhea    HPI Sonya [name] is a 26 y.o. female with a history of vertigo who presents to the ED for nausea especially after eating and diarrhea in small amounts.  Patient has had the symptoms for about 3 days.  She has not had any abdominal pain, just feels nauseous and weak.  She denies fevers, chills or other complaints.  Past Medical History:  Diagnosis Date  . Vertigo     Patient Active Problem List   Diagnosis Date Noted  . Indication for care in labor and delivery, delivered 12/26/2014  . Indication for care or intervention related to labor and delivery 12/23/2014  . Labor and delivery, indication for care 10/13/2014    History reviewed. No pertinent surgical history.  Allergies Codeine  Social History Social History   Tobacco Use  . Smoking status: Former Games developermoker  . Smokeless tobacco: Never Used  Substance Use Topics  . Alcohol use: No  . Drug use: No    Review of Systems Constitutional: Negative for fever. Cardiovascular: Negative for chest pain. Respiratory: Negative for shortness of breath. Gastrointestinal: Positive for nausea and diarrhea Genitourinary: Negative for dysuria. Musculoskeletal: Negative for back pain. Skin: Negative for rash. Neurological: Negative for headaches, focal weakness or numbness.  All systems negative/normal/unremarkable except as stated in the HPI  ____________________________________________   PHYSICAL EXAM:  VITAL SIGNS: ED Triage Vitals [02/08/17 1030]  Enc Vitals Group     BP 131/76     Pulse Rate 95     Resp 18     Temp 98.4 F (36.9 C)     Temp Source Oral     SpO2 97 %     Weight 185 lb (83.9 kg)   Height 5\' 2"  (1.575 m)     Head Circumference      Peak Flow      Pain Score      Pain Loc      Pain Edu?      Excl. in GC?     Constitutional: Alert and oriented. Well appearing and in no distress. Eyes: Conjunctivae are normal. Normal extraocular movements. ENT   Head: Normocephalic and atraumatic.   Nose: No congestion/rhinnorhea.   Mouth/Throat: Mucous membranes are moist.   Neck: No stridor. Cardiovascular: Normal rate, regular rhythm. No murmurs, rubs, or gallops. Respiratory: Normal respiratory effort without tachypnea nor retractions. Breath sounds are clear and equal bilaterally. No wheezes/rales/rhonchi. Gastrointestinal: Soft and nontender. Normal bowel sounds.  No CVA tenderness Musculoskeletal: Nontender with normal range of motion in extremities. No lower extremity tenderness nor edema. Neurologic:  Normal speech and language. No gross focal neurologic deficits are appreciated.  Skin:  Skin is warm, dry and intact. No rash noted. Psychiatric: Mood and affect are normal. Speech and behavior are normal.  ____________________________________________  ED COURSE:  Pertinent labs & imaging results that were available during my care of the patient were reviewed by me and considered in my medical decision making (see chart for details). Patient presents for nausea and diarrhea, we will assess with labs and imaging as indicated.   Procedures ____________________________________________   LABS (pertinent positives/negatives)  Labs Reviewed  COMPREHENSIVE METABOLIC PANEL - Abnormal; Notable for the following components:      Result Value  Glucose, Bld 101 (*)    Total Protein 8.4 (*)    All other components within normal limits  URINALYSIS, COMPLETE (UACMP) WITH MICROSCOPIC - Abnormal; Notable for the following components:   Color, Urine YELLOW (*)    APPearance TURBID (*)    Hgb urine dipstick SMALL (*)    Protein, ur 30 (*)    Leukocytes, UA MODERATE  (*)    Bacteria, UA FEW (*)    Squamous Epithelial / LPF 6-30 (*)    All other components within normal limits  LIPASE, BLOOD  CBC  POCT PREGNANCY, URINE  POC URINE PREG, ED  ____________________________________________  DIFFERENTIAL DIAGNOSIS   Gastroenteritis, dehydration, electrolyte abnormality, UTI, pyelonephritis  FINAL ASSESSMENT AND PLAN  UTI, diarrhea   Plan: Patient had presented for nausea and diarrhea. Patient's labs were indicative of a urinary tract infection.  She did not require any imaging during this visit.  We did give her a liter of fluids as well as IV Rocephin and sent a urine culture.  Overall she is stable for outpatient follow-up.   Emily FilbertWilliams, Vernetta Dizdarevic E, MD   Note: This note was generated in part or whole with voice recognition software. Voice recognition is usually quite accurate but there are transcription errors that can and very often do occur. I apologize for any typographical errors that were not detected and corrected.     Emily FilbertWilliams, Nguyen Butler E, MD 02/08/17 1336

## 2017-02-08 NOTE — ED Notes (Signed)
MD at beside

## 2017-02-08 NOTE — ED Triage Notes (Signed)
Says nausea, esp after eating and diarrhea in small amounts.  Has had for e3 days.  Says no abd pain, just weak and nausea.

## 2017-02-10 LAB — URINE CULTURE: SPECIAL REQUESTS: NORMAL

## 2017-02-23 ENCOUNTER — Encounter: Payer: Self-pay | Admitting: Psychiatry

## 2017-02-23 ENCOUNTER — Ambulatory Visit (INDEPENDENT_AMBULATORY_CARE_PROVIDER_SITE_OTHER): Payer: BC Managed Care – PPO | Admitting: Psychiatry

## 2017-02-23 VITALS — BP 104/73 | Ht 62.0 in | Wt 194.2 lb

## 2017-02-23 DIAGNOSIS — F331 Major depressive disorder, recurrent, moderate: Secondary | ICD-10-CM | POA: Diagnosis not present

## 2017-02-23 DIAGNOSIS — F411 Generalized anxiety disorder: Secondary | ICD-10-CM | POA: Diagnosis not present

## 2017-02-23 MED ORDER — ARIPIPRAZOLE 5 MG PO TABS: 5.0000 mg | ORAL_TABLET | Freq: Every day | ORAL | 0 refills | Status: DC

## 2017-02-23 MED ORDER — TRAZODONE HCL 100 MG PO TABS: 100.0000 mg | ORAL_TABLET | Freq: Every day | ORAL | 2 refills | Status: DC

## 2017-02-23 MED ORDER — FLUOXETINE HCL 10 MG PO CAPS: 10.0000 mg | ORAL_CAPSULE | Freq: Every day | ORAL | 1 refills | Status: DC

## 2017-02-23 NOTE — Progress Notes (Signed)
Psychiatric Initial Adult Assessment   Patient Identification: Sonya [name] MRN:  [id number] Date of Evaluation:  02/23/2017 Referral Source: [facility] Chief Complaint:  " I am still anxious.'  Chief Complaint    Depression     Visit Diagnosis:    ICD-10-CM   1. MDD (major depressive disorder), recurrent episode, moderate (HCC) F33.1 ARIPiprazole (ABILIFY) 5 MG tablet    FLUoxetine (PROZAC) 10 MG capsule    traZODone (DESYREL) 100 MG tablet    History of Present Illness: Sonya [name] is a 26 year old Caucasian female who is currently unemployed, married, lives in [place], has a history of depression and anxiety who presented to the clinic to establish care.  Sonya [name] was recently discharged from [facility] few weeks ago.  Reports that she was admitted to [facility] for a suicide attempt by overdose.  She reports that she was having some relationship issues with her husband and they had a discussion about getting a divorce.  She reports that the next day morning she overdosed on some of her fluid pills and and called her husband.  She reports that she threw up most of the pills and hence it did not affect her too much.  She reports that she has been dealing with depressive symptoms since the birth of her-25-year-old daughter Sonya Apo'.  She reports that she has periods when she is sad, has crying spells, self hurt thoughts, sleep issues, decreased appetite, low energy and so on.  She reports that she also has anxiety symptoms.  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  She does report some self-injurious behaviors in the past.  She reports that she went through a phase when she would cut herself to get emotional relief.  She reports she has outgrown that right now.  She reports her relationship with her husband is not too good.  She reports that he gets mad for no reason and he is not very supportive about her mental health issues.  She also  struggles with the thought that she will be on but unemployed and will not be able to have an income for the rest of her life.  He has a degree in pharmacy tech.  She used to work with the [employer] and also with [employer] in the past.  She reports that because of her mental health issues she had crying spells and inability to concentrate at work which made her lose her job.  She reports she is currently not in a situation to get back out there and apply for other jobs.  She wants to get a better first.  She reports that she did not seek help for her depression for a long time.  She reports that she eventually went to her PMD who started her on medications.  She she reports she tried medications like Lexapro-did not work, Wellbutrin-did not work-Pristiq-gave her panic attacks-currently on Luvox- making her depression worse.  She reports that she is currently on Abilify which she takes daily and has not is tolerating it okay.  She does have some jitteriness to the Abilify.  She reports that she stopped the Luvox that was started by her PMD.  She reports that she did not tolerate it well.  She denies any manic or hypomanic symptoms.  She denies any perceptual disturbances.  She denies any past history of trauma.  She denies any OCD symptoms.  She denies any history of substance abuse problems.  She reports  her mother is very supportive.  She reports she wants to take care of her daughter Sonya [name] and be there for her.  She does have Mnire's disease which is currently being managed by her PMD.    Associated Signs/Symptoms: Depression Symptoms:  depressed mood, psychomotor retardation, anxiety, decreased appetite, (Hypo) Manic Symptoms:  denies Anxiety Symptoms:  Excessive Worry, Psychotic Symptoms:  denies PTSD Symptoms: Negative  Past Psychiatric History: History of depression and anxiety.  She was being treated by her PMD.  She reports 1 [facility].  She was  discharged a few weeks ago.  She reports a history of self-injurious behaviors of cutting in the past.  She reports she has outgrown it now.  She does report 1 overdose/suicide attempt recently when she tried to overdose on her hydrochlorothiazide pills.  She reports it as an impulsive act since she had a conversation with her husband about divorce the previous night.  Previous Psychotropic Medications: Yes - lexapro ( did not work) , wellbutrin ( helped to quit smoking - did not help depression) , luvox ( made her sx worse) , pristiq ( panic attacks)    Substance Abuse History in the last 12 months:  No.  Consequences of Substance Abuse: Negative  Past Medical History:  Past Medical History:  Diagnosis Date  . Meniere disease UNKNOWN  . Vertigo    No past surgical history on file.  Family Psychiatric History: Father (deceased) -drug abuse, sister-drug abuse.  She denies any history of suicide in her family.  She denies any history of other mental health problems in her family.  Family History:  Family History  Problem Relation Age of Onset  . Graves' disease Mother   . Drug abuse Father   . Drug abuse Sister     Social History:   Social History   Socioeconomic History  . Marital status: Married    Spouse name: None  . Number of children: None  . Years of education: None  . Highest education level: None  Social Needs  . Financial resource strain: None  . Food insecurity - worry: None  . Food insecurity - inability: None  . Transportation needs - medical: None  . Transportation needs - non-medical: None  Occupational History  . None  Tobacco Use  . Smoking status: Former Research scientist (life sciences)  . Smokeless tobacco: Never Used  Substance and Sexual Activity  . Alcohol use: No  . Drug use: No  . Sexual activity: Yes  Other Topics Concern  . None  Social History Narrative  . None    Additional Social History: She is married.  She has a 20-year-old daughter Sonya [name]).  She used  to work as a Occupational psychologist at [employer].  She currently is unemployed.  Her husband works and supports her financially.  She does have social support from her mother.  Allergies:   Allergies  Allergen Reactions  . Codeine Nausea And Vomiting    Metabolic Disorder Labs: No results found for: HGBA1C, MPG No results found for: PROLACTIN No results found for: CHOL, TRIG, HDL, CHOLHDL, VLDL, LDLCALC   Current Medications: Current Outpatient Medications  Medication Sig Dispense Refill  . ARIPiprazole (ABILIFY) 5 MG tablet Take 1 tablet (5 mg total) by mouth daily. 90 tablet 0  . FLUoxetine (PROZAC) 10 MG capsule Take 1 capsule (10 mg total) by mouth daily. 30 capsule 1  . hydrOXYzine (ATARAX/VISTARIL) 10 MG tablet Take 10 mg by mouth 3 (three) times daily as needed for  anxiety.    . meclizine (ANTIVERT) 25 MG tablet Take 25 mg by mouth 2 (two) times daily as needed for dizziness.    . medroxyPROGESTERone (DEPO-PROVERA) 150 MG/ML injection Inject 150 mg into the muscle every 3 (three) months. Reported on 07/03/2015    . ondansetron (ZOFRAN ODT) 4 MG disintegrating tablet Take 1 tablet (4 mg total) by mouth every 8 (eight) hours as needed for nausea or vomiting. 20 tablet 0  . ondansetron (ZOFRAN) 4 MG tablet Take 1 tablet (4 mg total) by mouth every 8 (eight) hours as needed for nausea or vomiting. 20 tablet 0  . pantoprazole (PROTONIX) 20 MG tablet Take 1 tablet (20 mg total) by mouth daily. (Patient not taking: Reported on 01/28/2017) 30 tablet 1  . Saccharomyces boulardii (PROBIOTIC) 250 MG CAPS Take 1 capsule by mouth 2 (two) times daily. 14 capsule 1  . sulfamethoxazole-trimethoprim (BACTRIM DS) 800-160 MG tablet Take 1 tablet by mouth 2 (two) times daily. 6 tablet 0  . traZODone (DESYREL) 100 MG tablet Take 1 tablet (100 mg total) by mouth at bedtime. 30 tablet 2  . triamcinolone cream (KENALOG) 0.1 % Apply 1 application topically 2 (two) times daily. (Patient taking differently: Apply 1  application topically 2 (two) times daily as needed (for eczema). ) 30 g 3  . triamterene-hydrochlorothiazide (DYAZIDE) 37.5-25 MG capsule Take 1 capsule by mouth daily.  5   No current facility-administered medications for this visit.     Neurologic: Headache: No Seizure: No Paresthesias:No  Musculoskeletal: Strength & Muscle Tone: within normal limits Gait & Station: normal Patient leans: N/A  Psychiatric Specialty Exam: Review of Systems  Psychiatric/Behavioral: Positive for depression. The patient is nervous/anxious and has insomnia.   All other systems reviewed and are negative.   Blood pressure 104/73, height '5\' 2"'$  (1.575 m), weight 194 lb 3.2 oz (88.1 kg), unknown if currently breastfeeding.Body mass index is 35.52 kg/m.  General Appearance: Casual  Eye Contact:  Fair  Speech:  Clear and Coherent  Volume:  Normal  Mood:  Anxious, Depressed and Dysphoric  Affect:  Tearful  Thought Process:  Goal Directed and Descriptions of Associations: Intact  Orientation:  Full (Time, Place, and Person)  Thought Content:  Logical  Suicidal Thoughts:  No  Homicidal Thoughts:  No  Memory:  Immediate;   Fair Recent;   Fair Remote;   Fair  Judgement:  Fair  Insight:  Fair  Psychomotor Activity:  Normal  Concentration:  Concentration: Fair and Attention Span: Fair  Recall:  [employer] of Knowledge:Fair  Language: Fair  Akathisia:  No  Handed:  Right  AIMS (if indicated): some on and off jitteriness , otherwise denies, 0    Assets:  Communication Skills Desire for Improvement Housing Social Support Talents/Skills Transportation Vocational/Educational  ADL's:  Intact  Cognition: WNL  Sleep:  poor    Treatment Plan Summary: Kalyssa is a 26 year old Caucasian female who has a history of depression, anxiety who presented to the clinic to establish care.  Eudell recently had a suicide attempt as well as inpatient hospital admission, she was stabilized and discharged few weeks  ago .  She currently is only partially compliant with her medications.  She continues to have psychosocial stressors of relationship issues and unemployment,financial strains and so on.  Amiliah does report some passive suicidal ideation on and off , however she reports she is able to cope with it at this time.  She denies any active plan at this time.  She is open to treatment as well as psychotherapy.  Discussed  IOP referral with her.  She reports she would not be able to do that right now.  But she agrees to see  Probation officer in clinic every 1-2 weeks and also attend psychotherapy here in clinic.  Discussed medication changes with patient , plan as noted below. Medication management and Plan see below  Plan  For depression Discontinue Luvox for lack of compliance, intolerance. Start Prozac 10 mg p.o. daily. Continue Abilify 5 mg p.o. Daily. Refer for CBT  For anxiety Prozac 10 mg p.o. daily Refer for CBT, she will see Ms. Royal Piedra here in clinic on 03/02/2017  Insomnia Trazodone 100 mg p.o. nightly.  Discussed with her to sign a release to obtain medical records from her inpatient hospital admission recently.  Discussed IOP referral if her symptoms worsens, she is not interested at this time.  Spent 15-20 minutes providing supportive psychotherapy.  Also discussed crisis plan with patient-gave her suicide hotlines, crisis chat line or she will call 911 if her symptoms worsens.  She also has support from her mother.  Follow-up in clinic in 1-2 weeks.  She will call the clinic if she has any side effects to the new medication.  More than 50 % of the time was spent for psychoeducation and supportive psychotherapy and care coordination.  This note was generated in part or whole with voice recognition software. Voice recognition is usually quite accurate but there are transcription errors that can and very often do occur. I apologize for any typographical errors that were not detected and  corrected.     Ursula Alert, MD 12/20/201810:43 AM

## 2017-02-23 NOTE — Patient Instructions (Signed)
Fluoxetine capsules or tablets (Depression/Mood Disorders) What is this medicine? FLUOXETINE (floo OX e teen) belongs to a class of drugs known as selective serotonin reuptake inhibitors (SSRIs). It helps to treat mood problems such as depression, obsessive compulsive disorder, and panic attacks. It can also treat certain eating disorders. This medicine may be used for other purposes; ask your health care provider or pharmacist if you have questions. COMMON BRAND NAME(S): Prozac What should I tell my health care provider before I take this medicine? They need to know if you have any of these conditions: -bipolar disorder or a family history of bipolar disorder -bleeding disorders -glaucoma -heart disease -liver disease -low levels of sodium in the blood -seizures -suicidal thoughts, plans, or attempt; a previous suicide attempt by you or a family member -take MAOIs like Carbex, Eldepryl, Marplan, Nardil, and Parnate -take medicines that treat or prevent blood clots -thyroid disease -an unusual or allergic reaction to fluoxetine, other medicines, foods, dyes, or preservatives -pregnant or trying to get pregnant -breast-feeding How should I use this medicine? Take this medicine by mouth with a glass of water. Follow the directions on the prescription label. You can take this medicine with or without food. Take your medicine at regular intervals. Do not take it more often than directed. Do not stop taking this medicine suddenly except upon the advice of your doctor. Stopping this medicine too quickly may cause serious side effects or your condition may worsen. A special MedGuide will be given to you by the pharmacist with each prescription and refill. Be sure to read this information carefully each time. Talk to your pediatrician regarding the use of this medicine in children. While this drug may be prescribed for children as young as 7 years for selected conditions, precautions do  apply. Overdosage: If you think you have taken too much of this medicine contact a poison control center or emergency room at once. NOTE: This medicine is only for you. Do not share this medicine with others. What if I miss a dose? If you miss a dose, skip the missed dose and go back to your regular dosing schedule. Do not take double or extra doses. What may interact with this medicine? Do not take this medicine with any of the following medications: -other medicines containing fluoxetine, like Sarafem or Symbyax -cisapride -linezolid -MAOIs like Carbex, Eldepryl, Marplan, Nardil, and Parnate -methylene blue (injected into a vein) -pimozide -thioridazine This medicine may also interact with the following medications: -alcohol -amphetamines -aspirin and aspirin-like medicines -carbamazepine -certain medicines for depression, anxiety, or psychotic disturbances -certain medicines for migraine headaches like almotriptan, eletriptan, frovatriptan, naratriptan, rizatriptan, sumatriptan, zolmitriptan -digoxin -diuretics -fentanyl -flecainide -furazolidone -isoniazid -lithium -medicines for sleep -medicines that treat or prevent blood clots like warfarin, enoxaparin, and dalteparin -NSAIDs, medicines for pain and inflammation, like ibuprofen or naproxen -phenytoin -procarbazine -propafenone -rasagiline -ritonavir -supplements like St. John's wort, kava kava, valerian -tramadol -tryptophan -vinblastine This list may not describe all possible interactions. Give your health care provider a list of all the medicines, herbs, non-prescription drugs, or dietary supplements you use. Also tell them if you smoke, drink alcohol, or use illegal drugs. Some items may interact with your medicine. What should I watch for while using this medicine? Tell your doctor if your symptoms do not get better or if they get worse. Visit your doctor or health care professional for regular checks on your  progress. Because it may take several weeks to see the full effects of this medicine, it   is important to continue your treatment as prescribed by your doctor. Patients and their families should watch out for new or worsening thoughts of suicide or depression. Also watch out for sudden changes in feelings such as feeling anxious, agitated, panicky, irritable, hostile, aggressive, impulsive, severely restless, overly excited and hyperactive, or not being able to sleep. If this happens, especially at the beginning of treatment or after a change in dose, call your health care professional. Bonita QuinYou may get drowsy or dizzy. Do not drive, use machinery, or do anything that needs mental alertness until you know how this medicine affects you. Do not stand or sit up quickly, especially if you are an older patient. This reduces the risk of dizzy or fainting spells. Alcohol may interfere with the effect of this medicine. Avoid alcoholic drinks. Your mouth may get dry. Chewing sugarless gum or sucking hard candy, and drinking plenty of water may help. Contact your doctor if the problem does not go away or is severe. This medicine may affect blood sugar levels. If you have diabetes, check with your doctor or health care professional before you change your diet or the dose of your diabetic medicine. What side effects may I notice from receiving this medicine? Side effects that you should report to your doctor or health care professional as soon as possible: -allergic reactions like skin rash, itching or hives, swelling of the face, lips, or tongue -anxious -black, tarry stools -breathing problems -changes in vision -confusion -elevated mood, decreased need for sleep, racing thoughts, impulsive behavior -eye pain -fast, irregular heartbeat -feeling faint or lightheaded, falls -feeling agitated, angry, or irritable -hallucination, loss of contact with reality -loss of balance or coordination -loss of memory -painful  or prolonged erections -restlessness, pacing, inability to keep still -seizures -stiff muscles -suicidal thoughts or other mood changes -trouble sleeping -unusual bleeding or bruising -unusually weak or tired -vomiting Side effects that usually do not require medical attention (report to your doctor or health care professional if they continue or are bothersome): -change in appetite or weight -change in sex drive or performance -diarrhea -dry mouth -headache -increased sweating -nausea -tremors This list may not describe all possible side effects. Call your doctor for medical advice about side effects. You may report side effects to FDA at 1-800-FDA-1088. Where should I keep my medicine? Keep out of the reach of children. Store at room temperature between 15 and 30 degrees C (59 and 86 degrees F). Throw away any unused medicine after the expiration date. NOTE: This sheet is a summary. It may not cover all possible information. If you have questions about this medicine, talk to your doctor, pharmacist, or health care provider.  2018 Elsevier/Gold Standard (2015-07-25 15:55:27)    Crisis Hotline ARPA Numbers  [facility] - [phone]  National Suicide Hotline - 1800-273-TALK or 1-800-SUICIDE  Crisis Text Line - Text HOME to 620-093-0376741741  Crisis chat - Lifeline Chat   911

## 2017-02-24 ENCOUNTER — Encounter: Payer: Self-pay | Admitting: Psychiatry

## 2017-03-02 ENCOUNTER — Ambulatory Visit: Payer: BLUE CROSS/BLUE SHIELD | Admitting: Licensed Clinical Social Worker

## 2017-03-02 ENCOUNTER — Ambulatory Visit (INDEPENDENT_AMBULATORY_CARE_PROVIDER_SITE_OTHER): Payer: BC Managed Care – PPO | Admitting: Psychiatry

## 2017-03-02 ENCOUNTER — Encounter: Payer: Self-pay | Admitting: Psychiatry

## 2017-03-02 VITALS — BP 113/80 | HR 89 | Temp 98.3°F | Wt 190.8 lb

## 2017-03-02 DIAGNOSIS — F331 Major depressive disorder, recurrent, moderate: Secondary | ICD-10-CM

## 2017-03-02 MED ORDER — TRAZODONE HCL 100 MG PO TABS: 150.0000 mg | ORAL_TABLET | Freq: Every day | ORAL | 2 refills | Status: DC

## 2017-03-02 MED ORDER — FLUOXETINE HCL 20 MG PO TABS: 20.0000 mg | ORAL_TABLET | Freq: Every day | ORAL | 2 refills | Status: DC

## 2017-03-02 NOTE — Progress Notes (Signed)
BH MD OP Progress Note  03/02/2017 12:30 PM Sonya [name]  MRN:  [id number]  Chief Complaint: ' I am still depressed.   HPI: Sonya [name] is a 26 year old Caucasian female who is currently unemployed, married, lives in [place], has a history of depression, anxiety, who presented to the clinic to for a follow-up visit.  Sonya [name] was recently discharged from [facility].  Sonya [name] today reports that she continues to have some depressive symptoms.  She reports that she feels emotionless at times.  She also reports that she moves slowly and feels like she is detached.  She reports she has been compliant with her Prozac.  She denies any side effects to the Prozac.  Patient being sensitive to medications in general was started on a very small dose of Prozac during her last visit with Clinical research associatewriter.  Patient seems to be tolerating it well.  Discussed  increasing the dose , she agrees with plan.  Patient reports she continues to struggle with sleep issues.  She reports she has been taking the trazodone on and off.  She seems to fall asleep immediately when she takes the trazodone but she continues to struggle to maintain sleep.  She reports she gets up to 3 hours or so on the 100 mg of trazodone.  Discussed increasing her dose.  She agrees with plan.  She continues to have some financial stressors.  She reports she and her husband are currently having some relational issues because of her not being able to provide for the family.  She reports she used to work as a Music therapistpharmacy tech with [employer] and the last day she worked was December 14.  She has applied for short-term disability.  She is awaiting approval.  She reports she wants to apply for long-term disability also because of the waiting.  She denies any suicidality at this time . She denies any perceptual disturbances at this time. She denies any side effects to the medication except for some jitteriness on and off which she seems to cope with and reports she does not  want to be on medications at this time for the same.  She reports she enjoyed Christmas with her family.  She continues to take care of her 25-year-old daughter Sonya [name] and enjoys her. Visit Diagnosis:    ICD-10-CM   1. MDD (major depressive disorder), recurrent episode, moderate (HCC) F33.1 traZODone (DESYREL) 100 MG tablet    FLUoxetine (PROZAC) 20 MG tablet    Past Psychiatric History: Depression and anxiety.  She was being treated by her PMD in the past.  She reports 1 admission- [facility].  She was discharged a month ago.  She reports a history of self-injurious behaviors of cutting in the past.  She reports she has outgrown it now.  She did not report 1 overdose/suicide attempt when she tried to overdose on her hydrochlorothiazide pills.  She reports it as an impulsive act since she had a conversation with her husband about divorce the previous night.  Past trials of medications include Lexapro ( did not work) , Wellbutrin ( helped with smoking cessation) , Luvox( made her worse) , Pristiq( panic attacks)   Past Medical History:  Past Medical History:  Diagnosis Date  . Anxiety   . Depression   . Meniere disease UNKNOWN  . Vertigo     Past Surgical History:  Procedure Laterality Date  . WRIST SURGERY      Family Psychiatric History: Father( deceased)-drug abuse, sister-drug abuse.  She  denies any history of suicide in her family.  she denies any history of other mental health problems in her family  Family History:  Family History  Problem Relation Age of Onset  . Graves' disease Mother   . Drug abuse Father   . Drug abuse Sister   . Depression Brother      Substance abuse history: Denies   Social History:She is married.  She has 25-year-old daughter Sonya [name].  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  She is currently unemployed.  Her husband works and supports her financially.  She has applied for short-term disability.  She does have social support from  home Social History   Socioeconomic History  . Marital status: Married    Spouse name: calvin  . Number of children: 1  . Years of education: None  . Highest education level: Associate degree: occupational, Scientist, product/process developmenttechnical, or vocational program  Social Needs  . Financial resource strain: Hard  . Food insecurity - worry: Often true  . Food insecurity - inability: Often true  . Transportation needs - medical: No  . Transportation needs - non-medical: No  Occupational History    Comment: not employed  Tobacco Use  . Smoking status: Former Games developermoker  . Smokeless tobacco: Never Used  Substance and Sexual Activity  . Alcohol use: No  . Drug use: No  . Sexual activity: Yes  Other Topics Concern  . None  Social History Narrative  . None    Allergies:  Allergies  Allergen Reactions  . Codeine Nausea And Vomiting    Metabolic Disorder Labs: No results found for: HGBA1C, MPG No results found for: PROLACTIN No results found for: CHOL, TRIG, HDL, CHOLHDL, VLDL, LDLCALC Lab Results  Component Value Date   TSH 2.61 08/15/2013    Therapeutic Level Labs: No results found for: LITHIUM No results found for: VALPROATE No components found for:  CBMZ  Current Medications: Current Outpatient Medications  Medication Sig Dispense Refill  . ARIPiprazole (ABILIFY) 5 MG tablet Take 1 tablet (5 mg total) by mouth daily. 90 tablet 0  . traZODone (DESYREL) 100 MG tablet Take 1.5 tablets (150 mg total) by mouth at bedtime. 45 tablet 2  . triamterene-hydrochlorothiazide (DYAZIDE) 37.5-25 MG capsule Take 1 capsule by mouth daily.  5  . FLUoxetine (PROZAC) 20 MG tablet Take 1 tablet (20 mg total) by mouth daily. 30 tablet 2  . pantoprazole (PROTONIX) 20 MG tablet Take 1 tablet (20 mg total) by mouth daily. (Patient not taking: Reported on 01/28/2017) 30 tablet 1   No current facility-administered medications for this visit.      Musculoskeletal: Strength & Muscle Tone: within normal limits Gait  & Station: normal Patient leans: N/A  Psychiatric Specialty Exam: Review of Systems  Psychiatric/Behavioral: Positive for depression. The patient has insomnia.   All other systems reviewed and are negative.   Blood pressure 113/80, pulse 89, temperature 98.3 F (36.8 C), temperature source Oral, weight 190 lb 12.8 oz (86.5 kg), unknown if currently breastfeeding.Body mass index is 34.9 kg/m.  General Appearance: Casual  Eye Contact:  Fair  Speech:  Normal Rate  Volume:  Normal  Mood:  Depressed and Dysphoric  Affect:  Congruent  Thought Process:  Goal Directed and Descriptions of Associations: Intact  Orientation:  Full (Time, Place, and Person)  Thought Content: Logical   Suicidal Thoughts:  No  Homicidal Thoughts:  No  Memory:  Immediate;   Fair Recent;   Fair Remote;  Fair  Judgement:  Fair  Insight:  Fair  Psychomotor Activity:  Normal  Concentration:  Concentration: Fair and Attention Span: Fair  Recall:  [employer] of Knowledge: Fair  Language: Fair  Akathisia:  No  Handed:  Right  AIMS (if indicated): 0  Assets:  Communication Skills Social Support  ADL's:  Intact  Cognition: WNL  Sleep:  Poor   Screenings:   Assessment and Plan: Jewelia is a 26 year old Caucasian female who has a history of depression, anxiety, presented to the clinic  for a follow-up visit.  Sojourner recently had a suicide attempt as well as IP mental health admission at old [facility].  She is currently improving on the current medication dosage.  She continues to struggle with some depressive symptoms as well as sleep issues.  She has started psychotherapy with Ms. Peacock here in clinic.  We will continue to make medication changes as noted below.    Plan Increase Prozac to 20 mg p.o. daily Continue Abilify 5 mg p.o. daily Continue CBT  For anxiety  Increase Prozac to 20 mg p.o. daily Continue CBT with Ms. Peacock  For insomnia Increase trazodone to 150 mg p.o.  nightly.  Requested medical records from inpatient hospital admission.  Pending.  More than 50 % of the time was spent for psychoeducation and supportive psychotherapy and care coordination.  This note was generated in part or whole with voice recognition software. Voice recognition is usually quite accurate but there are transcription errors that can and very often do occur. I apologize for any typographical errors that were not detected and corrected.       Jomarie Longs, MD 03/02/2017, 12:30 PM
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
## 2017-03-08 ENCOUNTER — Telehealth: Payer: Self-pay

## 2017-03-08 ENCOUNTER — Other Ambulatory Visit: Payer: Self-pay | Admitting: Psychiatry

## 2017-03-08 DIAGNOSIS — G2111 Neuroleptic induced parkinsonism: Secondary | ICD-10-CM

## 2017-03-08 MED ORDER — BENZTROPINE MESYLATE 0.5 MG PO TABS: 0.5000 mg | ORAL_TABLET | Freq: Every day | ORAL | 1 refills | Status: DC

## 2017-03-08 NOTE — Telephone Encounter (Signed)
Please let patient know I have sent a new medication called cogentin 0.5 mg po daily to her pharmacy - [facility] for side effects of abilify. She can start taking this new medication with a reduced dosage of abilify 2.5 mg - she should cut her abilify 5 mg in to half . If side effects persist she can stop taking it and come back and see me sooner in clinic.

## 2017-03-08 NOTE — Telephone Encounter (Signed)
pt called states she feels like she is having a side affect of the abilify. she states she is having some faceal stiffness and she wasnted to find out if she can taper off medications.

## 2017-03-08 NOTE — Telephone Encounter (Signed)
Patient with drug-induced dystonia- will send Cogentin to her pharmacy.  She will also reduce the dose of Abilify to 2.5 mg.  If it does not go away she will stop the Abilify.  She will return to clinic to see me sooner if she wants to.

## 2017-03-09 NOTE — Telephone Encounter (Signed)
Pt was called and given information . Pt states she will try for a week and see how it does and let us know.

## 2017-03-21 ENCOUNTER — Ambulatory Visit: Payer: BLUE CROSS/BLUE SHIELD | Admitting: Psychiatry

## 2017-03-21 ENCOUNTER — Ambulatory Visit: Payer: BC Managed Care – PPO | Admitting: Licensed Clinical Social Worker

## 2017-03-21 DIAGNOSIS — F331 Major depressive disorder, recurrent, moderate: Secondary | ICD-10-CM | POA: Diagnosis not present

## 2017-03-31 ENCOUNTER — Ambulatory Visit: Payer: BC Managed Care – PPO | Admitting: Psychiatry

## 2017-03-31 ENCOUNTER — Other Ambulatory Visit: Payer: Self-pay

## 2017-03-31 ENCOUNTER — Encounter: Payer: Self-pay | Admitting: Psychiatry

## 2017-03-31 VITALS — BP 115/79 | HR 77 | Temp 98.8°F | Wt 192.6 lb

## 2017-03-31 DIAGNOSIS — F331 Major depressive disorder, recurrent, moderate: Secondary | ICD-10-CM

## 2017-03-31 NOTE — Patient Instructions (Addendum)
   Patient has a primary psychiatric diagnosis of MDD( Major depressive disorder, recurrent , moderate)  Patient's with depression can have problems with day to today functioning , attention, concentration , sleep issues and so on which can affect her ability to perform at work place. Sonya [name] is currently on new medications which needs time to build up and can also cause side effects during the initial period or with any dosage changes . Usually the medications she is on needs 8 to 12 weeks to have its full effect . Her medications were changed in the past 4 weeks and she is currently in the process of getting adjusted to them.  If you need further information , please request medical records .

## 2017-03-31 NOTE — Progress Notes (Signed)
BH MD OP Progress Note  03/31/2017 11:24 AM Sonya [name]  MRN:  [id number]  Chief Complaint: ' I am ok."  Chief Complaint    Follow-up; Medication Refill     HPI: Sonya [name] is a 27 year old Caucasian female who is currently unemployed, married, lives in [place], has a history of depression, anxiety, presented to the clinic today for a follow-up visit.   Sonya [name] reports that she is currently doing a little bit better than before.  She reports the Prozac has actually started helping her.  She reports she developed some side effects to the Abilify.  She tried the Cogentin along with the Abilify while she tapered herself off of the Abilify.  She reports even with the Cogentin she continued to have some stiffness and EPS-like symptoms.  She reports she hence discontinued the Abilify.  She reports the Prozac at this dose is effective.  She reports she wants to stay on this dose for now.  She reports her sleep was better on the trazodone.  She reports her sleep is more restful.  She reports she also feels better because she had a job interview with [employer].  She reports she is awaiting a call from them.  She is hoping that she will get this job.  She continues to be able to take care of her daughter who is 2 years old.  She reports that her relationship with her husband has gotten better.  She currently denies any suicidality or perceptual disturbances.  She denies any side effects to the current medications.  She is compliant on the medication.  Her primary medical doctor is currently working with her to help apply for disability.  She requested some information from Clinical research associate which was provided to her.  Printed out information that she needed in her after visit summary.  Visit Diagnosis:    ICD-10-CM   1. MDD (major depressive disorder), recurrent episode, moderate (HCC) F33.1     Past Psychiatric History: History of depression and anxiety.  She was being treated by her PMD in the past.   She reports 1 admission at [facility].  She was discharged 1.5 months ago .  She reports a history of self-injurious behaviors of cutting in the past.  She reports she has outgrown it now.  She does report a history of overdose/suicide attempt in the past when she tried to overdose on her hydrochlorothiazide pills.  She reports  it as an impulsive act since she had a conversation with her husband about divorce the previous night.  Past trials of medications include Lexapro (did not work), Wellbutrin (to help with smoking cessation), Luvox (made her worse),  Pristiq (panic attacks).  Past Medical History:  Past Medical History:  Diagnosis Date  . Anxiety   . Depression   . Meniere disease UNKNOWN  . Vertigo     Past Surgical History:  Procedure Laterality Date  . WRIST SURGERY      Family Psychiatric History: Father( deceased) - drug abuse, sister-drug abuse.  She denies any history of suicide in her family.  She denies any history of other mental health problems in her family.  Family History:  Family History  Problem Relation Age of Onset  . Graves' disease Mother   . Drug abuse Father   . Drug abuse Sister   . Depression Brother    Substance abuse history: Denies  Social History: She is married.  She has 76-year-old daughter Sonya [name].  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  She is currently unemployed.  Her husband works and supports her financially.  She has applied for short-term disability.  She does have social support from her mother. Social History   Socioeconomic History  . Marital status: Married    Spouse name: calvin  . Number of children: 1  . Years of education: None  . Highest education level: Associate degree: occupational, Scientist, product/process development, or vocational program  Social Needs  . Financial resource strain: Hard  . Food insecurity - worry: Often true  . Food insecurity - inability: Often true  . Transportation needs - medical: No  . Transportation needs  - non-medical: No  Occupational History    Comment: not employed  Tobacco Use  . Smoking status: Former Games developer  . Smokeless tobacco: Never Used  Substance and Sexual Activity  . Alcohol use: No  . Drug use: No  . Sexual activity: Yes  Other Topics Concern  . None  Social History Narrative  . None    Allergies:  Allergies  Allergen Reactions  . Codeine Nausea And Vomiting    Metabolic Disorder Labs: No results found for: HGBA1C, MPG No results found for: PROLACTIN No results found for: CHOL, TRIG, HDL, CHOLHDL, VLDL, LDLCALC Lab Results  Component Value Date   TSH 2.61 08/15/2013    Therapeutic Level Labs: No results found for: LITHIUM No results found for: VALPROATE No components found for:  CBMZ  Current Medications: Current Outpatient Medications  Medication Sig Dispense Refill  . FLUoxetine (PROZAC) 20 MG tablet Take 1 tablet (20 mg total) by mouth daily. 30 tablet 2  . traZODone (DESYREL) 100 MG tablet Take 1.5 tablets (150 mg total) by mouth at bedtime. 45 tablet 2  . triamterene-hydrochlorothiazide (DYAZIDE) 37.5-25 MG capsule Take 1 capsule by mouth daily.  5  . pantoprazole (PROTONIX) 20 MG tablet Take 1 tablet (20 mg total) by mouth daily. (Patient not taking: Reported on 01/28/2017) 30 tablet 1   No current facility-administered medications for this visit.      Musculoskeletal: Strength & Muscle Tone: within normal limits Gait & Station: normal Patient leans: N/A  Psychiatric Specialty Exam: Review of Systems  Psychiatric/Behavioral: Positive for depression (improving).  All other systems reviewed and are negative.   Blood pressure 115/79, pulse 77, temperature 98.8 F (37.1 C), temperature source Oral, weight 192 lb 9.6 oz (87.4 kg), unknown if currently breastfeeding.Body mass index is 35.23 kg/m.  General Appearance: Casual  Eye Contact:  Fair  Speech:  Clear and Coherent  Volume:  Normal  Mood:  Dysphoric  Affect:  Congruent  Thought  Process:  Goal Directed and Descriptions of Associations: Intact  Orientation:  Full (Time, Place, and Person)  Thought Content: Logical   Suicidal Thoughts:  No  Homicidal Thoughts:  No  Memory:  Immediate;   Fair Recent;   Fair Remote;   Fair  Judgement:  Fair  Insight:  Fair  Psychomotor Activity:  Normal  Concentration:  Concentration: Fair and Attention Span: Fair  Recall:  [employer] of Knowledge: Fair  Language: Fair  Akathisia:  No  Handed:  Right  AIMS (if indicated): 0  Assets:  Communication Skills Desire for Improvement Housing Physical Health Social Support Talents/Skills  ADL's:  Intact  Cognition: WNL  Sleep:  improving     Assessment and Plan: Sonya [name] is a 27 year old Caucasian female who has a history of depression, anxiety, presented to the clinic today for a follow-up visit.  Sonya [name] recently had  a suicide attempt as well as [facility].  She is currently improving on the current medication-Prozac as well as trazodone.  She reports good social support from her family.  She denies any suicidality.  We will continue medication changes as noted below.  Plan  For depression Continue Prozac 20 mg p.o. daily Discontinue Abilify for side effects/noncompliance. Continue CBT if patient agrees.  For insomnia Continue trazodone 150 mg p.o. Nightly.  Follow-up in 1 month or sooner if needed.  More than 50 % of the time was spent for psychoeducation and supportive psychotherapy and care coordination.  This note was generated in part or whole with voice recognition software. Voice recognition is usually quite accurate but there are transcription errors that can and very often do occur. I apologize for any typographical errors that were not detected and corrected.         Jomarie LongsSaramma Millard Bautch, MD 03/31/2017, 11:24 AM

## 2017-04-03 NOTE — Progress Notes (Signed)
   THERAPIST PROGRESS NOTE  Session Time: 55mn  Participation Level: Active  Behavioral Response: CasualAlertDepressed  Type of Therapy: Individual Therapy  Treatment Goals addressed: Coping and Diagnosis: Depression  Interventions: CBT, Motivational Interviewing and Solution Focused  Summary: Yvonnia HRabagois a 27y.o. female who presents with continued symptoms of her diagnosis.  Patient reports that she is doing "fairly" well.  She reports that she wants to improve on her mood.  She reports that her family life is not doing that well.  She reports that finances are poor and that her husband wants her to find a paying job.  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  She reports that she wants to reduce her depressive symptoms. Denies having coping skills. Reports that [place] is [place]  Suicidal/Homicidal: No  Therapist Response: Therapist met with Patient in an initial therapy session to assess current mood and to build rapport. Therapist engaged Patient in discussion about her life and what is going well for her. Therapist provided support for Patient as she shared details about her life, her current stressors, mood, coping skills, her past, and her children. Therapist prompted Patient to discuss her support system and ways that she manages her daily stress, anger, and frustrations.  LCSW discussed what psychotherapy is and is not and the importance of the therapeutic relationship to include open and honest communication between client and therapist and building trust.  Reviewed advantages and disadvantages of the therapeutic process and limitations to the therapeutic relationship including LCSW's role in maintaining the safety of the client, others and those in client's care.  Completed treatment plan.   Plan: Return again in 2 weeks.  Diagnosis: Axis I: Major Depression, Recurrent  severe    Axis II: No diagnosis    NLubertha South LCSW 03/21/2017

## 2017-04-05 ENCOUNTER — Telehealth: Payer: Self-pay

## 2017-04-05 DIAGNOSIS — F331 Major depressive disorder, recurrent, moderate: Secondary | ICD-10-CM

## 2017-04-05 MED ORDER — TRAZODONE HCL 100 MG PO TABS: 150.0000 mg | ORAL_TABLET | Freq: Every day | ORAL | 0 refills | Status: DC

## 2017-04-05 MED ORDER — FLUOXETINE HCL 20 MG PO TABS: 20.0000 mg | ORAL_TABLET | Freq: Every day | ORAL | 0 refills | Status: DC

## 2017-04-05 NOTE — Telephone Encounter (Signed)
Sent trazodone and prozac to her pharmacy for 90 days.

## 2017-04-05 NOTE — Telephone Encounter (Signed)
received a fax requesting a 90 day supply of the trazodone 100mg  and fluoxetine 20mg    Disp Refills Start End   traZODone (DESYREL) 100 MG tablet 45 tablet 2 03/02/2017    Sig - Route: Take 1.5 tablets (150 mg total) by mouth at bedtime. - Oral   Sent to pharmacy as: traZODone (DESYREL) 100 MG tablet   E-Prescribing Status: Receipt confirmed by pharmacy (03/02/2017 12:15 PM EST)     Disp Refills Start End   FLUoxetine (PROZAC) 20 MG tablet 30 tablet 2 03/02/2017    Sig - Route: Take 1 tablet (20 mg total) by mouth daily. - Oral   Sent to pharmacy as: FLUoxetine (PROZAC) 20 MG tablet   E-Prescribing Status: Receipt confirmed by pharmacy (03/02/2017 12:15 PM EST)    Pt was last seen on  03-31-17 next appt  04-28-17

## 2017-04-19 ENCOUNTER — Ambulatory Visit: Payer: BC Managed Care – PPO | Admitting: Licensed Clinical Social Worker

## 2017-04-19 DIAGNOSIS — F411 Generalized anxiety disorder: Secondary | ICD-10-CM | POA: Diagnosis not present

## 2017-04-19 DIAGNOSIS — F331 Major depressive disorder, recurrent, moderate: Secondary | ICD-10-CM

## 2017-04-20 ENCOUNTER — Ambulatory Visit: Payer: BC Managed Care – PPO | Admitting: Licensed Clinical Social Worker

## 2017-04-28 ENCOUNTER — Ambulatory Visit: Payer: BC Managed Care – PPO | Admitting: Psychiatry

## 2017-05-04 ENCOUNTER — Ambulatory Visit: Payer: BC Managed Care – PPO | Admitting: Licensed Clinical Social Worker

## 2017-05-27 ENCOUNTER — Encounter: Payer: Self-pay | Admitting: Emergency Medicine

## 2017-05-27 ENCOUNTER — Other Ambulatory Visit: Payer: Self-pay

## 2017-05-27 DIAGNOSIS — A0811 Acute gastroenteropathy due to Norwalk agent: Secondary | ICD-10-CM | POA: Insufficient documentation

## 2017-05-27 DIAGNOSIS — Z79899 Other long term (current) drug therapy: Secondary | ICD-10-CM | POA: Insufficient documentation

## 2017-05-27 DIAGNOSIS — Z87891 Personal history of nicotine dependence: Secondary | ICD-10-CM | POA: Diagnosis not present

## 2017-05-27 DIAGNOSIS — R112 Nausea with vomiting, unspecified: Secondary | ICD-10-CM | POA: Diagnosis present

## 2017-05-27 DIAGNOSIS — A040 Enteropathogenic Escherichia coli infection: Secondary | ICD-10-CM | POA: Diagnosis not present

## 2017-05-27 LAB — CBC
HCT: 39.1 % (ref 35.0–47.0)
Hemoglobin: 13.8 g/dL (ref 12.0–16.0)
MCH: 29.1 pg (ref 26.0–34.0)
MCHC: 35.3 g/dL (ref 32.0–36.0)
MCV: 82.6 fL (ref 80.0–100.0)
PLATELETS: 265 10*3/uL (ref 150–440)
RBC: 4.73 MIL/uL (ref 3.80–5.20)
RDW: 13.6 % (ref 11.5–14.5)
WBC: 14 10*3/uL — AB (ref 3.6–11.0)

## 2017-05-27 LAB — URINALYSIS, COMPLETE (UACMP) WITH MICROSCOPIC
Bacteria, UA: NONE SEEN
Bilirubin Urine: NEGATIVE
GLUCOSE, UA: NEGATIVE mg/dL
Ketones, ur: 80 mg/dL — AB
Leukocytes, UA: NEGATIVE
Nitrite: NEGATIVE
PH: 5 (ref 5.0–8.0)
Protein, ur: 30 mg/dL — AB
SQUAMOUS EPITHELIAL / LPF: NONE SEEN
Specific Gravity, Urine: 1.033 — ABNORMAL HIGH (ref 1.005–1.030)

## 2017-05-27 LAB — COMPREHENSIVE METABOLIC PANEL
ALK PHOS: 57 U/L (ref 38–126)
ALT: 24 U/L (ref 14–54)
AST: 23 U/L (ref 15–41)
Albumin: 4.3 g/dL (ref 3.5–5.0)
Anion gap: 9 (ref 5–15)
BUN: 17 mg/dL (ref 6–20)
CHLORIDE: 108 mmol/L (ref 101–111)
CO2: 21 mmol/L — ABNORMAL LOW (ref 22–32)
CREATININE: 0.64 mg/dL (ref 0.44–1.00)
Calcium: 9.1 mg/dL (ref 8.9–10.3)
GFR calc Af Amer: >60 mL/min (ref >60)
Glucose, Bld: 94 mg/dL (ref 65–99)
Potassium: 3.8 mmol/L (ref 3.5–5.1)
Sodium: 138 mmol/L (ref 135–145)
Total Bilirubin: 0.8 mg/dL (ref 0.3–1.2)
Total Protein: 8.2 g/dL — ABNORMAL HIGH (ref 6.5–8.1)

## 2017-05-27 LAB — LIPASE, BLOOD: LIPASE: 27 U/L (ref 11–51)

## 2017-05-27 LAB — POCT PREGNANCY, URINE: Preg Test, Ur: NEGATIVE

## 2017-05-27 MED ORDER — ONDANSETRON 4 MG PO TBDP
ORAL_TABLET | ORAL | Status: AC
  Administered 2017-05-27: 4 mg via ORAL
  Filled 2017-05-27: qty 1

## 2017-05-27 MED ORDER — ONDANSETRON 4 MG PO TBDP
4.0000 mg | ORAL_TABLET | Freq: Once | ORAL | Status: AC
  Administered 2017-05-27: 4 mg via ORAL

## 2017-05-27 NOTE — ED Triage Notes (Signed)
Pt reports around 2pm she developed N/V/D reports had breakfast about 12pm pt denies tring any new foods. Pt reports epigastric pain. Pt talks in complete sentences.

## 2017-05-27 NOTE — ED Notes (Signed)
Patient given a warm blanket. 

## 2017-05-27 NOTE — ED Notes (Signed)
Pt reports she has Zofran ODT at home 4mg  reports she took one about an hour ago

## 2017-05-27 NOTE — ED Notes (Signed)
Patient to stat desk asking about wait time. Patient given update on wait time. Patient verbalizes understanding. Patient in no acute distress at this time.

## 2017-05-28 ENCOUNTER — Emergency Department
Admission: EM | Admit: 2017-05-28 | Discharge: 2017-05-28 | Disposition: A | Discharge status: Home / Self Care | Payer: BC Managed Care – PPO | Attending: Emergency Medicine | Admitting: Emergency Medicine

## 2017-05-28 DIAGNOSIS — R197 Diarrhea, unspecified: Secondary | ICD-10-CM

## 2017-05-28 DIAGNOSIS — A0811 Acute gastroenteropathy due to Norwalk agent: Secondary | ICD-10-CM

## 2017-05-28 DIAGNOSIS — R112 Nausea with vomiting, unspecified: Secondary | ICD-10-CM

## 2017-05-28 DIAGNOSIS — A040 Enteropathogenic Escherichia coli infection: Secondary | ICD-10-CM

## 2017-05-28 LAB — C DIFFICILE QUICK SCREEN W PCR REFLEX
C DIFFICILE (CDIFF) TOXIN: NEGATIVE
C Diff antigen: NEGATIVE
C Diff interpretation: NOT DETECTED

## 2017-05-28 LAB — GASTROINTESTINAL PANEL BY PCR, STOOL (REPLACES STOOL CULTURE)
Adenovirus F40/41: NOT DETECTED
Astrovirus: NOT DETECTED
CAMPYLOBACTER SPECIES: NOT DETECTED
Cryptosporidium: NOT DETECTED
Cyclospora cayetanensis: NOT DETECTED
ENTEROAGGREGATIVE E COLI (EAEC): NOT DETECTED
Entamoeba histolytica: NOT DETECTED
Enteropathogenic E coli (EPEC): DETECTED — AB
Enterotoxigenic E coli (ETEC): NOT DETECTED
Giardia lamblia: NOT DETECTED
NOROVIRUS GI/GII: DETECTED — AB
PLESIMONAS SHIGELLOIDES: NOT DETECTED
ROTAVIRUS A: NOT DETECTED
SALMONELLA SPECIES: NOT DETECTED
SAPOVIRUS (I, II, IV, AND V): NOT DETECTED
SHIGA LIKE TOXIN PRODUCING E COLI (STEC): NOT DETECTED
SHIGELLA/ENTEROINVASIVE E COLI (EIEC): NOT DETECTED
Vibrio cholerae: NOT DETECTED
Vibrio species: NOT DETECTED
Yersinia enterocolitica: NOT DETECTED

## 2017-05-28 MED ORDER — CIPROFLOXACIN HCL 500 MG PO TABS
500.0000 mg | ORAL_TABLET | Freq: Once | ORAL | Status: AC
  Administered 2017-05-28: 500 mg via ORAL
  Filled 2017-05-28: qty 1

## 2017-05-28 MED ORDER — CIPROFLOXACIN HCL 500 MG PO TABS: 500.0000 mg | ORAL_TABLET | Freq: Two times a day (BID) | ORAL | 0 refills | Status: DC

## 2017-05-28 MED ORDER — DEXTROSE-NACL 5-0.45 % IV SOLN
INTRAVENOUS | Status: DC
  Administered 2017-05-28: 1000 mL via INTRAVENOUS

## 2017-05-28 MED ORDER — ONDANSETRON HCL 4 MG/2ML IJ SOLN
4.0000 mg | Freq: Once | INTRAMUSCULAR | Status: AC
  Administered 2017-05-28: 4 mg via INTRAVENOUS
  Filled 2017-05-28: qty 2

## 2017-05-28 MED ORDER — ONDANSETRON 4 MG PO TBDP: 4.0000 mg | ORAL_TABLET | Freq: Three times a day (TID) | ORAL | 0 refills | Status: DC | PRN

## 2017-05-28 NOTE — ED Notes (Signed)
Patient to stat desk asking about wait time. Patient given update on wait time. Patient verbalizes understanding. Patient in no acute distress at this time.  

## 2017-05-28 NOTE — ED Provider Notes (Signed)
[facility] Emergency Department Provider Note   ____________________________________________   None    (approximate)  I have reviewed the triage vital signs and the nursing notes.   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  Symptoms started approximately 2 PM yesterday.  Describes similar number of episodes for both vomiting and diarrhea.  Describes epigastric cramping which is now improved.  Denies associated fever, chills, chest pain, shortness of breath, dysuria.  Denies recent travel, trauma or antibiotic use.   Past Medical History:  Diagnosis Date  . Anxiety   . Depression   . Meniere disease UNKNOWN  . Vertigo     Patient Active Problem List   Diagnosis Date Noted  . Indication for care in labor and delivery, delivered 12/26/2014  . Indication for care or intervention related to labor and delivery 12/23/2014  . Labor and delivery, indication for care 10/13/2014    Past Surgical History:  Procedure Laterality Date  . WRIST SURGERY      Prior to Admission medications   Medication Sig Start Date End Date Taking? Authorizing Provider  FLUoxetine (PROZAC) 20 MG tablet Take 1 tablet (20 mg total) by mouth daily. 04/05/17   Jomarie LongsEappen, Saramma, MD  pantoprazole (PROTONIX) 20 MG tablet Take 1 tablet (20 mg total) by mouth daily. Patient not taking: Reported on 01/28/2017 07/11/15 07/10/16  Jennye MoccasinQuigley, Brian S, MD  traZODone (DESYREL) 100 MG tablet Take 1.5 tablets (150 mg total) by mouth at bedtime. 04/05/17   Jomarie LongsEappen, Saramma, MD  triamterene-hydrochlorothiazide (DYAZIDE) 37.5-25 MG capsule Take 1 capsule by mouth daily. 01/18/17   [provider]    Allergies Codeine  Family History  Problem Relation Age of Onset  . Graves' disease Mother   . Drug abuse  Father   . Drug abuse Sister   . Depression Brother     Social History Social History   Tobacco Use  . Smoking status: Former Games developermoker  . Smokeless tobacco: Never Used  Substance Use Topics  . Alcohol use: No  . Drug use: No    Review of Systems  Constitutional: No fever/chills. Eyes: No visual changes. ENT: No sore throat. Cardiovascular: Denies chest pain. Respiratory: Denies shortness of breath. Gastrointestinal: Positive for abdominal pain, nausea, vomiting and diarrhea.  No constipation. Genitourinary: Negative for dysuria. Musculoskeletal: Negative for back pain. Skin: Negative for rash. Neurological: Negative for headaches, focal weakness or numbness.   ____________________________________________   PHYSICAL EXAM:  VITAL SIGNS: ED Triage Vitals  Enc Vitals Group     BP 05/27/17 2010 106/86     Pulse Rate 05/27/17 2010 91     Resp 05/27/17 2010 20     Temp 05/27/17 2010 99 F (37.2 C)     Temp Source 05/27/17 2010 Oral     SpO2 05/27/17 2010 96 %     Weight 05/27/17 2013 200 lb (90.7 kg)     Height 05/27/17 2013 5\' 2"  (1.575 m)     Head Circumference --      Peak Flow --      Pain Score 05/27/17 2012 9     Pain Loc --      Pain Edu? --      Excl. in GC? --     Constitutional: Alert and oriented. Well appearing and  in no acute distress. Eyes: Conjunctivae are normal. PERRL. EOMI. Head: Atraumatic. Nose: No congestion/rhinnorhea. Mouth/Throat: Mucous membranes are moist.  Oropharynx non-erythematous. Neck: No stridor.   Cardiovascular: Normal rate, regular rhythm. Grossly normal heart sounds.  Good peripheral circulation. Respiratory: Normal respiratory effort.  No retractions. Lungs CTAB. Gastrointestinal: Soft and nontender to light or deep palpation. No distention. No abdominal bruits. No CVA tenderness. Musculoskeletal: No lower extremity tenderness nor edema.  No joint effusions. Neurologic:  Normal speech and language. No gross focal neurologic  deficits are appreciated. No gait instability. Skin:  Skin is warm, dry and intact. No rash noted. Psychiatric: Mood and affect are normal. Speech and behavior are normal.  ____________________________________________   LABS (all labs ordered are listed, but only abnormal results are displayed)  Labs Reviewed  GASTROINTESTINAL PANEL BY PCR, STOOL (REPLACES STOOL CULTURE) - Abnormal; Notable for the following components:      Result Value   Enteropathogenic E coli (EPEC) DETECTED (*)    Norovirus GI/GII DETECTED (*)    All other components within normal limits  COMPREHENSIVE METABOLIC PANEL - Abnormal; Notable for the following components:   CO2 21 (*)    Total Protein 8.2 (*)    All other components within normal limits  CBC - Abnormal; Notable for the following components:   WBC 14.0 (*)    All other components within normal limits  URINALYSIS, COMPLETE (UACMP) WITH MICROSCOPIC - Abnormal; Notable for the following components:   Color, Urine YELLOW (*)    APPearance HAZY (*)    Specific Gravity, Urine 1.033 (*)    Hgb urine dipstick LARGE (*)    Ketones, ur 80 (*)    Protein, ur 30 (*)    All other components within normal limits  C DIFFICILE QUICK SCREEN W PCR REFLEX  LIPASE, BLOOD  POC URINE PREG, ED  POCT PREGNANCY, URINE   ____________________________________________  EKG  None ____________________________________________  RADIOLOGY  ED MD interpretation: None  Official radiology report(s): No results found.  ____________________________________________   PROCEDURES  Procedure(s) performed: None  Procedures  Critical Care performed: No  ____________________________________________   INITIAL IMPRESSION / ASSESSMENT AND PLAN / ED COURSE  As part of my medical decision making, I reviewed the following data within the electronic MEDICAL RECORD NUMBER Nursing notes reviewed and incorporated, Labs reviewed, Old chart reviewed and Notes from prior ED  visits   27 year old female who presents with abdominal pain, nausea, vomiting and diarrhea. Differential diagnosis includes, but is not limited to, biliary disease (biliary colic, acute cholecystitis, cholangitis, choledocholithiasis, etc), intrathoracic causes for epigastric abdominal pain including ACS, gastritis, duodenitis, pancreatitis, small bowel or large bowel obstruction, abdominal aortic aneurysm, hernia, and gastritis.  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  Laboratory results remarkable for mild leukocytosis and ketones in urine.  Will administer D5 half-normal saline, IV Zofran.  Patient has produced stool specimen and we are awaiting results.  Nausea improved and patient is asking for ice chips.  Clinical Course as of May 28 817  Sun May 28, 2017  0818 Patient opted to wait for bio fire results which have come back positive for EPEC and Arna Medici virus.  Will place her on a short course of Cipro twice daily.  Will discharge home on Zofran as needed, Cipro and patient will follow up with her PCP next week.  Strict return precautions given.  Patient verbalizes understanding and agrees with plan of care.   [JS]  Clinical Course User Index [JS] Irean Hong, MD     ____________________________________________   FINAL CLINICAL IMPRESSION(S) / ED DIAGNOSES  Final diagnoses:  Nausea vomiting and diarrhea  Norovirus  Intestinal infection due to enteropathogenic E. coli     ED Discharge Orders    None       Note:  This document was prepared using Dragon voice recognition software and may include unintentional dictation errors.    Irean Hong, MD 05/28/17 [id number]

## 2017-05-28 NOTE — ED Notes (Signed)
Dr. Dolores FrameSung in room to update patient.  Will continue to monitor.

## 2017-05-28 NOTE — Discharge Instructions (Addendum)
1.  Take Cipro 500 mg twice daily for 5 days. 2.  You may take Zofran as needed for nausea. 3.  Clear liquids for 12 hours, then BRAT diet for 3 days, then slowly advance diet as tolerated. 4.  Return to the ER for worsening symptoms, persistent vomiting, difficulty breathing or other concerns.

## 2017-05-28 NOTE — ED Notes (Signed)
Patient reports that when she was vomiting in triage she stood up and noticed blood on her chair. Patient believes that it may have been menstrual blood, however, patient is on depo, and has not had a period in over a year. Patient further reports that when she clean herself blood was present in both the vaginal and rectal areas, so patient is unsure of the origin of the blood.

## 2017-05-29 NOTE — Progress Notes (Signed)
  THERAPIST PROGRESS NOTE   Date of Service:   04/19/2017  Session Time:   1 hour  Patient:   Sonya [name]   DOB:   10/19/1990  MR Number:  [id number]  [facility]  [facility] [address] [place] [address] Dept: [phone]            Provider/Observer:  Marinda ElkNicole M Peacock Counselor  Risk of Suicide/Violence: low   Diagnosis:    MDD (major depressive disorder), recurrent episode, moderate (HCC)  GAD (generalized anxiety disorder)  Type of Therapy: Individual Therapy  Treatment Goals addressed: Diagnosis: Depression  Participation Level: Active   Interventions: CBT and Motivational Interviewing   Behavioral Response: CasualAlertDepressed   Summary: Patient reports a favorable mood.  She reports that she continues to struggle with her mood when she thinks about going back to work.  She reports that she does not want to go back to her job due to her relationship with her supervisor.  She reports tension due to number of absences.  She reports that she has been searching for another job but has not had a good work history.  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  Patient was able to list her current stressors and how she is able to cope with each.  She reports that she and her husband are not "seeing eye to eye" on a lot of "big topics."  Stressed the importance of mood stabilization through learned coping skills and medication management.  Encouraged Patient to focus on her strengths and the positive aspects.    Plan: Sonya [name] will continue to use coping skills and take medication prescribed by her Psychiatrist.     Return again in 2 weeks.

## 2017-06-12 ENCOUNTER — Telehealth: Payer: Self-pay

## 2017-06-12 DIAGNOSIS — F331 Major depressive disorder, recurrent, moderate: Secondary | ICD-10-CM

## 2017-06-12 MED ORDER — FLUOXETINE HCL 20 MG PO TABS: 20.0000 mg | ORAL_TABLET | Freq: Every day | ORAL | 0 refills | Status: DC

## 2017-06-12 NOTE — Telephone Encounter (Signed)
Sent 30 days supply to pharmacy. Pt will not be given further refills without appointment.

## 2017-06-12 NOTE — Telephone Encounter (Signed)
  received a request for refill pt was last seen on  03-31-17 and next appt 06-21-17. pt had a no show appt  on 04-28-17   FLUoxetine (PROZAC) 20 MG tablet  Medication  Date: 04/05/2017 Department: [facility] Ordering/Authorizing: Jomarie LongsEappen, Saramma, MD  Order Providers   Prescribing Provider Encounter Provider  Jomarie LongsEappen, Saramma, MD Elvina MattesNorton, Draper Gallon L, CMA  Outpatient Medication Detail    Disp Refills Start End   FLUoxetine (PROZAC) 20 MG tablet 90 tablet 0 04/05/2017    Sig - Route: Take 1 tablet (20 mg total) by mouth daily. - Oral   Sent to pharmacy as: FLUoxetine (PROZAC) 20 MG tablet   E-Prescribing Status: Receipt confirmed by pharmacy (04/05/2017 1:58 PM EST)

## 2017-06-21 ENCOUNTER — Ambulatory Visit: Payer: BC Managed Care – PPO | Admitting: Licensed Clinical Social Worker

## 2017-06-21 ENCOUNTER — Other Ambulatory Visit: Payer: Self-pay

## 2017-06-21 ENCOUNTER — Ambulatory Visit (INDEPENDENT_AMBULATORY_CARE_PROVIDER_SITE_OTHER): Payer: BC Managed Care – PPO | Admitting: Psychiatry

## 2017-06-21 ENCOUNTER — Encounter: Payer: Self-pay | Admitting: Psychiatry

## 2017-06-21 VITALS — BP 99/64 | HR 70 | Temp 98.0°F | Wt 191.6 lb

## 2017-06-21 DIAGNOSIS — F331 Major depressive disorder, recurrent, moderate: Secondary | ICD-10-CM | POA: Diagnosis not present

## 2017-06-21 DIAGNOSIS — F419 Anxiety disorder, unspecified: Secondary | ICD-10-CM | POA: Diagnosis not present

## 2017-06-21 MED ORDER — TRAZODONE HCL 100 MG PO TABS: 100.0000 mg | ORAL_TABLET | Freq: Every day | ORAL | 3 refills | Status: DC

## 2017-06-21 MED ORDER — FLUOXETINE HCL 20 MG PO CAPS: 20.0000 mg | ORAL_CAPSULE | Freq: Every day | ORAL | 3 refills | Status: DC

## 2017-06-21 NOTE — Progress Notes (Signed)
[facility] MD OP Progress Note  06/21/2017 1:02 PM Sonya [name]  MRN:  [id number]  Chief Complaint: ' I am here for follow up." Chief Complaint    Follow-up; Medication Refill     HPI: Sonya [name] is a 27 year old Caucasian female who is currently employed, married, lives in [place], has a history of depression, anxiety, presented to the clinic today for a follow-up visit.  Sonya [name] today reports she was able to get a job at [employer].  She reports her schedule is working well.  She continues to have support system from her mother who helps take care of her baby while she is away at work.  Sonya [name] reports she continues to be compliant with her Prozac.  She reports Prozac 20 mg is helpful.  She denies any significant sadness, hopelessness, crying spells.  She denies any suicidality.  She continues to report anxiety symptoms as under control.  She reports sleep as good on the trazodone.  She reports she takes only 100 mg at bedtime.  She continues to have some relationship issues with her husband.  She however reports she is not distressed by it.  She is in psychotherapy with Ms. Peacock here in clinic.  She does report having financial issues and inability to see Ms. Peacock frequently.  She however reports she would like to see her on the same day that she sees Clinical research associate.   Visit Diagnosis:    ICD-10-CM   1. MDD (major depressive disorder), recurrent episode, moderate (HCC) F33.1 FLUoxetine (PROZAC) 20 MG capsule    traZODone (DESYREL) 100 MG tablet   early remission  2. Anxiety disorder, unspecified type F41.9     Past Psychiatric History: History of depression and anxiety.  She was being treated by her PMD in the past.  She reports one admission at [facility].  She was discharged few months ago.  She reports a history of self-injurious behaviors of cutting in the past.  She reports she has outgrown it now.  She does report a history of overdose/suicide attempt in the past when she  tried to overdose on her hydrochlorothiazide pill.  She reports it as an impulsive act since she had a conversation with her husband about the worst the previous night.  Past trials of medications include Lexapro (did not work), Wellbutrin (to help with smoking cessation), Luvox( made her worse), Pristiq( panic attacks).  Past Medical History:  Past Medical History:  Diagnosis Date  . Anxiety   . Depression   . Meniere disease UNKNOWN  . Vertigo     Past Surgical History:  Procedure Laterality Date  . WRIST SURGERY      Family Psychiatric History: Reviewed family psychiatric history from my progress note on 03/31/2017.  Family History:  Family History  Problem Relation Age of Onset  . Graves' disease Mother   . Drug abuse Father   . Drug abuse Sister   . Depression Brother    Substance abuse history: Denies  Social History: She is married.  She has a 81 and half-year-old daughter Sonya [name].  She used to work as a Associate Professor at [employer].  She recently got a job at [employer].  She does have good social support from her mother.   Social History   Socioeconomic History  . Marital status: Married    Spouse name: calvin  . Number of children: 1  . Years of education: Not on file  . Highest education level: Associate degree: occupational, technical,  or vocational program  Occupational History    Comment: not employed  Social Needs  . Financial resource strain: Hard  . Food insecurity:    Worry: Often true    Inability: Often true  . Transportation needs:    Medical: No    Non-medical: No  Tobacco Use  . Smoking status: Former Games developer  . Smokeless tobacco: Never Used  Substance and Sexual Activity  . Alcohol use: No  . Drug use: No  . Sexual activity: Yes  Lifestyle  . Physical activity:    Days per week: 0 days    Minutes per session: 0 min  . Stress: Not on file  Relationships  . Social connections:    Talks on phone: More than three times a week    Gets  together: More than three times a week    Attends religious service: Never    Active member of club or organization: No    Attends meetings of clubs or organizations: Never    Relationship status: Married  Other Topics Concern  . Not on file  Social History Narrative  . Not on file    Allergies:  Allergies  Allergen Reactions  . Codeine Nausea And Vomiting    Metabolic Disorder Labs: No results found for: HGBA1C, MPG No results found for: PROLACTIN No results found for: CHOL, TRIG, HDL, CHOLHDL, VLDL, LDLCALC Lab Results  Component Value Date   TSH 2.61 08/15/2013    Therapeutic Level Labs: No results found for: LITHIUM No results found for: VALPROATE No components found for:  CBMZ  Current Medications: Current Outpatient Medications  Medication Sig Dispense Refill  . ondansetron (ZOFRAN ODT) 4 MG disintegrating tablet Take 1 tablet (4 mg total) by mouth every 8 (eight) hours as needed for nausea or vomiting. 20 tablet 0  . pantoprazole (PROTONIX) 40 MG tablet Take by mouth.    . traZODone (DESYREL) 100 MG tablet Take 1 tablet (100 mg total) by mouth at bedtime. 30 tablet 3  . triamterene-hydrochlorothiazide (DYAZIDE) 37.5-25 MG capsule Take 1 capsule by mouth daily.  5  . FLUoxetine (PROZAC) 20 MG capsule Take 1 capsule (20 mg total) by mouth daily. 30 capsule 3   No current facility-administered medications for this visit.      Musculoskeletal: Strength & Muscle Tone: within normal limits Gait & Station: normal Patient leans: N/A  Psychiatric Specialty Exam: Review of Systems  Psychiatric/Behavioral: Positive for depression (improved). The patient is nervous/anxious (improved).   All other systems reviewed and are negative.   Blood pressure 99/64, pulse 70, temperature 98 F (36.7 C), temperature source Oral, weight 191 lb 9.6 oz (86.9 kg).Body mass index is 35.04 kg/m.  General Appearance: Casual  Eye Contact:  Fair  Speech:  Normal Rate  Volume:  Normal   Mood:  Dysphoricimproving  Affect:  Congruent  Thought Process:  Goal Directed and Descriptions of Associations: Intact  Orientation:  Full (Time, Place, and Person)  Thought Content: Logical   Suicidal Thoughts:  No  Homicidal Thoughts:  No  Memory:  Immediate;   Fair Recent;   Fair Remote;   Fair  Judgement:  Fair  Insight:  Fair  Psychomotor Activity:  Normal  Concentration:  Concentration: Fair and Attention Span: Fair  Recall:  [employer] of Knowledge: Fair  Language: Fair  Akathisia:  No  Handed:  Right  AIMS (if indicated): na  Assets:  Communication Skills Desire for Improvement Housing Intimacy Physical Health Social Support Energy manager  ADL's:  Intact  Cognition: WNL  Sleep:  Fair   Screenings: PHQ 9,GAD 7   Assessment and Plan: Sonya [name] is a 27 year old Caucasian female who has a history of depression, anxiety, presented to the clinic today for a follow-up visit.  Sonya [name] had missed a few of her appointments.  She reports she could not make it due to financial issues.  Fathima has been compliant with her Prozac.  She reports her symptoms as improved.  She continues to have social support from her mother.  Plan as noted below.  Plan  Depression Continue Prozac 20 mg p.o. daily. Continue CBT with Ms. Peacock PHQ 9 = 2  For insomnia Continue trazodone 100 mg p.o. Nightly  For anxiety disorder unspecified Continue Prozac as prescribed GAD 7 equals 4  Continue CBT with Ms. Peacock.  Follow-up in clinic in 3 months or sooner if needed.  More than 50 % of the time was spent for psychoeducation and supportive psychotherapy and care coordination.  This note was generated in part or whole with voice recognition software. Voice recognition is usually quite accurate but there are transcription errors that can and very often do occur. I apologize for any typographical errors that were not detected and  corrected.        Jomarie LongsSaramma Christopherjame Carnell, MD 06/21/2017, 1:02 PM

## 2017-08-08 DIAGNOSIS — H8103 Meniere's disease, bilateral: Secondary | ICD-10-CM | POA: Insufficient documentation

## 2017-08-17 ENCOUNTER — Encounter: Payer: Self-pay | Admitting: Psychiatry

## 2017-08-17 ENCOUNTER — Ambulatory Visit: Payer: No Typology Code available for payment source | Admitting: Psychiatry

## 2017-08-17 ENCOUNTER — Other Ambulatory Visit: Payer: Self-pay

## 2017-08-17 VITALS — BP 113/77 | HR 84 | Temp 98.5°F | Wt 203.0 lb

## 2017-08-17 DIAGNOSIS — F331 Major depressive disorder, recurrent, moderate: Secondary | ICD-10-CM | POA: Diagnosis not present

## 2017-08-17 DIAGNOSIS — F411 Generalized anxiety disorder: Secondary | ICD-10-CM

## 2017-08-17 MED ORDER — FLUOXETINE HCL 20 MG PO CAPS: 20.0000 mg | ORAL_CAPSULE | Freq: Every day | ORAL | 0 refills | Status: DC

## 2017-08-17 MED ORDER — TRAZODONE HCL 100 MG PO TABS: 100.0000 mg | ORAL_TABLET | Freq: Every day | ORAL | 0 refills | Status: DC

## 2017-08-17 MED ORDER — FLUOXETINE HCL 10 MG PO CAPS
10.0000 mg | ORAL_CAPSULE | Freq: Every day | ORAL | 0 refills | Status: DC
Start: 2017-08-17 — End: 2017-11-28

## 2017-08-17 MED ORDER — PROPRANOLOL HCL 10 MG PO TABS: 10.0000 mg | ORAL_TABLET | Freq: Two times a day (BID) | ORAL | 0 refills | Status: DC | PRN

## 2017-08-17 NOTE — Progress Notes (Signed)
BH MD OP Progress Note  08/17/2017 12:16 PM Sonya [name]  MRN:  [id number]  Chief Complaint: ' I am here for follow up." Chief Complaint    Follow-up; Medication Refill     HPI: Sonya [name] is a 27 year old Caucasian female who is currently employed, married, lives in [place], has a history of depression, anxiety, presented to the clinic today for a follow-up visit.   She today reports she continues to be compliant on her Prozac.  She however reports she has noticed some increased anxiety recently.  She wonders whether there is anything she can take to help with her anxiety symptoms.  She denies any significant sadness.  She denies any significant crying spells.  She denies any suicidality.  Discussed increasing her Prozac.  Patient although worried about side effects agrees with the same.  Provided medication education.  Patient is also interested in something that she can take as needed for increased anxiety.  She reports good response to Ativan in the past.  Discussed with patient that benzodiazepines may not be the right choice and offered her propranolol.  She agrees with plan.  Patient continues to take trazodone for sleep.  She denies any problems with the same.  Patient reports work is going well.  She enjoys it.  She reports she continues to have relationship conflicts with her husband.  Patient will start seeing Ms. Nolon RodNicole Peacock for psychotherapy.  Visit Diagnosis:    ICD-10-CM   1. MDD (major depressive disorder), recurrent episode, moderate (HCC) F33.1 FLUoxetine (PROZAC) 20 MG capsule    traZODone (DESYREL) 100 MG tablet  2. GAD (generalized anxiety disorder) F41.1   3. MDD (major depressive disorder), recurrent episode, moderate (HCC) F33.1 FLUoxetine (PROZAC) 20 MG capsule    traZODone (DESYREL) 100 MG tablet   early remission    Past Psychiatric History: Have reviewed past psychiatric history from my progress note on 06/21/2017.  Past trials of Lexapro, Wellbutrin,  Luvox, Pristiq.  Past Medical History:  Past Medical History:  Diagnosis Date  . Anxiety   . Depression   . Meniere disease UNKNOWN  . Vertigo     Past Surgical History:  Procedure Laterality Date  . WRIST SURGERY      Family Psychiatric History: Reviewed family psychiatric history from my progress note on 06/21/2017.  Family History:  Family History  Problem Relation Age of Onset  . Graves' disease Mother   . Drug abuse Father   . Drug abuse Sister   . Depression Brother     Social History: Reviewed  social history from my progress note on 06/21/2017. Social History   Socioeconomic History  . Marital status: Married    Spouse name: calvin  . Number of children: 1  . Years of education: Not on file  . Highest education level: Associate degree: occupational, Scientist, product/process developmenttechnical, or vocational program  Occupational History    Comment: not employed  Social Needs  . Financial resource strain: Hard  . Food insecurity:    Worry: Often true    Inability: Often true  . Transportation needs:    Medical: No    Non-medical: No  Tobacco Use  . Smoking status: Former Games developermoker  . Smokeless tobacco: Never Used  Substance and Sexual Activity  . Alcohol use: No  . Drug use: No  . Sexual activity: Yes  Lifestyle  . Physical activity:    Days per week: 0 days    Minutes per session: 0 min  . Stress: Not on file  Relationships  . Social connections:    Talks on phone: More than three times a week    Gets together: More than three times a week    Attends religious service: Never    Active member of club or organization: No    Attends meetings of clubs or organizations: Never    Relationship status: Married  Other Topics Concern  . Not on file  Social History Narrative  . Not on file    Allergies:  Allergies  Allergen Reactions  . Codeine Nausea And Vomiting    Metabolic Disorder Labs: No results found for: HGBA1C, MPG No results found for: PROLACTIN No results found for:  CHOL, TRIG, HDL, CHOLHDL, VLDL, LDLCALC Lab Results  Component Value Date   TSH 2.61 08/15/2013    Therapeutic Level Labs: No results found for: LITHIUM No results found for: VALPROATE No components found for:  CBMZ  Current Medications: Current Outpatient Medications  Medication Sig Dispense Refill  . FLUoxetine (PROZAC) 20 MG capsule Take 1 capsule (20 mg total) by mouth daily. Take it along with 10 mg 90 capsule 0  . ondansetron (ZOFRAN ODT) 4 MG disintegrating tablet Take 1 tablet (4 mg total) by mouth every 8 (eight) hours as needed for nausea or vomiting. 20 tablet 0  . pantoprazole (PROTONIX) 40 MG tablet Take by mouth.    . traZODone (DESYREL) 100 MG tablet Take 1 tablet (100 mg total) by mouth at bedtime. 90 tablet 0  . triamterene-hydrochlorothiazide (DYAZIDE) 37.5-25 MG capsule Take 1 capsule by mouth daily.  5  . FLUoxetine (PROZAC) 10 MG capsule Take 1 capsule (10 mg total) by mouth daily. Take it along with 20 mg 90 capsule 0  . propranolol (INDERAL) 10 MG tablet Take 1 tablet (10 mg total) by mouth 2 (two) times daily as needed. For severe anxiety sx 180 tablet 0   No current facility-administered medications for this visit.      Musculoskeletal: Strength & Muscle Tone: within normal limits Gait & Station: normal Patient leans: N/A  Psychiatric Specialty Exam: Review of Systems  Psychiatric/Behavioral: Positive for depression. The patient is nervous/anxious.   All other systems reviewed and are negative.   Blood pressure 113/77, pulse 84, temperature 98.5 F (36.9 C), temperature source Oral, weight 203 lb (92.1 kg).Body mass index is 37.13 kg/m.  General Appearance: Casual  Eye Contact:  Fair  Speech:  Normal Rate  Volume:  Normal  Mood:  Anxious and Dysphoric  Affect:  Congruent  Thought Process:  Goal Directed and Descriptions of Associations: Intact  Orientation:  Full (Time, Place, and Person)  Thought Content: Logical   Suicidal Thoughts:  No   Homicidal Thoughts:  No  Memory:  Immediate;   Fair Recent;   Fair Remote;   Fair  Judgement:  Fair  Insight:  Fair  Psychomotor Activity:  Normal  Concentration:  Concentration: Fair and Attention Span: Fair  Recall:  [employer] of Knowledge: Fair  Language: Fair  Akathisia:  No  Handed:  Right  AIMS (if indicated):na  Assets:  Communication Skills Desire for Improvement Social Support  ADL's:  Intact  Cognition: WNL  Sleep:  Fair   Screenings:   Assessment and Plan: Sonya [name] is a 27 year old Caucasian female who has a history of depression, anxiety, presented to the clinic today for a follow-up visit.  Patient today continues to have some anxiety symptoms.  She also has relationship conflicts with her husband.  Discussed pursuing psychotherapy.  She will  start seeing Ms. Nolon Rod here in clinic.  Also discussed medication readjustment.  Plan Depression Increase Prozac to 30 mg p.o. daily Continue CBT with Ms. Peacock  Insomnia Continue trazodone 100 mg p.o. nightly  For anxiety disorder Increase Prozac to 30 mg p.o. daily Add propranolol 10 mg p.o. twice daily as needed Provided medication education.  Follow-up in clinic in 8 weeks. Discussed with patient that I will not be available in July.  Patient will see Ms. Peacock during that time.  More than 50 % of the time was spent for psychoeducation and supportive psychotherapy and care coordination.  This note was generated in part or whole with voice recognition software. Voice recognition is usually quite accurate but there are transcription errors that can and very often do occur. I apologize for any typographical errors that were not detected and corrected.         Jomarie Longs, MD 08/17/2017, 3:16 PM

## 2017-08-17 NOTE — Patient Instructions (Signed)
Propranolol tablets What is this medicine? PROPRANOLOL (proe PRAN oh lole) is a beta-blocker. Beta-blockers reduce the workload on the heart and help it to beat more regularly. This medicine is used to treat high blood pressure, to control irregular heart rhythms (arrhythmias) and to relieve chest pain caused by angina. It may also be helpful after a heart attack. This medicine is also used to prevent migraine headaches, relieve uncontrollable shaking (tremors), and help certain problems related to the thyroid gland and adrenal gland. This medicine may be used for other purposes; ask your health care provider or pharmacist if you have questions. COMMON BRAND NAME(S): Inderal What should I tell my health care provider before I take this medicine? They need to know if you have any of these conditions: -circulation problems or blood vessel disease -diabetes -history of heart attack or heart disease, vasospastic angina -kidney disease -liver disease -lung or breathing disease, like asthma or emphysema -pheochromocytoma -slow heart rate -thyroid disease -an unusual or allergic reaction to propranolol, other beta-blockers, medicines, foods, dyes, or preservatives -pregnant or trying to get pregnant -breast-feeding How should I use this medicine? Take this medicine by mouth with a glass of water. Follow the directions on the prescription label. Take your doses at regular intervals. Do not take your medicine more often than directed. Do not stop taking except on your the advice of your doctor or health care professional. Talk to your pediatrician regarding the use of this medicine in children. Special care may be needed. Overdosage: If you think you have taken too much of this medicine contact a poison control center or emergency room at once. NOTE: This medicine is only for you. Do not share this medicine with others. What if I miss a dose? If you miss a dose, take it as soon as you can. If it is  almost time for your next dose, take only that dose. Do not take double or extra doses. What may interact with this medicine? Do not take this medicine with any of the following medications: -feverfew -phenothiazines like chlorpromazine, mesoridazine, prochlorperazine, thioridazine This medicine may also interact with the following medications: -aluminum hydroxide gel -antipyrine -antiviral medicines for HIV or AIDS -barbiturates like phenobarbital -certain medicines for blood pressure, heart disease, irregular heart beat -cimetidine -ciprofloxacin -diazepam -fluconazole -haloperidol -isoniazid -medicines for cholesterol like cholestyramine or colestipol -medicines for mental depression -medicines for migraine headache like almotriptan, eletriptan, frovatriptan, naratriptan, rizatriptan, sumatriptan, zolmitriptan -NSAIDs, medicines for pain and inflammation, like ibuprofen or naproxen -phenytoin -rifampin -teniposide -theophylline -thyroid medicines -tolbutamide -warfarin -zileuton This list may not describe all possible interactions. Give your health care provider a list of all the medicines, herbs, non-prescription drugs, or dietary supplements you use. Also tell them if you smoke, drink alcohol, or use illegal drugs. Some items may interact with your medicine. What should I watch for while using this medicine? Visit your doctor or health care professional for regular check ups. Check your blood pressure and pulse rate regularly. Ask your health care professional what your blood pressure and pulse rate should be, and when you should contact them. You may get drowsy or dizzy. Do not drive, use machinery, or do anything that needs mental alertness until you know how this drug affects you. Do not stand or sit up quickly, especially if you are an older patient. This reduces the risk of dizzy or fainting spells. Alcohol can make you more drowsy and dizzy. Avoid alcoholic drinks. This  medicine can affect blood sugar levels. If   you have diabetes, check with your doctor or health care professional before you change your diet or the dose of your diabetic medicine. Do not treat yourself for coughs, colds, or pain while you are taking this medicine without asking your doctor or health care professional for advice. Some ingredients may increase your blood pressure. What side effects may I notice from receiving this medicine? Side effects that you should report to your doctor or health care professional as soon as possible: -allergic reactions like skin rash, itching or hives, swelling of the face, lips, or tongue -breathing problems -changes in blood sugar -cold hands or feet -difficulty sleeping, nightmares -dry peeling skin -hallucinations -muscle cramps or weakness -slow heart rate -swelling of the legs and ankles -vomiting Side effects that usually do not require medical attention (report to your doctor or health care professional if they continue or are bothersome): -change in sex drive or performance -diarrhea -dry sore eyes -hair loss -nausea -weak or tired This list may not describe all possible side effects. Call your doctor for medical advice about side effects. You may report side effects to FDA at 1-800-FDA-1088. Where should I keep my medicine? Keep out of the reach of children. Store at room temperature between 15 and 30 degrees C (59 and 86 degrees F). Protect from light. Throw away any unused medicine after the expiration date. NOTE: This sheet is a summary. It may not cover all possible information. If you have questions about this medicine, talk to your doctor, pharmacist, or health care provider.  2018 Elsevier/Gold Standard (2012-10-26 14:51:53)  

## 2017-09-15 ENCOUNTER — Ambulatory Visit (HOSPITAL_COMMUNITY)
Admission: EM | Admit: 2017-09-15 | Discharge: 2017-09-15 | Disposition: A | Discharge status: Home / Self Care | Payer: No Typology Code available for payment source | Attending: Internal Medicine | Admitting: Internal Medicine

## 2017-09-15 ENCOUNTER — Encounter (HOSPITAL_COMMUNITY): Payer: Self-pay | Admitting: *Deleted

## 2017-09-15 ENCOUNTER — Emergency Department (HOSPITAL_COMMUNITY): Payer: No Typology Code available for payment source

## 2017-09-15 ENCOUNTER — Encounter (HOSPITAL_COMMUNITY): Payer: Self-pay

## 2017-09-15 ENCOUNTER — Other Ambulatory Visit: Payer: Self-pay

## 2017-09-15 ENCOUNTER — Emergency Department (HOSPITAL_COMMUNITY)
Admission: EM | Admit: 2017-09-15 | Discharge: 2017-09-15 | Disposition: A | Discharge status: Home / Self Care | Payer: No Typology Code available for payment source | Attending: Emergency Medicine | Admitting: Emergency Medicine

## 2017-09-15 DIAGNOSIS — Y829 Unspecified medical devices associated with adverse incidents: Secondary | ICD-10-CM | POA: Diagnosis not present

## 2017-09-15 DIAGNOSIS — T887XXA Unspecified adverse effect of drug or medicament, initial encounter: Secondary | ICD-10-CM | POA: Diagnosis not present

## 2017-09-15 DIAGNOSIS — R0789 Other chest pain: Secondary | ICD-10-CM | POA: Diagnosis not present

## 2017-09-15 DIAGNOSIS — H539 Unspecified visual disturbance: Secondary | ICD-10-CM

## 2017-09-15 DIAGNOSIS — Z87891 Personal history of nicotine dependence: Secondary | ICD-10-CM | POA: Diagnosis not present

## 2017-09-15 DIAGNOSIS — R42 Dizziness and giddiness: Secondary | ICD-10-CM

## 2017-09-15 DIAGNOSIS — Z79899 Other long term (current) drug therapy: Secondary | ICD-10-CM | POA: Diagnosis not present

## 2017-09-15 DIAGNOSIS — R079 Chest pain, unspecified: Secondary | ICD-10-CM | POA: Diagnosis present

## 2017-09-15 DIAGNOSIS — Z3202 Encounter for pregnancy test, result negative: Secondary | ICD-10-CM | POA: Diagnosis not present

## 2017-09-15 DIAGNOSIS — R55 Syncope and collapse: Secondary | ICD-10-CM

## 2017-09-15 DIAGNOSIS — T50905A Adverse effect of unspecified drugs, medicaments and biological substances, initial encounter: Secondary | ICD-10-CM

## 2017-09-15 LAB — POCT I-STAT, CHEM 8
BUN: 11 mg/dL (ref 6–20)
CHLORIDE: 101 mmol/L (ref 98–111)
Calcium, Ion: 1.27 mmol/L (ref 1.15–1.40)
Creatinine, Ser: 0.6 mg/dL (ref 0.44–1.00)
Glucose, Bld: 100 mg/dL — ABNORMAL HIGH (ref 70–99)
HCT: 41 % (ref 36.0–46.0)
Hemoglobin: 13.9 g/dL (ref 12.0–15.0)
Potassium: 3.5 mmol/L (ref 3.5–5.1)
SODIUM: 139 mmol/L (ref 135–145)
TCO2: 26 mmol/L (ref 22–32)

## 2017-09-15 LAB — POCT URINALYSIS DIP (DEVICE)
Bilirubin Urine: NEGATIVE
Glucose, UA: NEGATIVE mg/dL
Ketones, ur: NEGATIVE mg/dL
LEUKOCYTES UA: NEGATIVE
NITRITE: NEGATIVE
Protein, ur: NEGATIVE mg/dL
SPECIFIC GRAVITY, URINE: 1.015 (ref 1.005–1.030)
Urobilinogen, UA: 0.2 mg/dL (ref 0.0–1.0)
pH: 7.5 (ref 5.0–8.0)

## 2017-09-15 LAB — CBC WITH DIFFERENTIAL/PLATELET
Abs Immature Granulocytes: 0.1 10*3/uL (ref 0.0–0.1)
Basophils Absolute: 0.1 10*3/uL (ref 0.0–0.1)
Basophils Relative: 1 %
EOS ABS: 0.2 10*3/uL (ref 0.0–0.7)
Eosinophils Relative: 2 %
HEMATOCRIT: 39.4 % (ref 36.0–46.0)
HEMOGLOBIN: 12.4 g/dL (ref 12.0–15.0)
Immature Granulocytes: 1 %
LYMPHS ABS: 2.7 10*3/uL (ref 0.7–4.0)
LYMPHS PCT: 28 %
MCH: 27.1 pg (ref 26.0–34.0)
MCHC: 31.5 g/dL (ref 30.0–36.0)
MCV: 86.2 fL (ref 78.0–100.0)
MONO ABS: 0.4 10*3/uL (ref 0.1–1.0)
Monocytes Relative: 4 %
NEUTROS ABS: 6.4 10*3/uL (ref 1.7–7.7)
NEUTROS PCT: 64 %
Platelets: 304 10*3/uL (ref 150–400)
RBC: 4.57 MIL/uL (ref 3.87–5.11)
RDW: 12.9 % (ref 11.5–15.5)
WBC: 9.9 10*3/uL (ref 4.0–10.5)

## 2017-09-15 LAB — BASIC METABOLIC PANEL
ANION GAP: 8 (ref 5–15)
BUN: 9 mg/dL (ref 6–20)
CALCIUM: 9.6 mg/dL (ref 8.9–10.3)
CO2: 26 mmol/L (ref 22–32)
Chloride: 103 mmol/L (ref 98–111)
Creatinine, Ser: 0.6 mg/dL (ref 0.44–1.00)
Glucose, Bld: 101 mg/dL — ABNORMAL HIGH (ref 70–99)
Potassium: 3.6 mmol/L (ref 3.5–5.1)
SODIUM: 137 mmol/L (ref 135–145)

## 2017-09-15 LAB — POCT PREGNANCY, URINE: Preg Test, Ur: NEGATIVE

## 2017-09-15 LAB — I-STAT BETA HCG BLOOD, ED (MC, WL, AP ONLY): I-stat hCG, quantitative: <5 m[IU]/mL (ref <5)

## 2017-09-15 NOTE — ED Triage Notes (Signed)
States she started a new weight loss medication today c/o headache and heart racing.

## 2017-09-15 NOTE — ED Provider Notes (Signed)
MSE was initiated and I personally evaluated the patient and placed orders (if any) at  6:40 PM on September 15, 2017.  The patient appears stable so that the remainder of the MSE may be completed by another provider.  Patient placed in Quick Look pathway, seen and evaluated   Chief Complaint: chest pain, headache  HPI:   Patient, with a past medical history of anxiety, depression presents to ED for evaluation of dizziness, headache, lightheadedness, chest pain and shortness of breath, headache.  She took a dose of phentermine for the first time around 1 PM today.  Symptoms began shortly thereafter.  States that her blood pressure has been "running high, I am usually in the 100s or low 90s."  She was seen and evaluated at urgent care and was sent here for further work-up.  Denies any diagnosis of hypertension.  She does take propranolol as needed for anxiety as well as triamterene for fluid.  My, PE.  ROS: Chest pain  Physical Exam:   Gen: No distress  Neuro: Awake and Alert  Skin: Warm    Focused Exam: Patient appears anxious.  No facial asymmetry noted. PERRL. Equal grip strength bilaterally. RRR. Lungs CTAB.   Initiation of care has begun. The patient has been counseled on the process, plan, and necessity for staying for the completion/evaluation, and the remainder of the medical screening examination    Dietrich PatesKhatri, Sonya Seith, PA-C 09/15/17 [id number]    Gerhard MunchLockwood, Robert, MD 09/15/17 [id number]

## 2017-09-15 NOTE — ED Triage Notes (Signed)
Pt states that she went to [facility] today for HTN, CP, and generally not feeling well. Pt states that she was orthostatic positive at UC. Pt also [facility]

## 2017-09-15 NOTE — Discharge Instructions (Signed)
Please go to emergency room for further evaluation/workup

## 2017-09-15 NOTE — ED Provider Notes (Signed)
[facility] EMERGENCY DEPARTMENT Provider Note   CSN: [id number] Arrival date & time: 09/15/17  1828  History   Chief Complaint Chief Complaint  Patient presents with  . Chest Pain   HPI  Patient is a 27 year old female with history of Mnire's disease, anxiety, and depression presenting to the ED for dizziness, headache, chest pain, dyspnea onset this morning. She states she took her first dose of phentermine (37.5 mg) and less than an hour later had onset of the above symptoms. Symptoms have been persistent since that time. She also developed bilateral blurred vision this evening. No vomiting or diarrhea. She had never taken this medication before. No syncope. She continues to have lightheadedness particularly with position changes. No other recent illness. No history of DVT/PE. No family history of congenital heart disease or sudden death at young age.  Past Medical History:  Diagnosis Date  . Anxiety   . Depression   . Meniere disease UNKNOWN  . Vertigo     Patient Active Problem List   Diagnosis Date Noted  . Indication for care in labor and delivery, delivered 12/26/2014  . Indication for care or intervention related to labor and delivery 12/23/2014  . Labor and delivery, indication for care 10/13/2014    Past Surgical History:  Procedure Laterality Date  . WRIST SURGERY       OB History    Gravida  2   Para  1   Term  1   Preterm      AB  1   Living  1     SAB  1   TAB      Ectopic      Multiple  0   Live Births  1            Home Medications    Prior to Admission medications   Medication Sig Start Date End Date Taking? Authorizing Provider  Aspirin-Acetaminophen-Caffeine (GOODY HEADACHE PO) Take 1 packet by mouth once as needed (for headaches).   Yes [provider]  Ferrous Sulfate (IRON) 28 MG TABS Take 28 mg by mouth daily.   Yes [provider]  FLUoxetine (PROZAC) 10 MG capsule Take 1 capsule (10  mg total) by mouth daily. Take it along with 20 mg Patient taking differently: Take 10 mg by mouth See admin instructions. Take 10 mg by mouth once a day- in conjunction with one 20 mg capsule to equal a total dose of 30 milligrams 08/17/17  Yes Eappen, Levin Bacon, MD  FLUoxetine (PROZAC) 20 MG capsule Take 1 capsule (20 mg total) by mouth daily. Take it along with 10 mg Patient taking differently: Take 20 mg by mouth See admin instructions. Take 20 mg by mouth once a day- in conjunction with one 10 mg capsule to equal a total dose of 30 milligrams 08/17/17  Yes Eappen, Saramma, MD  fluticasone (FLONASE) 50 MCG/ACT nasal spray Place 1 spray into both nostrils daily.  09/08/17  Yes [provider]  ibuprofen (ADVIL,MOTRIN) 800 MG tablet Take 800 mg by mouth every 6 (six) hours as needed (for pain or migraines).   Yes [provider]  Lactobacillus Rhamnosus, GG, ([employer] PROBIOTIC, LACTOBACILLUS,) CAPS Take 1 capsule by mouth daily.   Yes [provider]  meclizine (ANTIVERT) 25 MG tablet Take 25 mg by mouth 3 (three) times daily as needed (for vertigo).  09/08/17  Yes [provider]  Multiple Vitamins-Minerals (ONE-A-DAY WOMENS VITACRAVES) CHEW Chew 2 tablets by mouth daily.  Yes [provider]  ondansetron (ZOFRAN ODT) 4 MG disintegrating tablet Take 1 tablet (4 mg total) by mouth every 8 (eight) hours as needed for nausea or vomiting. 05/28/17  Yes Irean HongSung, Jade J, MD  pantoprazole (PROTONIX) 40 MG tablet Take 40 mg by mouth daily before breakfast.    Yes [provider]  propranolol (INDERAL) 10 MG tablet Take 1 tablet (10 mg total) by mouth 2 (two) times daily as needed. For severe anxiety sx Patient taking differently: Take 10 mg by mouth 2 (two) times daily as needed (for severe anxiety symptoms).  08/17/17  Yes Jomarie LongsEappen, Saramma, MD  traZODone (DESYREL) 100 MG tablet Take 1 tablet (100 mg total) by mouth at bedtime. 08/17/17  Yes Jomarie LongsEappen, Saramma, MD    triamterene-hydrochlorothiazide (DYAZIDE) 37.5-25 MG capsule Take 1 capsule by mouth daily. 01/18/17  Yes [provider]  VENTOLIN HFA 108 (90 Base) MCG/ACT inhaler Inhale 2 puffs into the lungs every 6 (six) hours as needed for wheezing or shortness of breath.  09/15/17  Yes [provider]    Family History Family History  Problem Relation Age of Onset  . Graves' disease Mother   . Drug abuse Father   . Drug abuse Sister   . Depression Brother     Social History Social History   Tobacco Use  . Smoking status: Former Games developermoker  . Smokeless tobacco: Never Used  Substance Use Topics  . Alcohol use: No  . Drug use: No     Allergies   Adipex-p [phentermine]; Codeine; and Adhesive [tape]   Review of Systems Review of Systems  Constitutional: Positive for fatigue. Negative for fever.  HENT: Negative for congestion and sore throat.   Eyes: Positive for visual disturbance.  Respiratory: Positive for shortness of breath.   Cardiovascular: Positive for chest pain and palpitations.  Gastrointestinal: Negative for abdominal pain, diarrhea and vomiting.  Genitourinary: Negative for dysuria.  Musculoskeletal: Negative for neck pain.  Neurological: Positive for weakness and light-headedness. Negative for syncope.  All other systems reviewed and are negative.    Physical Exam Updated Vital Signs BP 124/75 (BP Location: Right Arm)   Pulse 75   Temp 98.1 F (36.7 C) (Oral)   Resp 12   Ht 5\' 2"  (1.575 m)   Wt 95.3 kg (210 lb)   LMP 09/04/2017   SpO2 100%   BMI 38.41 kg/m   Physical Exam  Constitutional: She is oriented to person, place, and time. No distress.  HENT:  Head: Normocephalic and atraumatic.  Mouth/Throat: Oropharynx is clear and moist.  Eyes: Pupils are equal, round, and reactive to light. Conjunctivae and EOM are normal.  Neck: Neck supple. No tracheal deviation present.  Cardiovascular: Normal rate, regular rhythm, normal heart sounds and  intact distal pulses.  No murmur heard. Pulmonary/Chest: Effort normal and breath sounds normal. No stridor. No respiratory distress. She has no wheezes. She has no rales.  Abdominal: Soft. She exhibits no distension and no mass. There is no tenderness. There is no guarding.  Musculoskeletal: She exhibits no edema or deformity.  Neurological: She is alert and oriented to person, place, and time.  Speech is fluent. CN II-XII tested and found to be intact. 5/5 strength in all extremities with sensation grossly intact to light touch throughout. Negative Romberg. Normal gait.  Skin: Skin is warm and dry.  Psychiatric: She has a normal mood and affect. Her behavior is normal.  Nursing note and vitals reviewed.    ED Treatments /  Results  Labs (all labs ordered are listed, but only abnormal results are displayed) Labs Reviewed  BASIC METABOLIC PANEL - Abnormal; Notable for the following components:      Result Value   Glucose, Bld 101 (*)    All other components within normal limits  CBC WITH DIFFERENTIAL/PLATELET  URINALYSIS, ROUTINE W REFLEX MICROSCOPIC  I-STAT BETA HCG BLOOD, ED ([facility], [facility], [facility] ONLY)    EKG None  Radiology Dg Chest 2 View  Result Date: 09/15/2017 CLINICAL DATA:  Initial evaluation for acute chest pain. EXAM: CHEST - 2 VIEW COMPARISON:  Prior radiograph from 07/10/2015. FINDINGS: The cardiac and mediastinal silhouettes are stable in size and contour, and remain within normal limits. The lungs are normally inflated. No airspace consolidation, pleural effusion, or pulmonary edema is identified. There is no pneumothorax. No acute osseous abnormality identified. IMPRESSION: No active cardiopulmonary disease. Electronically Signed   By: Rise Mu M.D.   On: 09/15/2017 19:33    Procedures Procedures (including critical care time)   EMERGENCY DEPARTMENT [place] CARDIAC EXAM "Study: Limited Ultrasound of the Heart and Pericardium" INDICATIONS:Chest pain Multiple views  of the heart and pericardium were obtained in real-time with a multi-frequency probe. PERFORMED WU:[id number] IMAGES ARCHIVED?: Yes LIMITATIONS:  Body habitus VIEWS USED: Subcostal 4 chamber, Parasternal long axis, Parasternal short axis, Apical 4 chamber  and Inferior Vena Cava INTERPRETATION: Cardiac activity present, Pericardial effusioin absent, Normal contractility and Volume status normal   Medications Ordered in ED Medications - No data to display   Initial Impression / Assessment and Plan / ED Course  I have reviewed the triage vital signs and the nursing notes.  Pertinent labs & imaging results that were available during my care of the patient were reviewed by me and considered in my medical decision making (see chart for details).  This is a young female presenting to the ED for multiple symptoms after initiating phentermine as above. Clinical picture was consistent with medication side effect. Regarding chest pain, EKG is normal. No red flags for structural heart disease. Do not suspect ACS in this young female, do not feel biomarkers warranted. PERC negative, do not suspect PE. Bedside point of care echo is normal, do not suspect CHF. Regarding headache and blurred vision, her visual acuity is mildly depressed here but her vision is subjectively improved on multiple rechecks. With bilateral symptoms, do not suspect glaucoma. With otherwise normal neurologic exam, do not suspect intracranial abnormality such as increased ICP or ICH. She is ambulatory in ED. Will discharge home.  Patient informed of all ED findings. Return precautions and follow up plan reviewed. Importance of discontinuing phentermine emphasized. All questions answered.   Final Clinical Impressions(s) / ED Diagnoses   Final diagnoses:  Atypical chest pain  Medication side effect, initial encounter     Cecille Po, MD 09/15/17 4782    Gerhard Munch, MD 09/18/17 0009

## 2017-09-15 NOTE — ED Provider Notes (Signed)
[facility]    CSN: [id number] Arrival date & time: 09/15/17  1622     History   Chief Complaint Chief Complaint  Patient presents with  . Headache    HPI Sonya [name] is a 27 y.o. female history of anxiety, depression, Mnire's presenting today for evaluation of dizziness.  Patient states that earlier today she took phentermine for the first time around 1:00, recently after taking the medicine she developed severe headache, dizziness, sensation of presyncope, blurry vision, chest discomfort and shortness of breath.  She is also felt like her heart has been pounding more as well as foot swelling.  She went to work today and had her blood pressure checked which was around 145/85, patient states that her blood pressure is typically in the low 100s.  She has taken ibuprofen 800 since the symptoms began which has helped with her headache, but her chest tightness and shortness of breath has continued to worsen.  Her vision changes are off and on, but at baseline she has had stable blurriness with a sensation of diplopia and an outlying around objects.  She has never had these sensations before and feels different than her typical anxiety.  HPI  Past Medical History:  Diagnosis Date  . Anxiety   . Depression   . Meniere disease UNKNOWN  . Vertigo     Patient Active Problem List   Diagnosis Date Noted  . Indication for care in labor and delivery, delivered 12/26/2014  . Indication for care or intervention related to labor and delivery 12/23/2014  . Labor and delivery, indication for care 10/13/2014    Past Surgical History:  Procedure Laterality Date  . WRIST SURGERY      OB History    Gravida  2   Para  1   Term  1   Preterm      AB  1   Living  1     SAB  1   TAB      Ectopic      Multiple  0   Live Births  1            Home Medications    Prior to Admission medications   Medication Sig Start Date End Date Taking? Authorizing Provider    FLUoxetine (PROZAC) 10 MG capsule Take 1 capsule (10 mg total) by mouth daily. Take it along with 20 mg 08/17/17   Jomarie Longs, MD  FLUoxetine (PROZAC) 20 MG capsule Take 1 capsule (20 mg total) by mouth daily. Take it along with 10 mg 08/17/17   Jomarie Longs, MD  ondansetron (ZOFRAN ODT) 4 MG disintegrating tablet Take 1 tablet (4 mg total) by mouth every 8 (eight) hours as needed for nausea or vomiting. 05/28/17   Irean Hong, MD  pantoprazole (PROTONIX) 40 MG tablet Take by mouth.    [provider]  propranolol (INDERAL) 10 MG tablet Take 1 tablet (10 mg total) by mouth 2 (two) times daily as needed. For severe anxiety sx 08/17/17   Jomarie Longs, MD  traZODone (DESYREL) 100 MG tablet Take 1 tablet (100 mg total) by mouth at bedtime. 08/17/17   Jomarie Longs, MD  triamterene-hydrochlorothiazide (DYAZIDE) 37.5-25 MG capsule Take 1 capsule by mouth daily. 01/18/17   [provider]    Family History Family History  Problem Relation Age of Onset  . Graves' disease Mother   . Drug abuse Father   . Drug abuse Sister   . Depression Brother  Social History Social History   Tobacco Use  . Smoking status: Former Games developermoker  . Smokeless tobacco: Never Used  Substance Use Topics  . Alcohol use: No  . Drug use: No     Allergies   Codeine   Review of Systems Review of Systems  Constitutional: Negative for fatigue and fever.  HENT: Negative for congestion, sinus pressure and sore throat.   Eyes: Positive for visual disturbance. Negative for photophobia and pain.  Respiratory: Positive for chest tightness and shortness of breath. Negative for cough.   Cardiovascular: Positive for chest pain, palpitations and leg swelling.  Gastrointestinal: Positive for nausea. Negative for abdominal pain and vomiting.  Genitourinary: Negative for decreased urine volume and hematuria.  Musculoskeletal: Positive for neck pain. Negative for myalgias and neck stiffness.   Neurological: Positive for dizziness, light-headedness and headaches. Negative for syncope, facial asymmetry, speech difficulty, weakness and numbness.     Physical Exam Triage Vital Signs ED Triage Vitals  Enc Vitals Group     BP 09/15/17 1645 (!) 143/78     Pulse Rate 09/15/17 1645 86     Resp 09/15/17 1645 18     Temp 09/15/17 1645 98.4 F (36.9 C)     Temp src --      SpO2 09/15/17 1645 100 %     Weight --      Height --      Head Circumference --      Peak Flow --      Pain Score 09/15/17 1648 5     Pain Loc --      Pain Edu? --      Excl. in GC? --    Orthostatic VS for the past 24 hrs:  BP- Lying Pulse- Lying BP- Sitting Pulse- Sitting  09/15/17 1721 132/75 78 139/76 74    Updated Vital Signs BP (!) 143/78 (BP Location: Right Arm)   Pulse 86   Temp 98.4 F (36.9 C)   Resp 18   LMP 09/04/2017   SpO2 100%  Blood pressure recheck prior to discharge was 134/75 Visual Acuity Right Eye Distance: 20/200 Left Eye Distance: 20/100 Bilateral Distance: 20/100  Right Eye Near:   Left Eye Near:    Bilateral Near:     Physical Exam  Constitutional: She is oriented to person, place, and time. She appears well-developed and well-nourished. No distress.  At one point during visit, but comes tearful and flushed  HENT:  Head: Normocephalic and atraumatic.  Mouth/Throat: Oropharynx is clear and moist.  Bilateral ears without tenderness to palpation of external auricle, tragus and mastoid, EAC's without erythema or swelling, TM's with good bony landmarks and cone of light. Non erythematous.  Oral mucosa pink and moist, no tonsillar enlargement or exudate. Posterior pharynx patent and nonerythematous, no uvula deviation or swelling. Normal phonation.  Eyes: Pupils are equal, round, and reactive to light. Conjunctivae and EOM are normal.  Neck: Neck supple.  Full active range of motion of neck  Cardiovascular: Normal rate and regular rhythm.  No murmur heard. Chest  discomfort not reproducible with palpation  Pulmonary/Chest: Effort normal and breath sounds normal. No respiratory distress.  Breathing comfortably at rest, CTABL, no wheezing, rales or other adventitious sounds auscultated  Abdominal: Soft. There is no tenderness.  Musculoskeletal: She exhibits no edema.  Neurological: She is alert and oriented to person, place, and time. She displays normal reflexes. No cranial nerve deficit. Coordination normal.  Patient A&O x3, cranial nerves II-XII grossly intact, strength  at shoulders, hips and knees 5/5, equal bilaterally, patellar reflex 2+ bilaterally.Gait without abnormality.  Skin: Skin is warm and dry.  Psychiatric: She has a normal mood and affect.  Nursing note and vitals reviewed.    UC Treatments / Results  Labs (all labs ordered are listed, but only abnormal results are displayed) Labs Reviewed  POCT I-STAT, CHEM 8 - Abnormal; Notable for the following components:      Result Value   Glucose, Bld 100 (*)    All other components within normal limits  POCT URINALYSIS DIP (DEVICE) - Abnormal; Notable for the following components:   Hgb urine dipstick TRACE (*)    All other components within normal limits  POCT PREGNANCY, URINE    EKG None  Radiology No results found.  Procedures Procedures (including critical care time)  Medications Ordered in UC Medications - No data to display  Initial Impression / Assessment and Plan / UC Course  I have reviewed the triage vital signs and the nursing notes.  Pertinent labs & imaging results that were available during my care of the patient were reviewed by me and considered in my medical decision making (see chart for details).    EKG normal sinus rhythm, slight depression in lead III, stable from previous EKG in 2017.  Patient has persistent chest tightness, worsening through visit, visual acuity significantly decreased.  Symptoms likely related to taking phentermine in combination with  her anxiety, but given persistent chest tightness that began today, as well as persistent decreased visual acuity will recommend further evaluation/observation in emergency room.  Cannot rule out more neurological causes of vision changes or or cardiac causes of chest tightness.  Patient verbalized understanding, patient stable at discharge, wheeled to emergency room under care of nursing staff.Discussed strict return precautions. Patient verbalized understanding and is agreeable with plan.  Discussed with Dr. Lum Babe. Final Clinical Impressions(s) / UC Diagnoses   Final diagnoses:  Vision changes  Pre-syncope  Chest pain, unspecified type     Discharge Instructions     Please go to emergency room for further evaluation/workup    ED Prescriptions    None     Controlled Substance Prescriptions [place] Controlled Substance Registry consulted? Not Applicable   Lew Dawes, [place] 09/15/17 [phone]

## 2017-09-15 NOTE — ED Triage Notes (Signed)
Pt states that she takes medication for BP and was also started on a new medication today.

## 2017-09-19 ENCOUNTER — Ambulatory Visit: Payer: BC Managed Care – PPO | Admitting: Psychiatry

## 2017-09-19 ENCOUNTER — Ambulatory Visit: Payer: BC Managed Care – PPO | Admitting: Licensed Clinical Social Worker

## 2017-10-07 ENCOUNTER — Encounter: Payer: Self-pay | Admitting: Emergency Medicine

## 2017-10-07 ENCOUNTER — Ambulatory Visit
Admission: EM | Admit: 2017-10-07 | Discharge: 2017-10-07 | Disposition: A | Discharge status: Home / Self Care | Payer: No Typology Code available for payment source | Attending: Family Medicine | Admitting: Family Medicine

## 2017-10-07 ENCOUNTER — Other Ambulatory Visit: Payer: Self-pay

## 2017-10-07 DIAGNOSIS — R197 Diarrhea, unspecified: Secondary | ICD-10-CM | POA: Diagnosis not present

## 2017-10-07 DIAGNOSIS — Z79899 Other long term (current) drug therapy: Secondary | ICD-10-CM | POA: Insufficient documentation

## 2017-10-07 DIAGNOSIS — R1110 Vomiting, unspecified: Secondary | ICD-10-CM | POA: Diagnosis not present

## 2017-10-07 DIAGNOSIS — R109 Unspecified abdominal pain: Secondary | ICD-10-CM | POA: Diagnosis not present

## 2017-10-07 DIAGNOSIS — Z87891 Personal history of nicotine dependence: Secondary | ICD-10-CM | POA: Insufficient documentation

## 2017-10-07 DIAGNOSIS — R112 Nausea with vomiting, unspecified: Secondary | ICD-10-CM | POA: Diagnosis not present

## 2017-10-07 LAB — URINALYSIS, COMPLETE (UACMP) WITH MICROSCOPIC
BACTERIA UA: NONE SEEN
Bilirubin Urine: NEGATIVE
GLUCOSE, UA: NEGATIVE mg/dL
Hgb urine dipstick: NEGATIVE
KETONES UR: NEGATIVE mg/dL
Leukocytes, UA: NEGATIVE
Nitrite: NEGATIVE
PROTEIN: NEGATIVE mg/dL
Specific Gravity, Urine: 1.019 (ref 1.005–1.030)
pH: 6 (ref 5.0–8.0)

## 2017-10-07 LAB — COMPREHENSIVE METABOLIC PANEL
ALT: 22 U/L (ref 0–44)
ANION GAP: 7 (ref 5–15)
AST: 22 U/L (ref 15–41)
Albumin: 3.9 g/dL (ref 3.5–5.0)
Alkaline Phosphatase: 59 U/L (ref 38–126)
BUN: 12 mg/dL (ref 6–20)
CHLORIDE: 106 mmol/L (ref 98–111)
CO2: 25 mmol/L (ref 22–32)
Calcium: 8.8 mg/dL — ABNORMAL LOW (ref 8.9–10.3)
Creatinine, Ser: 0.51 mg/dL (ref 0.44–1.00)
Glucose, Bld: 103 mg/dL — ABNORMAL HIGH (ref 70–99)
POTASSIUM: 3.5 mmol/L (ref 3.5–5.1)
SODIUM: 138 mmol/L (ref 135–145)
Total Bilirubin: 0.4 mg/dL (ref 0.3–1.2)
Total Protein: 7.4 g/dL (ref 6.5–8.1)

## 2017-10-07 LAB — CBC
HEMATOCRIT: 34.5 % — AB (ref 35.0–47.0)
HEMOGLOBIN: 11.8 g/dL — AB (ref 12.0–16.0)
MCH: 28.3 pg (ref 26.0–34.0)
MCHC: 34.3 g/dL (ref 32.0–36.0)
MCV: 82.5 fL (ref 80.0–100.0)
PLATELETS: 248 10*3/uL (ref 150–440)
RBC: 4.19 MIL/uL (ref 3.80–5.20)
RDW: 13.7 % (ref 11.5–14.5)
WBC: 11 10*3/uL (ref 3.6–11.0)

## 2017-10-07 LAB — POC URINE PREG, ED: PREG TEST UR: NEGATIVE

## 2017-10-07 LAB — LIPASE, BLOOD: LIPASE: 35 U/L (ref 11–51)

## 2017-10-07 MED ORDER — ONDANSETRON 4 MG PO TBDP
4.0000 mg | ORAL_TABLET | Freq: Once | ORAL | Status: AC
  Administered 2017-10-07: 4 mg via ORAL

## 2017-10-07 MED ORDER — SODIUM CHLORIDE 0.9 % IV BOLUS
1000.0000 mL | Freq: Once | INTRAVENOUS | Status: AC
  Administered 2017-10-07: 1000 mL via INTRAVENOUS

## 2017-10-07 MED ORDER — ONDANSETRON 4 MG PO TBDP: 4.0000 mg | ORAL_TABLET | Freq: Four times a day (QID) | ORAL | 0 refills | Status: DC | PRN

## 2017-10-07 NOTE — Discharge Instructions (Addendum)
Please sit fluids and eat a bland diet.  Use Zofran as needed for nausea and vomiting.  Go to the ER for any uncontrolled vomiting, abdominal pain or fevers.

## 2017-10-07 NOTE — ED Triage Notes (Signed)
Pt comes into the ED via POV c/o N/V/D since Thursday and is now c/o weakness and generalized malaise.  Patient has even and unlabored respirations.  Patient states she has had 3-4 episodes of emesis today.  Patient tachycardic at this time.

## 2017-10-07 NOTE — ED Provider Notes (Signed)
[facility]    CSN: [id number] Arrival date & time: 10/07/17  1526     History   Chief Complaint Chief Complaint  Patient presents with  . Vomiting    HPI Sonya [name] is a 27 y.o. female.   Presents to the urgent care facility for evaluation of vomiting and diarrhea.  Patient states she is had nausea vomiting and diarrhea since Thursday of last week.  She is noticing improvement yesterday but today having increased diarrhea with abdominal cramping along with 2 episodes of vomiting.  No blood noted in vomit or stool.  Patient denies any fevers.  No abdominal pain.  Describes abdominal discomfort as cramping.  She denies any chance of pregnancy, 2 days ago had serum hCG levels tested indicating no pregnancy.  She is been able to drink some water.  Patient took Zofran tablet at home but threw this up.  She has not had any other medications for cramping, vomiting or diarrhea.  She denies any cough congestion runny nose rashes fevers or headaches  HPI  Past Medical History:  Diagnosis Date  . Anxiety   . Depression   . Meniere disease UNKNOWN  . Vertigo     Patient Active Problem List   Diagnosis Date Noted  . Indication for care in labor and delivery, delivered 12/26/2014  . Indication for care or intervention related to labor and delivery 12/23/2014  . Labor and delivery, indication for care 10/13/2014    Past Surgical History:  Procedure Laterality Date  . WRIST SURGERY      OB History    Gravida  2   Para  1   Term  1   Preterm      AB  1   Living  1     SAB  1   TAB      Ectopic      Multiple  0   Live Births  1            Home Medications    Prior to Admission medications   Medication Sig Start Date End Date Taking? Authorizing Provider  Dexlansoprazole (DEXILANT) 30 MG capsule Take 30 mg by mouth daily.   Yes [provider]  Ferrous Sulfate (IRON) 28 MG TABS Take 28 mg by mouth daily.   Yes [provider]    FLUoxetine (PROZAC) 10 MG capsule Take 1 capsule (10 mg total) by mouth daily. Take it along with 20 mg Patient taking differently: Take 10 mg by mouth See admin instructions. Take 10 mg by mouth once a day- in conjunction with one 20 mg capsule to equal a total dose of 30 milligrams 08/17/17  Yes Eappen, Levin BaconSaramma, MD  FLUoxetine (PROZAC) 20 MG capsule Take 1 capsule (20 mg total) by mouth daily. Take it along with 10 mg Patient taking differently: Take 20 mg by mouth See admin instructions. Take 20 mg by mouth once a day- in conjunction with one 10 mg capsule to equal a total dose of 30 milligrams 08/17/17  Yes Eappen, Saramma, MD  fluticasone (FLONASE) 50 MCG/ACT nasal spray Place 1 spray into both nostrils daily.  09/08/17  Yes [provider]  ibuprofen (ADVIL,MOTRIN) 800 MG tablet Take 800 mg by mouth every 6 (six) hours as needed (for pain or migraines).   Yes [provider]  Lactobacillus Rhamnosus, GG, ([employer] PROBIOTIC, LACTOBACILLUS,) CAPS Take 1 capsule by mouth daily.   Yes [provider]  meclizine (ANTIVERT) 25 MG tablet Take  25 mg by mouth 3 (three) times daily as needed (for vertigo).  09/08/17  Yes [provider]  Multiple Vitamins-Minerals (ONE-A-DAY WOMENS VITACRAVES) CHEW Chew 2 tablets by mouth daily.   Yes [provider]  propranolol (INDERAL) 10 MG tablet Take 1 tablet (10 mg total) by mouth 2 (two) times daily as needed. For severe anxiety sx Patient taking differently: Take 10 mg by mouth 2 (two) times daily as needed (for severe anxiety symptoms).  08/17/17  Yes Jomarie Longs, MD  traZODone (DESYREL) 100 MG tablet Take 1 tablet (100 mg total) by mouth at bedtime. 08/17/17  Yes Jomarie Longs, MD  triamterene-hydrochlorothiazide (DYAZIDE) 37.5-25 MG capsule Take 1 capsule by mouth daily. 01/18/17  Yes [provider]  VENTOLIN HFA 108 (90 Base) MCG/ACT inhaler Inhale 2 puffs into the lungs every 6 (six) hours as needed for  wheezing or shortness of breath.  09/15/17  Yes [provider]  Aspirin-Acetaminophen-Caffeine (GOODY HEADACHE PO) Take 1 packet by mouth once as needed (for headaches).    [provider]  ondansetron (ZOFRAN ODT) 4 MG disintegrating tablet Take 1 tablet (4 mg total) by mouth every 6 (six) hours as needed for nausea or vomiting. 10/07/17   Evon Slack, PA-C  pantoprazole (PROTONIX) 40 MG tablet Take 40 mg by mouth daily before breakfast.     [provider]    Family History Family History  Problem Relation Age of Onset  . Graves' disease Mother   . Drug abuse Father   . Drug abuse Sister   . Depression Brother     Social History Social History   Tobacco Use  . Smoking status: Former Games developer  . Smokeless tobacco: Never Used  Substance Use Topics  . Alcohol use: No  . Drug use: No     Allergies   Adipex-p [phentermine]; Codeine; and Adhesive [tape]   Review of Systems Review of Systems  Constitutional: Negative for activity change, chills, fatigue and fever.  HENT: Negative for congestion, sinus pressure and sore throat.   Eyes: Negative for visual disturbance.  Respiratory: Negative for cough, chest tightness and shortness of breath.   Cardiovascular: Negative for chest pain and leg swelling.  Gastrointestinal: Positive for diarrhea, nausea and vomiting. Negative for abdominal pain.       Positive for cramping  Genitourinary: Negative for dysuria, pelvic pain, vaginal bleeding and vaginal discharge.  Musculoskeletal: Negative for arthralgias and gait problem.  Skin: Negative for rash.  Neurological: Negative for weakness, numbness and headaches.  Hematological: Negative for adenopathy.  Psychiatric/Behavioral: Negative for agitation, behavioral problems and confusion.     Physical Exam Triage Vital Signs ED Triage Vitals [10/07/17 1539]  Enc Vitals Group     BP 122/74     Pulse Rate (!) 109     Resp      Temp 98.7 F (37.1 C)      Temp Source Oral     SpO2 100 %     Weight 203 lb (92.1 kg)     Height 5\' 2"  (1.575 m)     Head Circumference      Peak Flow      Pain Score 7     Pain Loc      Pain Edu?      Excl. in [place]?    No data found.  Updated Vital Signs BP 122/74   Pulse (!) 109   Temp 98.7 F (37.1 C) (Oral)   Ht 5\' 2"  (1.575 m)  Wt 203 lb (92.1 kg)   LMP 09/06/2017   SpO2 100%   BMI 37.13 kg/m   Visual Acuity Right Eye Distance:   Left Eye Distance:   Bilateral Distance:    Right Eye Near:   Left Eye Near:    Bilateral Near:     Physical Exam  Constitutional: She is oriented to person, place, and time. She appears well-developed and well-nourished. No distress.  HENT:  Head: Normocephalic and atraumatic.  Mouth/Throat: Oropharynx is clear and moist. No oropharyngeal exudate.  Eyes: Pupils are equal, round, and reactive to light. Conjunctivae and EOM are normal. Right eye exhibits no discharge. Left eye exhibits no discharge.  Neck: Normal range of motion. Neck supple.  Cardiovascular: Regular rhythm, normal heart sounds and intact distal pulses.  Pulmonary/Chest: Effort normal and breath sounds normal. No respiratory distress. She exhibits no tenderness.  Abdominal: Soft. Bowel sounds are normal. She exhibits no distension and no mass. There is no tenderness. There is no guarding.  Abdomen is soft nontender nondistended  Musculoskeletal: Normal range of motion. She exhibits no edema.  Lymphadenopathy:    She has no cervical adenopathy.  Neurological: She is alert and oriented to person, place, and time. She has normal reflexes.  Skin: Skin is warm and dry.  Psychiatric: She has a normal mood and affect. Her behavior is normal. Judgment and thought content normal.     UC Treatments / Results  Labs (all labs ordered are listed, but only abnormal results are displayed) Labs Reviewed - No data to display  EKG None  Radiology No results found.  Procedures Procedures (including  critical care time)  Medications Ordered in UC Medications  ondansetron (ZOFRAN-ODT) disintegrating tablet 4 mg (4 mg Oral Given 10/07/17 1557)  ondansetron (ZOFRAN-ODT) disintegrating tablet 4 mg (4 mg Oral Given 10/07/17 1624)    Initial Impression / Assessment and Plan / UC Course  I have reviewed the triage vital signs and the nursing notes.  Pertinent labs & imaging results that were available during my care of the patient were reviewed by me and considered in my medical decision making (see chart for details).     27 year old female with nausea vomiting and diarrhea since Thursday.  She is been afebrile.  Abdominal exam is unremarkable.  Patient is given Zofran ODT in the clinic, initially saw some relief but then had a episode of dry heaving.  She was given a second dose of Zofran ODT and was able to tolerate some fluids.  Patient is educated about eating a bland diet and sipping fluids.  She understands that if she has continued nausea and vomiting despite Zofran and is unable to keep fluids down and continues to have diarrhea she will need to go to the emergency department for further evaluation.  Patient also understands that if she develops any abdominal pain or fever she is to go to the emergency department. Final Clinical Impressions(s) / UC Diagnoses   Final diagnoses:  Vomiting and diarrhea     Discharge Instructions     Please sit fluids and eat a bland diet.  Use Zofran as needed for nausea and vomiting.  Go to the ER for any uncontrolled vomiting, abdominal pain or fevers.   ED Prescriptions    Medication Sig Dispense Auth. Provider   ondansetron (ZOFRAN ODT) 4 MG disintegrating tablet Take 1 tablet (4 mg total) by mouth every 6 (six) hours as needed for nausea or vomiting. 20 tablet Evon Slack, [place]  Evon Slack, [place] 10/07/17 [phone]

## 2017-10-07 NOTE — ED Triage Notes (Signed)
Started Thursday. States it got better then it got worse again to day. Also having abd cramping and body aches

## 2017-10-08 ENCOUNTER — Encounter: Payer: Self-pay | Admitting: Radiology

## 2017-10-08 ENCOUNTER — Emergency Department: Payer: No Typology Code available for payment source

## 2017-10-08 ENCOUNTER — Emergency Department
Admission: EM | Admit: 2017-10-08 | Discharge: 2017-10-08 | Disposition: A | Discharge status: Home / Self Care | Payer: No Typology Code available for payment source | Attending: Emergency Medicine | Admitting: Emergency Medicine

## 2017-10-08 DIAGNOSIS — R197 Diarrhea, unspecified: Secondary | ICD-10-CM

## 2017-10-08 DIAGNOSIS — R112 Nausea with vomiting, unspecified: Secondary | ICD-10-CM

## 2017-10-08 MED ORDER — IOHEXOL 300 MG/ML  SOLN
100.0000 mL | Freq: Once | INTRAMUSCULAR | Status: AC | PRN
  Administered 2017-10-08: 100 mL via INTRAVENOUS

## 2017-10-08 MED ORDER — DEXTROSE-NACL 5-0.45 % IV SOLN
INTRAVENOUS | Status: DC
  Administered 2017-10-08: 01:00:00 via INTRAVENOUS

## 2017-10-08 MED ORDER — IOPAMIDOL (ISOVUE-300) INJECTION 61%
30.0000 mL | Freq: Once | INTRAVENOUS | Status: AC
  Administered 2017-10-08: 30 mL via ORAL

## 2017-10-08 MED ORDER — PROMETHAZINE HCL 25 MG PO TABS: 25.0000 mg | ORAL_TABLET | Freq: Four times a day (QID) | ORAL | 0 refills | Status: DC | PRN

## 2017-10-08 MED ORDER — PROMETHAZINE HCL 25 MG/ML IJ SOLN
12.5000 mg | Freq: Once | INTRAMUSCULAR | Status: AC
  Administered 2017-10-08: 12.5 mg via INTRAVENOUS
  Filled 2017-10-08: qty 1

## 2017-10-08 MED ORDER — FENTANYL CITRATE (PF) 100 MCG/2ML IJ SOLN
50.0000 ug | Freq: Once | INTRAMUSCULAR | Status: AC
  Administered 2017-10-08: 50 ug via INTRAVENOUS
  Filled 2017-10-08: qty 2

## 2017-10-08 NOTE — ED Notes (Signed)
Pt states she has not had to throw up since she has been here.

## 2017-10-08 NOTE — ED Provider Notes (Signed)
[facility] Emergency Department Provider Note   ____________________________________________   First MD Initiated Contact with Patient 10/08/17 0045     (approximate)  I have reviewed the triage vital signs and the nursing notes.   HISTORY  Chief Complaint Weakness and Nausea    HPI Sonya [name] is a 27 y.o. female who presents to the ED from home with a chief complaint of nausea/vomiting/diarrhea and abdominal pain.  Symptoms x2 days.  Patient was seen at urgent care earlier last evening, given 2 doses of Zofran and discharged home.  Patient states her prescription was sent to the wrong pharmacy so she has been unable to pick up her Zofran prescription.  States after she got home she continued to vomit and have diarrhea.  Denies associated fever, chills, chest pain, shortness of breath, dysuria.  Denies recent travel, trauma or antibiotic use.   Past Medical History:  Diagnosis Date  . Anxiety   . Depression   . Meniere disease UNKNOWN  . Vertigo     Patient Active Problem List   Diagnosis Date Noted  . Indication for care in labor and delivery, delivered 12/26/2014  . Indication for care or intervention related to labor and delivery 12/23/2014  . Labor and delivery, indication for care 10/13/2014    Past Surgical History:  Procedure Laterality Date  . WRIST SURGERY      Prior to Admission medications   Medication Sig Start Date End Date Taking? Authorizing Provider  Aspirin-Acetaminophen-Caffeine (GOODY HEADACHE PO) Take 1 packet by mouth once as needed (for headaches).    [provider]  Dexlansoprazole (DEXILANT) 30 MG capsule Take 30 mg by mouth daily.    [provider]  Ferrous Sulfate (IRON) 28 MG TABS Take 28 mg by mouth daily.    [provider]  FLUoxetine (PROZAC) 10 MG capsule Take 1 capsule (10 mg total) by mouth daily. Take it along with 20 mg Patient taking differently: Take 10 mg by mouth See  admin instructions. Take 10 mg by mouth once a day- in conjunction with one 20 mg capsule to equal a total dose of 30 milligrams 08/17/17   Ursula Alert, MD  FLUoxetine (PROZAC) 20 MG capsule Take 1 capsule (20 mg total) by mouth daily. Take it along with 10 mg Patient taking differently: Take 20 mg by mouth See admin instructions. Take 20 mg by mouth once a day- in conjunction with one 10 mg capsule to equal a total dose of 30 milligrams 08/17/17   Eappen, Ria Clock, MD  fluticasone (FLONASE) 50 MCG/ACT nasal spray Place 1 spray into both nostrils daily.  09/08/17   [provider]  ibuprofen (ADVIL,MOTRIN) 800 MG tablet Take 800 mg by mouth every 6 (six) hours as needed (for pain or migraines).    [provider]  Lactobacillus Rhamnosus, GG, ([employer] PROBIOTIC, LACTOBACILLUS,) CAPS Take 1 capsule by mouth daily.    [provider]  meclizine (ANTIVERT) 25 MG tablet Take 25 mg by mouth 3 (three) times daily as needed (for vertigo).  09/08/17   [provider]  Multiple Vitamins-Minerals (ONE-A-DAY WOMENS VITACRAVES) CHEW Chew 2 tablets by mouth daily.    [provider]  ondansetron (ZOFRAN ODT) 4 MG disintegrating tablet Take 1 tablet (4 mg total) by mouth every 6 (six) hours as needed for nausea or vomiting. 10/07/17   Duanne Guess, PA-C  pantoprazole (PROTONIX) 40 MG tablet Take 40 mg by mouth daily before breakfast.  [provider]  promethazine (PHENERGAN) 25 MG tablet Take 1 tablet (25 mg total) by mouth every 6 (six) hours as needed for nausea or vomiting. 10/08/17   Paulette Blanch, MD  propranolol (INDERAL) 10 MG tablet Take 1 tablet (10 mg total) by mouth 2 (two) times daily as needed. For severe anxiety sx Patient taking differently: Take 10 mg by mouth 2 (two) times daily as needed (for severe anxiety symptoms).  08/17/17   Ursula Alert, MD  traZODone (DESYREL) 100 MG tablet Take 1 tablet (100 mg total) by mouth at bedtime. 08/17/17   Ursula Alert, MD  triamterene-hydrochlorothiazide (DYAZIDE) 37.5-25 MG capsule Take 1 capsule by mouth daily. 01/18/17   [provider]  VENTOLIN HFA 108 (90 Base) MCG/ACT inhaler Inhale 2 puffs into the lungs every 6 (six) hours as needed for wheezing or shortness of breath.  09/15/17   [provider]    Allergies Adipex-p [phentermine]; Codeine; and Adhesive [tape]  Family History  Problem Relation Age of Onset  . Graves' disease Mother   . Drug abuse Father   . Drug abuse Sister   . Depression Brother     Social History Social History   Tobacco Use  . Smoking status: Former Research scientist (life sciences)  . Smokeless tobacco: Never Used  Substance Use Topics  . Alcohol use: No  . Drug use: No    Review of Systems  Constitutional: Positive for generalized weakness and malaise.  No fever/chills Eyes: No visual changes. ENT: No sore throat. Cardiovascular: Denies chest pain. Respiratory: Denies shortness of breath. Gastrointestinal: Positive for abdominal pain, vomiting, diarrhea diarrhea.  No constipation. Genitourinary: Negative for dysuria. Musculoskeletal: Negative for back pain. Skin: Negative for rash. Neurological: Negative for headaches, focal weakness or numbness.   ____________________________________________   PHYSICAL EXAM:  VITAL SIGNS: ED Triage Vitals  Enc Vitals Group     BP 10/07/17 2136 117/68     Pulse Rate 10/07/17 2136 (!) 120     Resp 10/07/17 2136 18     Temp 10/07/17 2136 99.3 F (37.4 C)     Temp Source 10/07/17 2136 Oral     SpO2 10/07/17 2136 97 %     Weight 10/07/17 2137 203 lb (92.1 kg)     Height 10/07/17 2137 '5\' 2"'$  (1.575 m)     Head Circumference --      Peak Flow --      Pain Score 10/07/17 2137 7     Pain Loc --      Pain Edu? --      Excl. in [place]? --     Constitutional: Alert and oriented. Well appearing and in mild acute distress. Eyes: Conjunctivae are normal. PERRL. EOMI. Head: Atraumatic. Nose: No  congestion/rhinnorhea. Mouth/Throat: Mucous membranes are mildly dry.  Oropharynx non-erythematous. Neck: No stridor.   Cardiovascular: Normal rate, regular rhythm. Grossly normal heart sounds.  Good peripheral circulation. Respiratory: Normal respiratory effort.  No retractions. Lungs CTAB. Gastrointestinal: Soft and mildly tender to palpation diffusely without rebound or guarding. No distention. No abdominal bruits. No CVA tenderness. Musculoskeletal: No lower extremity tenderness nor edema.  No joint effusions. Neurologic:  Normal speech and language. No gross focal neurologic deficits are appreciated. No gait instability. Skin:  Skin is warm, dry and intact. No rash noted. Psychiatric: Mood and affect are normal. Speech and behavior are normal.  ____________________________________________   LABS (all labs ordered are listed, but only abnormal results are displayed)  Labs Reviewed  COMPREHENSIVE METABOLIC PANEL -  Abnormal; Notable for the following components:      Result Value   Glucose, Bld 103 (*)    Calcium 8.8 (*)    All other components within normal limits  CBC - Abnormal; Notable for the following components:   Hemoglobin 11.8 (*)    HCT 34.5 (*)    All other components within normal limits  URINALYSIS, COMPLETE (UACMP) WITH MICROSCOPIC - Abnormal; Notable for the following components:   Color, Urine YELLOW (*)    APPearance CLEAR (*)    All other components within normal limits  C DIFFICILE QUICK SCREEN W PCR REFLEX  GASTROINTESTINAL PANEL BY PCR, STOOL (REPLACES STOOL CULTURE)  LIPASE, BLOOD  POC URINE PREG, ED   ____________________________________________  EKG  ED ECG REPORT I, Keeana Pieratt J, the attending physician, personally viewed and interpreted this ECG.   Date: 10/08/2017  EKG Time: 2135  Rate: 112  Rhythm: sinus tachycardia  Axis: Normal  Intervals:none  ST&T Change: Nonspecific  ____________________________________________  RADIOLOGY  ED  MD interpretation:  No acute abnormality  Official radiology report(s): Ct Abdomen Pelvis W Contrast  Result Date: 10/08/2017 CLINICAL DATA:  27 y/o  F; nausea, vomiting, and diarrhea. EXAM: CT ABDOMEN AND PELVIS WITH CONTRAST TECHNIQUE: Multidetector CT imaging of the abdomen and pelvis was performed using the standard protocol following bolus administration of intravenous contrast. CONTRAST:  139m OMNIPAQUE IOHEXOL 300 MG/ML  SOLN COMPARISON:  None. FINDINGS: Lower chest: No acute abnormality. Hepatobiliary: No focal liver abnormality is seen. No gallstones, gallbladder wall thickening, or biliary dilatation. Pancreas: Unremarkable. No pancreatic ductal dilatation or surrounding inflammatory changes. Spleen: Normal in size without focal abnormality. Adrenals/Urinary Tract: Adrenal glands are unremarkable. Kidneys are normal without renal calculi or focal lesion. Mild bilateral hydronephrosis. Normal bladder. Stomach/Bowel: Stomach is within normal limits. Appendix appears normal. No evidence of bowel wall thickening, distention, or inflammatory changes. Vascular/Lymphatic: No significant vascular findings are present. No enlarged abdominal or pelvic lymph nodes. Reproductive: Uterus and bilateral adnexa are unremarkable. Other: No abdominal wall hernia or abnormality. No abdominopelvic ascites. Musculoskeletal: No acute or significant osseous findings. IMPRESSION: 1. Mild hydronephrosis possibly related to vesicoureteral reflux or infection of the collecting system. No obstructing stone or mass. No inflammatory changes identified. 2. Otherwise unremarkable CT of abdomen and pelvis. Electronically Signed   By: LKristine GarbeM.D.   On: 10/08/2017 03:35    ____________________________________________   PROCEDURES  Procedure(s) performed: None  Procedures  Critical Care performed: No  ____________________________________________   INITIAL IMPRESSION / ASSESSMENT AND PLAN / ED  COURSE  As part of my medical decision making, I reviewed the following data within the [facility] obtained from family, Nursing notes reviewed and incorporated, Labs reviewed, Old chart reviewed, Radiograph reviewed and Notes from prior ED visits   27year old female who presents with nausea/vomiting/diarrhea and abdominal pain x2 days.  Persistent symptoms despite 2 doses of Zofran administered at urgent care prior to arrival.  Differential diagnosis includes but is not limited to gastroenteritis, colitis, bowel obstruction, diverticulitis, gastritis, etc.  Laboratory urinalysis results unremarkable.  Will initiate IV fluid resuscitation, 50 mcg fentanyl for pain, 12.5 mg IV Phenergan for nausea and proceed with CT abdomen/pelvis to evaluate for intra-abdominal etiology of patient's symptoms.  We will also collect stool specimens if patient can provide sample.  Clinical Course as of Oct 08 656  Sun Oct 08, 2017  0512 Patient asleep in no acute distress.  Updated her of CT scan results.  She has  not been able to provide stool sample while in the emergency department.  Will discharge home with stool collection kit, prescription for Phenergan and patient will follow-up with her PCP next week.  She tolerated oral contrast without emesis.  Strict return precautions given.  Patient verbalizes understanding agrees with plan of care.   [JS]    Clinical Course User Index [JS] Paulette Blanch, MD     ____________________________________________   FINAL CLINICAL IMPRESSION(S) / ED DIAGNOSES  Final diagnoses:  Non-intractable vomiting with nausea, unspecified vomiting type  Diarrhea, unspecified type     ED Discharge Orders        Ordered    promethazine (PHENERGAN) 25 MG tablet  Every 6 hours PRN     10/08/17 0515       Note:  This document was prepared using Dragon voice recognition software and may include unintentional dictation errors.    Paulette Blanch,  MD 10/08/17 [phone]

## 2017-10-08 NOTE — Discharge Instructions (Addendum)
1.  You may take Phenergan as needed for nausea/vomiting. 2.  Clear liquids x12 hours, then SUPERVALU INCBRAT diet x3 days, then slowly advance diet as tolerated. 3.  Return to the ER for worsening symptoms, persistent vomiting, difficulty breathing or other concerns.

## 2017-10-08 NOTE — ED Notes (Signed)
Peripheral IV discontinued. Catheter intact. No signs of infiltration or redness. Gauze applied to IV site.   Discharge instructions reviewed with patient. Questions fielded by this RN. Patient verbalizes understanding of instructions. Patient discharged home in stable condition per sung. No acute distress noted at time of discharge.    

## 2017-10-09 ENCOUNTER — Other Ambulatory Visit
Admission: RE | Admit: 2017-10-09 | Discharge: 2017-10-09 | Disposition: A | Discharge status: Home / Self Care | Payer: No Typology Code available for payment source | Source: Ambulatory Visit | Attending: Emergency Medicine | Admitting: Emergency Medicine

## 2017-10-09 ENCOUNTER — Telehealth: Payer: Self-pay | Admitting: Emergency Medicine

## 2017-10-09 DIAGNOSIS — R197 Diarrhea, unspecified: Secondary | ICD-10-CM | POA: Diagnosis not present

## 2017-10-09 LAB — GASTROINTESTINAL PANEL BY PCR, STOOL (REPLACES STOOL CULTURE)
Adenovirus F40/41: NOT DETECTED
Astrovirus: NOT DETECTED
CYCLOSPORA CAYETANENSIS: NOT DETECTED
Campylobacter species: NOT DETECTED
Cryptosporidium: NOT DETECTED
ENTAMOEBA HISTOLYTICA: NOT DETECTED
ENTEROPATHOGENIC E COLI (EPEC): DETECTED — AB
ENTEROTOXIGENIC E COLI (ETEC): NOT DETECTED
Enteroaggregative E coli (EAEC): NOT DETECTED
Giardia lamblia: NOT DETECTED
NOROVIRUS GI/GII: NOT DETECTED
Plesimonas shigelloides: NOT DETECTED
Rotavirus A: NOT DETECTED
SAPOVIRUS (I, II, IV, AND V): NOT DETECTED
SHIGA LIKE TOXIN PRODUCING E COLI (STEC): NOT DETECTED
SHIGELLA/ENTEROINVASIVE E COLI (EIEC): NOT DETECTED
Salmonella species: NOT DETECTED
VIBRIO CHOLERAE: NOT DETECTED
VIBRIO SPECIES: NOT DETECTED
Yersinia enterocolitica: NOT DETECTED

## 2017-10-09 LAB — C DIFFICILE QUICK SCREEN W PCR REFLEX
C DIFFICLE (CDIFF) ANTIGEN: NEGATIVE
C Diff interpretation: NOT DETECTED
C Diff toxin: NEGATIVE

## 2017-10-09 LAB — LACTOFERRIN, FECAL, QUALITATIVE: LACTOFERRIN, FECAL, QUAL: POSITIVE — AB

## 2017-10-09 MED FILL — AZITHROMYCIN 500 MG TABLET: 500 | 1 days supply | Qty: 2 | Fill #0

## 2017-10-09 NOTE — Telephone Encounter (Signed)
Called patient to inform of stool result.  Per dr Ulyess Blossomwilliams--will need to treat with azithromycin 1 gram x 1 dose if she still has diarrhea.  Patient says she does have diarrhea still, so med was called in to  outpatient pharmacy.

## 2017-10-10 ENCOUNTER — Ambulatory Visit: Payer: No Typology Code available for payment source | Admitting: Psychiatry

## 2017-10-12 ENCOUNTER — Ambulatory Visit: Payer: No Typology Code available for payment source | Admitting: Psychiatry

## 2017-10-12 ENCOUNTER — Ambulatory Visit: Payer: No Typology Code available for payment source | Admitting: Licensed Clinical Social Worker

## 2017-10-13 ENCOUNTER — Encounter: Payer: Self-pay | Admitting: Emergency Medicine

## 2017-10-13 ENCOUNTER — Other Ambulatory Visit: Payer: Self-pay

## 2017-10-13 ENCOUNTER — Emergency Department
Admission: EM | Admit: 2017-10-13 | Discharge: 2017-10-13 | Disposition: A | Discharge status: Home / Self Care | Payer: No Typology Code available for payment source | Attending: Emergency Medicine | Admitting: Emergency Medicine

## 2017-10-13 ENCOUNTER — Ambulatory Visit: Payer: No Typology Code available for payment source | Admitting: Physical Therapy

## 2017-10-13 ENCOUNTER — Emergency Department: Payer: No Typology Code available for payment source

## 2017-10-13 DIAGNOSIS — R42 Dizziness and giddiness: Secondary | ICD-10-CM | POA: Insufficient documentation

## 2017-10-13 DIAGNOSIS — Z79899 Other long term (current) drug therapy: Secondary | ICD-10-CM | POA: Diagnosis not present

## 2017-10-13 DIAGNOSIS — E860 Dehydration: Secondary | ICD-10-CM | POA: Insufficient documentation

## 2017-10-13 DIAGNOSIS — A09 Infectious gastroenteritis and colitis, unspecified: Secondary | ICD-10-CM | POA: Insufficient documentation

## 2017-10-13 DIAGNOSIS — R197 Diarrhea, unspecified: Secondary | ICD-10-CM | POA: Diagnosis present

## 2017-10-13 DIAGNOSIS — Z87891 Personal history of nicotine dependence: Secondary | ICD-10-CM | POA: Insufficient documentation

## 2017-10-13 LAB — COMPREHENSIVE METABOLIC PANEL
ALBUMIN: 4 g/dL (ref 3.5–5.0)
ALT: 34 U/L (ref 0–44)
ANION GAP: 6 (ref 5–15)
AST: 29 U/L (ref 15–41)
Alkaline Phosphatase: 63 U/L (ref 38–126)
BILIRUBIN TOTAL: 0.4 mg/dL (ref 0.3–1.2)
BUN: 12 mg/dL (ref 6–20)
CHLORIDE: 101 mmol/L (ref 98–111)
CO2: 27 mmol/L (ref 22–32)
Calcium: 9.3 mg/dL (ref 8.9–10.3)
Creatinine, Ser: 0.45 mg/dL (ref 0.44–1.00)
GFR calc Af Amer: >60 mL/min (ref >60)
GFR calc non Af Amer: >60 mL/min (ref >60)
GLUCOSE: 118 mg/dL — AB (ref 70–99)
POTASSIUM: 3.3 mmol/L — AB (ref 3.5–5.1)
SODIUM: 134 mmol/L — AB (ref 135–145)
TOTAL PROTEIN: 7.7 g/dL (ref 6.5–8.1)

## 2017-10-13 LAB — CBC
HEMATOCRIT: 34.7 % — AB (ref 35.0–47.0)
Hemoglobin: 11.7 g/dL — ABNORMAL LOW (ref 12.0–16.0)
MCH: 28 pg (ref 26.0–34.0)
MCHC: 33.8 g/dL (ref 32.0–36.0)
MCV: 83 fL (ref 80.0–100.0)
Platelets: 277 10*3/uL (ref 150–440)
RBC: 4.18 MIL/uL (ref 3.80–5.20)
RDW: 13.6 % (ref 11.5–14.5)
WBC: 12.4 10*3/uL — AB (ref 3.6–11.0)

## 2017-10-13 MED ORDER — SODIUM CHLORIDE 0.9 % IV BOLUS
1000.0000 mL | Freq: Once | INTRAVENOUS | Status: AC
  Administered 2017-10-13: 1000 mL via INTRAVENOUS

## 2017-10-13 NOTE — ED Triage Notes (Addendum)
.  Patient ambulatory to triage with steady gait, without difficulty or distress noted; pt looks to her SO to answer questions for her; encouraged pt to explain why she is here; st that she has been having diarrhea and recently dx with e coli; denies pain at present; pt has been seen recently for same

## 2017-10-13 NOTE — ED Provider Notes (Signed)
[facility] Emergency Department Provider Note   First MD Initiated Contact with Patient 10/13/17 0045     (approximate)  I have reviewed the triage vital signs and the nursing notes.   HISTORY  Chief Complaint Diarrhea    HPI Natalye Passon is a 27 y.o. female with recently diagnosed enteropathogenic E. coli infectious diarrhea presents to the emergency department with dizziness, diarrhea, confusion with word finding difficulty.  Per the patient's husband patient called him and was only able to speak in one word phrases.  Patient denies any weakness numbness gait instability or visual changes.  Patient states that her diarrhea initially improved however came back 2 days ago.  Patient states she had 2 diarrheal episodes today.  Patient denies any headache at present.   Past Medical History:  Diagnosis Date  . Anxiety   . Depression   . Meniere disease UNKNOWN  . Vertigo     Patient Active Problem List   Diagnosis Date Noted  . Indication for care in labor and delivery, delivered 12/26/2014  . Indication for care or intervention related to labor and delivery 12/23/2014  . Labor and delivery, indication for care 10/13/2014    Past Surgical History:  Procedure Laterality Date  . WRIST SURGERY      Prior to Admission medications   Medication Sig Start Date End Date Taking? Authorizing Provider  Aspirin-Acetaminophen-Caffeine (GOODY HEADACHE PO) Take 1 packet by mouth once as needed (for headaches).    [provider]  Dexlansoprazole (DEXILANT) 30 MG capsule Take 30 mg by mouth daily.    [provider]  Ferrous Sulfate (IRON) 28 MG TABS Take 28 mg by mouth daily.    [provider]  FLUoxetine (PROZAC) 10 MG capsule Take 1 capsule (10 mg total) by mouth daily. Take it along with 20 mg Patient taking differently: Take 10 mg by mouth See admin instructions. Take 10 mg by mouth once a day- in conjunction with one 20 mg capsule  to equal a total dose of 30 milligrams 08/17/17   Jomarie Longs, MD  FLUoxetine (PROZAC) 20 MG capsule Take 1 capsule (20 mg total) by mouth daily. Take it along with 10 mg Patient taking differently: Take 20 mg by mouth See admin instructions. Take 20 mg by mouth once a day- in conjunction with one 10 mg capsule to equal a total dose of 30 milligrams 08/17/17   Eappen, Levin Bacon, MD  fluticasone (FLONASE) 50 MCG/ACT nasal spray Place 1 spray into both nostrils daily.  09/08/17   [provider]  ibuprofen (ADVIL,MOTRIN) 800 MG tablet Take 800 mg by mouth every 6 (six) hours as needed (for pain or migraines).    [provider]  Lactobacillus Rhamnosus, GG, ([employer] PROBIOTIC, LACTOBACILLUS,) CAPS Take 1 capsule by mouth daily.    [provider]  meclizine (ANTIVERT) 25 MG tablet Take 25 mg by mouth 3 (three) times daily as needed (for vertigo).  09/08/17   [provider]  Multiple Vitamins-Minerals (ONE-A-DAY WOMENS VITACRAVES) CHEW Chew 2 tablets by mouth daily.    [provider]  ondansetron (ZOFRAN ODT) 4 MG disintegrating tablet Take 1 tablet (4 mg total) by mouth every 6 (six) hours as needed for nausea or vomiting. 10/07/17   Evon Slack, PA-C  pantoprazole (PROTONIX) 40 MG tablet Take 40 mg by mouth daily before breakfast.     [provider]  promethazine (PHENERGAN) 25 MG tablet Take 1 tablet (25 mg total) by mouth  every 6 (six) hours as needed for nausea or vomiting. 10/08/17   Irean Hong, MD  propranolol (INDERAL) 10 MG tablet Take 1 tablet (10 mg total) by mouth 2 (two) times daily as needed. For severe anxiety sx Patient taking differently: Take 10 mg by mouth 2 (two) times daily as needed (for severe anxiety symptoms).  08/17/17   Jomarie Longs, MD  traZODone (DESYREL) 100 MG tablet Take 1 tablet (100 mg total) by mouth at bedtime. 08/17/17   Jomarie Longs, MD  triamterene-hydrochlorothiazide (DYAZIDE) 37.5-25 MG capsule Take 1 capsule  by mouth daily. 01/18/17   [provider]  VENTOLIN HFA 108 (90 Base) MCG/ACT inhaler Inhale 2 puffs into the lungs every 6 (six) hours as needed for wheezing or shortness of breath.  09/15/17   [provider]    Allergies Adipex-p [phentermine]; Codeine; and Adhesive [tape]  Family History  Problem Relation Age of Onset  . Graves' disease Mother   . Drug abuse Father   . Drug abuse Sister   . Depression Brother     Social History Social History   Tobacco Use  . Smoking status: Former Games developer  . Smokeless tobacco: Never Used  Substance Use Topics  . Alcohol use: No  . Drug use: No    Review of Systems Constitutional: No fever/chills Eyes: No visual changes. ENT: No sore throat. Cardiovascular: Denies chest pain. Respiratory: Denies shortness of breath. Gastrointestinal: No abdominal pain.  No nausea, no vomiting.  Positive for diarrhea.  No constipation. Genitourinary: Negative for dysuria. Musculoskeletal: Negative for neck pain.  Negative for back pain. Integumentary: Negative for rash. Neurological: Negative for headaches, focal weakness or numbness.  Positive for dizziness and word finding difficulty   ____________________________________________   PHYSICAL EXAM:  VITAL SIGNS: ED Triage Vitals  Enc Vitals Group     BP 10/13/17 0036 124/76     Pulse Rate 10/13/17 0036 97     Resp 10/13/17 0036 18     Temp 10/13/17 0036 97.7 F (36.5 C)     Temp Source 10/13/17 0036 Oral     SpO2 10/13/17 0036 99 %     Weight 10/13/17 0035 90.7 kg (200 lb)     Height 10/13/17 0035 1.575 m (5\' 2" )     Head Circumference --      Peak Flow --      Pain Score 10/13/17 0038 0     Pain Loc --      Pain Edu? --      Excl. in GC? --     Constitutional: Alert and oriented. Well appearing and in no acute distress. Eyes: Conjunctivae are normal. PERRL. EOMI. Head: Atraumatic. Mouth/Throat: Mucous membranes are moist.  Oropharynx non-erythematous. Neck: No  stridor. Cardiovascular: Normal rate, regular rhythm. Good peripheral circulation. Grossly normal heart sounds. Respiratory: Normal respiratory effort.  No retractions. Lungs CTAB. Gastrointestinal: Soft and nontender. No distention.  Musculoskeletal: No lower extremity tenderness nor edema. No gross deformities of extremities. Neurologic:  Normal speech and language. No gross focal neurologic deficits are appreciated.  Skin:  Skin is warm, dry and intact. No rash noted. Psychiatric: Mood and affect are normal. Speech and behavior are normal.  ____________________________________________   LABS (all labs ordered are listed, but only abnormal results are displayed)  Labs Reviewed  CBC - Abnormal; Notable for the following components:      Result Value   WBC 12.4 (*)    Hemoglobin 11.7 (*)    HCT 34.7 (*)  All other components within normal limits  COMPREHENSIVE METABOLIC PANEL - Abnormal; Notable for the following components:   Sodium 134 (*)    Potassium 3.3 (*)    Glucose, Bld 118 (*)    All other components within normal limits   _  RADIOLOGY I, Robinson N Erminio Nygard, personally viewed and evaluated these images (plain radiographs) as part of my medical decision making, as well as reviewing the written report by the radiologist.  ED MD interpretation: No acute intracranial findings noted on CT of the head  Official radiology report(s): Ct Head Wo Contrast  Result Date: 10/13/2017 CLINICAL DATA:  Acute onset of dizziness and tachycardia. Disorientation. EXAM: CT HEAD WITHOUT CONTRAST TECHNIQUE: Contiguous axial images were obtained from the base of the skull through the vertex without intravenous contrast. COMPARISON:  CT of the head performed 06/05/2013 FINDINGS: Brain: No evidence of acute infarction, hemorrhage, hydrocephalus, extra-axial collection or mass lesion/mass effect. The posterior fossa, including the cerebellum, brainstem and fourth ventricle, is within normal limits.  The third and lateral ventricles, and basal ganglia are unremarkable in appearance. The cerebral hemispheres are symmetric in appearance, with normal gray-white differentiation. No mass effect or midline shift is seen. Vascular: No hyperdense vessel or unexpected calcification. Skull: There is no evidence of fracture; visualized osseous structures are unremarkable in appearance. Sinuses/Orbits: The visualized portions of the orbits are within normal limits. The paranasal sinuses and mastoid air cells are well-aerated. Other: No significant soft tissue abnormalities are seen. IMPRESSION: Unremarkable noncontrast CT of the head. Electronically Signed   By: Roanna RaiderJeffery  Chang M.D.   On: 10/13/2017 02:19      Procedures   ____________________________________________   INITIAL IMPRESSION / ASSESSMENT AND PLAN / ED COURSE  As part of my medical decision making, I reviewed the following data within the electronic MEDICAL RECORD NUMBER   27 year old female presenting with above-stated history and physical exam.  Given patient's word finding difficulty consider the possibility of a CVA however as such CT scan of the head was performed which was unremarkable.  In addition patient speech normal during my interview without any word finding difficulty on reevaluation patient alert oriented no focal neurological deficit with clear speech.  Suspect patient's dizziness may be secondary to dehydration and as such patient received 1 L IV normal saline while in the emergency department.  I spoke with the patient at length regarding need to follow-up with primary care provider    ____________________________________________  FINAL CLINICAL IMPRESSION(S) / ED DIAGNOSES  Final diagnoses:  Diarrhea of infectious origin  Dehydration     MEDICATIONS GIVEN DURING THIS VISIT:  Medications  sodium chloride 0.9 % bolus 1,000 mL (0 mLs Intravenous Stopped 10/13/17 0341)  sodium chloride 0.9 % bolus 1,000 mL (0 mLs  Intravenous Stopped 10/13/17 0313)     ED Discharge Orders    None       Note:  This document was prepared using Dragon voice recognition software and may include unintentional dictation errors.    Darci CurrentBrown, Glendora N, MD 10/13/17 [phone]

## 2017-10-13 NOTE — ED Notes (Signed)
Pt is back from medical imaging. 

## 2017-10-13 NOTE — ED Notes (Signed)
Pt went to medical imaging.   

## 2017-10-13 NOTE — ED Notes (Signed)
Pt has had diarrhea for the past 8 days. Spouse stated that tonight pt called him and she was unable to form sentences and appeared to be disoriented. Pt stated that she was dizzy and had a elevated heart rate. Pt stated that all of this happened after she left work around [employer] last night. Pt stated that she called a advice nurse and she told her her s/s and they informed her to come to the ED.  Pt stated that she feels like it is getting better. Pt stated that the diarrhea has got better.

## 2017-10-16 ENCOUNTER — Encounter: Payer: Self-pay | Admitting: Physical Therapy

## 2017-10-16 ENCOUNTER — Ambulatory Visit: Payer: No Typology Code available for payment source | Admitting: Psychiatry

## 2017-10-18 ENCOUNTER — Encounter: Payer: Self-pay | Admitting: Physical Therapy

## 2017-10-23 ENCOUNTER — Encounter: Payer: Self-pay | Admitting: Physical Therapy

## 2017-10-24 ENCOUNTER — Encounter: Payer: Self-pay | Admitting: Physical Therapy

## 2017-10-25 ENCOUNTER — Other Ambulatory Visit: Payer: Self-pay

## 2017-10-25 ENCOUNTER — Encounter: Payer: Self-pay | Admitting: *Deleted

## 2017-10-25 ENCOUNTER — Ambulatory Visit
Admission: RE | Admit: 2017-10-25 | Discharge: 2017-10-25 | Disposition: A | Discharge status: Home / Self Care | Payer: No Typology Code available for payment source | Source: Ambulatory Visit | Attending: Internal Medicine | Admitting: Internal Medicine

## 2017-10-25 ENCOUNTER — Ambulatory Visit: Payer: No Typology Code available for payment source | Admitting: Anesthesiology

## 2017-10-25 ENCOUNTER — Encounter
Admission: RE | Disposition: A | Discharge status: Home / Self Care | Payer: Self-pay | Source: Ambulatory Visit | Attending: Internal Medicine

## 2017-10-25 DIAGNOSIS — Z87891 Personal history of nicotine dependence: Secondary | ICD-10-CM | POA: Diagnosis not present

## 2017-10-25 DIAGNOSIS — R194 Change in bowel habit: Secondary | ICD-10-CM | POA: Insufficient documentation

## 2017-10-25 DIAGNOSIS — K219 Gastro-esophageal reflux disease without esophagitis: Secondary | ICD-10-CM | POA: Diagnosis not present

## 2017-10-25 DIAGNOSIS — K2950 Unspecified chronic gastritis without bleeding: Secondary | ICD-10-CM | POA: Insufficient documentation

## 2017-10-25 DIAGNOSIS — R112 Nausea with vomiting, unspecified: Secondary | ICD-10-CM | POA: Diagnosis not present

## 2017-10-25 DIAGNOSIS — R159 Full incontinence of feces: Secondary | ICD-10-CM | POA: Insufficient documentation

## 2017-10-25 DIAGNOSIS — H8109 Meniere's disease, unspecified ear: Secondary | ICD-10-CM | POA: Insufficient documentation

## 2017-10-25 DIAGNOSIS — Z79899 Other long term (current) drug therapy: Secondary | ICD-10-CM | POA: Diagnosis not present

## 2017-10-25 DIAGNOSIS — K6389 Other specified diseases of intestine: Secondary | ICD-10-CM | POA: Insufficient documentation

## 2017-10-25 DIAGNOSIS — F329 Major depressive disorder, single episode, unspecified: Secondary | ICD-10-CM | POA: Diagnosis not present

## 2017-10-25 DIAGNOSIS — Z7951 Long term (current) use of inhaled steroids: Secondary | ICD-10-CM | POA: Insufficient documentation

## 2017-10-25 HISTORY — PX: ESOPHAGOGASTRODUODENOSCOPY (EGD) WITH PROPOFOL: SHX5813

## 2017-10-25 HISTORY — PX: COLONOSCOPY WITH PROPOFOL: SHX5780

## 2017-10-25 SURGERY — ESOPHAGOGASTRODUODENOSCOPY (EGD) WITH PROPOFOL
Anesthesia: General

## 2017-10-25 MED ORDER — PROPOFOL 500 MG/50ML IV EMUL
INTRAVENOUS | Status: DC | PRN
  Administered 2017-10-25: 140 ug/kg/min via INTRAVENOUS

## 2017-10-25 MED ORDER — LIDOCAINE HCL (CARDIAC) PF 100 MG/5ML IV SOSY
PREFILLED_SYRINGE | INTRAVENOUS | Status: DC | PRN
  Administered 2017-10-25: 100 mg via INTRAVENOUS

## 2017-10-25 MED ORDER — SODIUM CHLORIDE 0.9 % IV SOLN
INTRAVENOUS | Status: DC
  Administered 2017-10-25: 13:00:00 via INTRAVENOUS

## 2017-10-25 MED ORDER — PROPOFOL 500 MG/50ML IV EMUL
INTRAVENOUS | Status: AC
  Filled 2017-10-25: qty 50

## 2017-10-25 MED ORDER — LIDOCAINE HCL (PF) 2 % IJ SOLN
INTRAMUSCULAR | Status: AC
  Filled 2017-10-25: qty 10

## 2017-10-25 MED ORDER — PROPOFOL 10 MG/ML IV BOLUS
INTRAVENOUS | Status: DC | PRN
  Administered 2017-10-25: 40 mg via INTRAVENOUS
  Administered 2017-10-25: 100 mg via INTRAVENOUS
  Administered 2017-10-25: 60 mg via INTRAVENOUS

## 2017-10-25 NOTE — Interval H&P Note (Signed)
History and Physical Interval Note:  10/25/2017 1:10 PM  Sonya [name]  has presented today for surgery, with the diagnosis of GERD  The various methods of treatment have been discussed with the patient and family. After consideration of risks, benefits and other options for treatment, the patient has consented to  Procedure(s): ESOPHAGOGASTRODUODENOSCOPY (EGD) WITH PROPOFOL (N/A) COLONOSCOPY WITH PROPOFOL (N/A) as a surgical intervention .  The patient's history has been reviewed, patient examined, no change in status, stable for surgery.  I have reviewed the patient's chart and labs.  Questions were answered to the patient's satisfaction.     [place], [place]oro

## 2017-10-25 NOTE — H&P (Signed)
Outpatient short stay form Pre-procedure 10/25/2017 1:08 PM Niv Darley K. Norma Fredricksonoledo, M.D.  Primary Physician: Leotis ShamesJasmine Singh, M.D.  Reason for visit:  GERD, nausea, fecal incontinence  History of present illness: 27 y/o female presents for EGD and colonoscopy to evaluate above symptoms. Patient apparently has ssevere GERD symptoms not responding to Protonix 40mg  daily. Patient reportedly suffered a fourth degree perineal tear and is s/p episiotomy. She has some fecal incontinence symptoms.     Current Facility-Administered Medications:  .  0.9 %  sodium chloride infusion, , Intravenous, Continuous, [place], Boykin Nearingeodoro K, MD, Last Rate: 20 mL/hr at 10/25/17 1300  Medications Prior to Admission  Medication Sig Dispense Refill Last Dose  . Aspirin-Acetaminophen-Caffeine (GOODY HEADACHE PO) Take 1 packet by mouth once as needed (for headaches).   Past Month at Unknown time  . Dexlansoprazole (DEXILANT) 30 MG capsule Take 30 mg by mouth daily.   Past Week at Unknown time  . Ferrous Sulfate (IRON) 28 MG TABS Take 28 mg by mouth daily.   Past Week at Unknown time  . FLUoxetine (PROZAC) 10 MG capsule Take 1 capsule (10 mg total) by mouth daily. Take it along with 20 mg (Patient taking differently: Take 10 mg by mouth See admin instructions. Take 10 mg by mouth once a day- in conjunction with one 20 mg capsule to equal a total dose of 30 milligrams) 90 capsule 0 Past Week at Unknown time  . FLUoxetine (PROZAC) 20 MG capsule Take 1 capsule (20 mg total) by mouth daily. Take it along with 10 mg (Patient taking differently: Take 20 mg by mouth See admin instructions. Take 20 mg by mouth once a day- in conjunction with one 10 mg capsule to equal a total dose of 30 milligrams) 90 capsule 0 Past Week at Unknown time  . fluticasone (FLONASE) 50 MCG/ACT nasal spray Place 1 spray into both nostrils daily.   12 Past Week at Unknown time  . ibuprofen (ADVIL,MOTRIN) 800 MG tablet Take 800 mg by mouth every 6 (six) hours as  needed (for pain or migraines).   Past Week at Unknown time  . Lactobacillus Rhamnosus, GG, ([employer] PROBIOTIC, LACTOBACILLUS,) CAPS Take 1 capsule by mouth daily.   Past Month at Unknown time  . meclizine (ANTIVERT) 25 MG tablet Take 25 mg by mouth 3 (three) times daily as needed (for vertigo).   0 Past Month at Unknown time  . Multiple Vitamins-Minerals (ONE-A-DAY WOMENS VITACRAVES) CHEW Chew 2 tablets by mouth daily.   10/24/2017 at 0900  . ondansetron (ZOFRAN ODT) 4 MG disintegrating tablet Take 1 tablet (4 mg total) by mouth every 6 (six) hours as needed for nausea or vomiting. 20 tablet 0 Past Month at Unknown time  . propranolol (INDERAL) 10 MG tablet Take 1 tablet (10 mg total) by mouth 2 (two) times daily as needed. For severe anxiety sx (Patient taking differently: Take 10 mg by mouth 2 (two) times daily as needed (for severe anxiety symptoms). ) 180 tablet 0 Past Month at Unknown time  . traZODone (DESYREL) 100 MG tablet Take 1 tablet (100 mg total) by mouth at bedtime. 90 tablet 0 Past Week at Unknown time  . triamterene-hydrochlorothiazide (DYAZIDE) 37.5-25 MG capsule Take 1 capsule by mouth daily.  5 Past Week at Unknown time  . VENTOLIN HFA 108 (90 Base) MCG/ACT inhaler Inhale 2 puffs into the lungs every 6 (six) hours as needed for wheezing or shortness of breath.    Past Month at Unknown time  . pantoprazole (  PROTONIX) 40 MG tablet Take 40 mg by mouth daily before breakfast.    Not Taking at Unknown time  . promethazine (PHENERGAN) 25 MG tablet Take 1 tablet (25 mg total) by mouth every 6 (six) hours as needed for nausea or vomiting. (Patient not taking: Reported on 10/25/2017) 20 tablet 0 Not Taking at Unknown time     Allergies  Allergen Reactions  . Adipex-P [Phentermine] Shortness Of Breath, Swelling, Palpitations and Hypertension    Tunnel vision, chest pain, headache, and feet became swollen  . Codeine Nausea And Vomiting  . Adhesive [Tape] Rash    Patient prefers paper tape      Past Medical History:  Diagnosis Date  . Anxiety   . Depression   . Meniere disease UNKNOWN  . Vertigo     Review of systems:  Otherwise negative.    Physical Exam  Gen: Alert, oriented. Appears stated age.  HEENT: [place]/AT. PERRLA. Lungs: CTA, no wheezes. CV: RR nl S1, S2. Abd: soft, benign, no masses. BS+ Ext: No edema. Pulses 2+    Planned procedures: Proceed with EGD and colonoscopy. The patient understands the nature of the planned procedure, indications, risks, alternatives and potential complications including but not limited to bleeding, infection, perforation, damage to internal organs and possible oversedation/side effects from anesthesia. The patient agrees and gives consent to proceed.  Please refer to procedure notes for findings, recommendations and patient disposition/instructions.     Talea Manges K. Norma Fredricksonoledo, M.D. Gastroenterology 10/25/2017  1:08 PM

## 2017-10-25 NOTE — Op Note (Signed)
[facility] Gastroenterology Patient Name: Sonya [name] Procedure Date: 10/25/2017 1:06 PM MRN: [id number] Account #: [id number] Date of Birth: 10/12/1990 Admit Type: Outpatient Age: [age] [facility] END[facility] ROOM 4 Gender: Female Note Status: Finalized Procedure:            Upper GI endoscopy Indications:          Esophageal reflux symptoms that persist despite                        appropriate therapy, Nausea with vomiting Providers:            Boykin Nearingeodoro K. Dorsel Flinn MD, MD Medicines:            Propofol per Anesthesia Complications:        No immediate complications. Procedure:            Pre-Anesthesia Assessment:                       - The risks and benefits of the procedure and the                        sedation options and risks were discussed with the                        patient. All questions were answered and informed                        consent was obtained.                       - Patient identification and proposed procedure were                        verified prior to the procedure by the nurse. The                        procedure was verified in the procedure room.                       - ASA Grade Assessment: II - A patient with mild                        systemic disease.                       - After reviewing the risks and benefits, the patient                        was deemed in satisfactory condition to undergo the                        procedure.                       After obtaining informed consent, the endoscope was                        passed under direct vision. Throughout the procedure,                        the patient's blood pressure, pulse, and oxygen  saturations were monitored continuously. The Endoscope                        was introduced through the mouth, and advanced to the                        third part of duodenum. The upper GI endoscopy was                        accomplished without  difficulty. The patient tolerated                        the procedure well. Findings:      The examined esophagus was normal.      Patchy mildly erythematous mucosa without bleeding was found in the       gastric antrum. Biopsies were taken with a cold forceps for Helicobacter       pylori testing.      The cardia and gastric fundus were normal on retroflexion.      The examined duodenum was normal. Impression:           - Normal esophagus.                       - Erythematous mucosa in the antrum. Biopsied.                       - Normal examined duodenum. Recommendation:       - Await pathology results.                       - Proceed with colonoscopy Procedure Code(s):    --- Professional ---                       [phone], Esophagogastroduodenoscopy, flexible, transoral;                        with biopsy, single or multiple Diagnosis Code(s):    --- Professional ---                       R11.2, Nausea with vomiting, unspecified                       K21.9, Gastro-esophageal reflux disease without                        esophagitis                       K31.89, Other diseases of stomach and duodenum CPT copyright 2017 American Medical Association. All rights reserved. The codes documented in this report are preliminary and upon coder review may  be revised to meet current compliance requirements. Stanton Kidneyeodoro K Marylen Zuk MD, MD 10/25/2017 1:31:13 PM This report has been signed electronically. Number of Addenda: 0 Note Initiated On: 10/25/2017 1:06 PM      [facility]

## 2017-10-25 NOTE — Anesthesia Procedure Notes (Signed)
Performed by: Aya Geisel, CRNA Pre-anesthesia Checklist: Patient identified, Emergency Drugs available, Suction available, Patient being monitored and Timeout performed Patient Re-evaluated:Patient Re-evaluated prior to induction Oxygen Delivery Method: Nasal cannula Induction Type: IV induction       

## 2017-10-25 NOTE — Transfer of Care (Signed)
Immediate Anesthesia Transfer of Care Note  Patient: Sonya [name]  Procedure(s) Performed: ESOPHAGOGASTRODUODENOSCOPY (EGD) WITH PROPOFOL (N/A ) COLONOSCOPY WITH PROPOFOL (N/A )  Patient Location: PACU  Anesthesia Type:General  Level of Consciousness: sedated and responds to stimulation  Airway & Oxygen Therapy: Patient Spontanous Breathing and Patient connected to nasal cannula oxygen  Post-op Assessment: Report given to RN and Post -op Vital signs reviewed and stable  Post vital signs: Reviewed and stable  Last Vitals:  Vitals Value Taken Time  BP 108/65 10/25/2017  1:47 PM  Temp    Pulse 76 10/25/2017  1:47 PM  Resp 8 10/25/2017  1:47 PM  SpO2 100 % 10/25/2017  1:47 PM    Last Pain:  Vitals:   10/25/17 1243  TempSrc: Tympanic  PainSc: 0-No pain         Complications: No apparent anesthesia complications

## 2017-10-25 NOTE — Anesthesia Preprocedure Evaluation (Addendum)
Anesthesia Evaluation  Patient identified by MRN, date of birth, ID band Patient awake    Reviewed: Allergy & Precautions, H&P , NPO status , Patient's Chart, lab work & pertinent test results  Airway Mallampati: II  TM Distance: >3 FB Neck ROM: full    Dental no notable dental hx. (+) Teeth Intact   Pulmonary neg pulmonary ROS, former smoker,           Cardiovascular negative cardio ROS       Neuro/Psych PSYCHIATRIC DISORDERS Anxiety Depression negative neurological ROS  negative psych ROS   GI/Hepatic negative GI ROS, Neg liver ROS,   Endo/Other  negative endocrine ROS  Renal/GU negative Renal ROS  negative genitourinary   Musculoskeletal   Abdominal   Peds  Hematology negative hematology ROS (+)   Anesthesia Other Findings EGD due to "severe acid reflux," colonoscopy to evaluate anal leakage following 4th degree repair after childbirth.   Past Medical History: No date: Anxiety No date: Depression UNKNOWN: Meniere disease No date: Vertigo  Past Surgical History: No date: WRIST SURGERY     Reproductive/Obstetrics negative OB ROS                            Anesthesia Physical Anesthesia Plan  ASA: I  Anesthesia Plan: General   Post-op Pain Management:    Induction:   PONV Risk Score and Plan: Propofol infusion  Airway Management Planned:   Additional Equipment:   Intra-op Plan:   Post-operative Plan:   Informed Consent: I have reviewed the patients History and Physical, chart, labs and discussed the procedure including the risks, benefits and alternatives for the proposed anesthesia with the patient or authorized representative who has indicated his/her understanding and acceptance.   Dental Advisory Given  Plan Discussed with: Anesthesiologist, CRNA and Surgeon  Anesthesia Plan Comments:        Anesthesia Quick Evaluation

## 2017-10-25 NOTE — Anesthesia Post-op Follow-up Note (Signed)
Anesthesia QCDR form completed.        

## 2017-10-25 NOTE — Anesthesia Postprocedure Evaluation (Signed)
Anesthesia Post Note  Patient: Sonya [name]  Procedure(s) Performed: ESOPHAGOGASTRODUODENOSCOPY (EGD) WITH PROPOFOL (N/A ) COLONOSCOPY WITH PROPOFOL (N/A )  Patient location during evaluation: PACU Anesthesia Type: General Level of consciousness: awake and alert Pain management: pain level controlled Vital Signs Assessment: post-procedure vital signs reviewed and stable Respiratory status: spontaneous breathing, nonlabored ventilation and respiratory function stable Cardiovascular status: blood pressure returned to baseline and stable Postop Assessment: no apparent nausea or vomiting Anesthetic complications: no     Last Vitals:  Vitals:   10/25/17 1405 10/25/17 1415  BP: 102/70 100/64  Pulse: 79   Resp: 19   Temp:    SpO2: 98%     Last Pain:  Vitals:   10/25/17 1405  TempSrc:   PainSc: 0-No pain                 [name]

## 2017-10-25 NOTE — Op Note (Signed)
[facility] Gastroenterology Patient Name: Amory Zbikowski Procedure Date: 10/25/2017 1:06 PM MRN: [id number] Account #: [id number] Date of Birth: May 15, 1990 Admit Type: Outpatient Age: 27 Room: [facility] ENDO ROOM 4 Gender: Female Note Status: Finalized Procedure:            Colonoscopy Indications:          Change in bowel habits, Fecal incontinence Providers:            Boykin Nearing. Victor Granados MD, MD Medicines:            Propofol per Anesthesia Complications:        No immediate complications. Procedure:            Pre-Anesthesia Assessment:                       - The risks and benefits of the procedure and the                        sedation options and risks were discussed with the                        patient. All questions were answered and informed                        consent was obtained.                       - Patient identification and proposed procedure were                        verified prior to the procedure by the nurse. The                        procedure was verified in the procedure room.                       - ASA Grade Assessment: II - A patient with mild                        systemic disease.                       - After reviewing the risks and benefits, the patient                        was deemed in satisfactory condition to undergo the                        procedure.                       After obtaining informed consent, the colonoscope was                        passed under direct vision. Throughout the procedure,                        the patient's blood pressure, pulse, and oxygen                        saturations were monitored continuously. The  Colonoscope was introduced through the anus and                        advanced to the the terminal ileum, with identification                        of the appendiceal orifice and IC valve. The                        colonoscopy was performed without difficulty.  The                        patient tolerated the procedure well. The quality of                        the bowel preparation was excellent. Findings:      The digital rectal exam findings include decreased sphincter tone.       Pertinent negatives include no palpable rectal lesions.      The terminal ileum appeared normal. Biopsies were taken with a cold       forceps for histology.      The colon (entire examined portion) appeared normal. Biopsies were taken       with a cold forceps for histology. Several biopsies were obtained in the       rectum, in the sigmoid colon, in the descending colon and in the       ascending colon with cold forceps for diagnostic purposes.      The exam was otherwise without abnormality. Impression:           - Decreased sphincter tone found on digital rectal exam.                       - The examined portion of the ileum was normal.                        Biopsied.                       - The entire examined colon is normal. Biopsied.                       - The examination was otherwise normal.                       - Several biopsies were obtained in the rectum, in the                        sigmoid colon, in the descending colon and in the                        ascending colon. Recommendation:       - Patient has a contact number available for                        emergencies. The signs and symptoms of potential                        delayed complications were discussed with the patient.  Return to normal activities tomorrow. Written discharge                        instructions were provided to the patient.                       - Await pathology results from EGD, also performed                        today.                       - Return to physician assistant in 3 months.                       - Proceed with physical therapy evaluation for fecal                        incontinence as planned.                       - The  findings and recommendations were discussed with                        the patient and their spouse. Procedure Code(s):    --- Professional ---                       [phone], Colonoscopy, flexible; with biopsy, single or                        multiple Diagnosis Code(s):    --- Professional ---                       R15.9, Full incontinence of feces                       R19.4, Change in bowel habit                       K62.89, Other specified diseases of anus and rectum CPT copyright 2017 American Medical Association. All rights reserved. The codes documented in this report are preliminary and upon coder review may  be revised to meet current compliance requirements. Stanton Kidneyeodoro K Lizania Bouchard MD, MD 10/25/2017 1:48:42 PM This report has been signed electronically. Number of Addenda: 0 Note Initiated On: 10/25/2017 1:06 PM Scope Withdrawal Time: 0 hours 5 minutes 24 seconds  Total Procedure Duration: 0 hours 8 minutes 6 seconds       [name]

## 2017-10-26 ENCOUNTER — Encounter: Payer: Self-pay | Admitting: Internal Medicine

## 2017-10-26 ENCOUNTER — Emergency Department
Admission: EM | Admit: 2017-10-26 | Discharge: 2017-10-26 | Disposition: A | Discharge status: Home / Self Care | Payer: No Typology Code available for payment source | Attending: Emergency Medicine | Admitting: Emergency Medicine

## 2017-10-26 ENCOUNTER — Emergency Department: Payer: No Typology Code available for payment source

## 2017-10-26 ENCOUNTER — Other Ambulatory Visit: Payer: Self-pay

## 2017-10-26 DIAGNOSIS — Z79899 Other long term (current) drug therapy: Secondary | ICD-10-CM | POA: Diagnosis not present

## 2017-10-26 DIAGNOSIS — F419 Anxiety disorder, unspecified: Secondary | ICD-10-CM | POA: Diagnosis not present

## 2017-10-26 DIAGNOSIS — N939 Abnormal uterine and vaginal bleeding, unspecified: Secondary | ICD-10-CM | POA: Insufficient documentation

## 2017-10-26 DIAGNOSIS — Z87891 Personal history of nicotine dependence: Secondary | ICD-10-CM | POA: Diagnosis not present

## 2017-10-26 DIAGNOSIS — F329 Major depressive disorder, single episode, unspecified: Secondary | ICD-10-CM | POA: Diagnosis not present

## 2017-10-26 LAB — URINALYSIS, COMPLETE (UACMP) WITH MICROSCOPIC
BILIRUBIN URINE: NEGATIVE
Bacteria, UA: NONE SEEN
Glucose, UA: NEGATIVE mg/dL
Ketones, ur: NEGATIVE mg/dL
LEUKOCYTES UA: NEGATIVE
NITRITE: NEGATIVE
Protein, ur: 100 mg/dL — AB
RBC / HPF: >50 RBC/hpf — ABNORMAL HIGH (ref 0–5)
SPECIFIC GRAVITY, URINE: 1.029 (ref 1.005–1.030)
Squamous Epithelial / LPF: NONE SEEN (ref 0–5)
WBC, UA: NONE SEEN WBC/hpf (ref 0–5)
pH: 6 (ref 5.0–8.0)

## 2017-10-26 LAB — COMPREHENSIVE METABOLIC PANEL
ALT: 20 U/L (ref 0–44)
AST: 27 U/L (ref 15–41)
Albumin: 3.7 g/dL (ref 3.5–5.0)
Alkaline Phosphatase: 57 U/L (ref 38–126)
Anion gap: 7 (ref 5–15)
BILIRUBIN TOTAL: 0.4 mg/dL (ref 0.3–1.2)
BUN: 10 mg/dL (ref 6–20)
CO2: 24 mmol/L (ref 22–32)
CREATININE: 0.59 mg/dL (ref 0.44–1.00)
Calcium: 9 mg/dL (ref 8.9–10.3)
Chloride: 108 mmol/L (ref 98–111)
Glucose, Bld: 131 mg/dL — ABNORMAL HIGH (ref 70–99)
POTASSIUM: 3.4 mmol/L — AB (ref 3.5–5.1)
Sodium: 139 mmol/L (ref 135–145)
TOTAL PROTEIN: 7.2 g/dL (ref 6.5–8.1)

## 2017-10-26 LAB — CBC
HEMATOCRIT: 34.1 % — AB (ref 35.0–47.0)
Hemoglobin: 11.6 g/dL — ABNORMAL LOW (ref 12.0–16.0)
MCH: 28.3 pg (ref 26.0–34.0)
MCHC: 34.1 g/dL (ref 32.0–36.0)
MCV: 83 fL (ref 80.0–100.0)
PLATELETS: 279 10*3/uL (ref 150–440)
RBC: 4.11 MIL/uL (ref 3.80–5.20)
RDW: 13.9 % (ref 11.5–14.5)
WBC: 7.8 10*3/uL (ref 3.6–11.0)

## 2017-10-26 LAB — LIPASE, BLOOD: Lipase: 30 U/L (ref 11–51)

## 2017-10-26 LAB — POC URINE PREG, ED: Preg Test, Ur: NEGATIVE

## 2017-10-26 NOTE — ED Triage Notes (Signed)
Pt comes into the ED via POV c/o abdominal pain and vaginal bleeding.  Patient states she had an endoscopy and colonoscopy performed yesterday due to recurrent abdominal pain.  Patient states the symptoms started this morning.  Denies any chance of pregnancy that she is aware of.  Patient denies any N/V/D. Patient states she is soaking through a pad within an hour.  Patient in NAD at this time with even and unlabored respirations.  Denies any dizziness or shortness of breath.

## 2017-10-26 NOTE — ED Notes (Signed)
This nurse present during pelvic exam with Dr. Marisa SeverinSiadecki.

## 2017-10-26 NOTE — ED Provider Notes (Signed)
[facility] Emergency Department Provider Note ____________________________________________   First MD Initiated Contact with Patient 10/26/17 1507     (approximate)  I have reviewed the triage vital signs and the nursing notes.   HISTORY  Chief Complaint Abdominal Pain and Vaginal Bleeding    HPI Sonya [name] is a 27 y.o. female with PMH as noted below who presents with vaginal bleeding, acute onset over 1 day ago, described as relatively heavy with her using up to 3 tampons per hour although it seems to now be subsiding.  The patient reports crampy lower abdominal pain somewhat worse on the right, as well as low back pain.  She denies vaginal discharge or any urinary symptoms.  She has had no vomiting or fever.  The patient had colonoscopy and endoscopy performed yesterday.  Her last normal period was a few months ago but she states her periods have been irregular ever since she stopped using birth control shots in January.  Past Medical History:  Diagnosis Date  . Anxiety   . Depression   . Meniere disease UNKNOWN  . Vertigo     Patient Active Problem List   Diagnosis Date Noted  . Indication for care in labor and delivery, delivered 12/26/2014  . Indication for care or intervention related to labor and delivery 12/23/2014  . Labor and delivery, indication for care 10/13/2014    Past Surgical History:  Procedure Laterality Date  . COLONOSCOPY WITH PROPOFOL N/A 10/25/2017   Procedure: COLONOSCOPY WITH PROPOFOL;  Surgeon: Toledo, Boykin Nearing, MD;  Location: [facility] ENDOSCOPY;  Service: Gastroenterology;  Laterality: N/A;  . ESOPHAGOGASTRODUODENOSCOPY (EGD) WITH PROPOFOL N/A 10/25/2017   Procedure: ESOPHAGOGASTRODUODENOSCOPY (EGD) WITH PROPOFOL;  Surgeon: Toledo, Boykin Nearing, MD;  Location: [facility] ENDOSCOPY;  Service: Gastroenterology;  Laterality: N/A;  . WISDOM TOOTH EXTRACTION    . WRIST SURGERY      Prior to Admission medications   Medication Sig Start  Date End Date Taking? Authorizing Provider  Aspirin-Acetaminophen-Caffeine (GOODY HEADACHE PO) Take 1 packet by mouth once as needed (for headaches).    [provider]  Dexlansoprazole (DEXILANT) 30 MG capsule Take 30 mg by mouth daily.    [provider]  Ferrous Sulfate (IRON) 28 MG TABS Take 28 mg by mouth daily.    [provider]  FLUoxetine (PROZAC) 10 MG capsule Take 1 capsule (10 mg total) by mouth daily. Take it along with 20 mg Patient taking differently: Take 10 mg by mouth See admin instructions. Take 10 mg by mouth once a day- in conjunction with one 20 mg capsule to equal a total dose of 30 milligrams 08/17/17   Jomarie Longs, MD  FLUoxetine (PROZAC) 20 MG capsule Take 1 capsule (20 mg total) by mouth daily. Take it along with 10 mg Patient taking differently: Take 20 mg by mouth See admin instructions. Take 20 mg by mouth once a day- in conjunction with one 10 mg capsule to equal a total dose of 30 milligrams 08/17/17   Eappen, Levin Bacon, MD  fluticasone (FLONASE) 50 MCG/ACT nasal spray Place 1 spray into both nostrils daily.  09/08/17   [provider]  ibuprofen (ADVIL,MOTRIN) 800 MG tablet Take 800 mg by mouth every 6 (six) hours as needed (for pain or migraines).    [provider]  Lactobacillus Rhamnosus, GG, ([employer] PROBIOTIC, LACTOBACILLUS,) CAPS Take 1 capsule by mouth daily.    [provider]  meclizine (ANTIVERT) 25 MG tablet Take 25 mg by mouth 3 (three)  times daily as needed (for vertigo).  09/08/17   [provider]  Multiple Vitamins-Minerals (ONE-A-DAY WOMENS VITACRAVES) CHEW Chew 2 tablets by mouth daily.    [provider]  ondansetron (ZOFRAN ODT) 4 MG disintegrating tablet Take 1 tablet (4 mg total) by mouth every 6 (six) hours as needed for nausea or vomiting. 10/07/17   Evon Slack, PA-C  pantoprazole (PROTONIX) 40 MG tablet Take 40 mg by mouth daily before breakfast.     [provider]    promethazine (PHENERGAN) 25 MG tablet Take 1 tablet (25 mg total) by mouth every 6 (six) hours as needed for nausea or vomiting. Patient not taking: Reported on 10/25/2017 10/08/17   Irean Hong, MD  propranolol (INDERAL) 10 MG tablet Take 1 tablet (10 mg total) by mouth 2 (two) times daily as needed. For severe anxiety sx Patient taking differently: Take 10 mg by mouth 2 (two) times daily as needed (for severe anxiety symptoms).  08/17/17   Jomarie Longs, MD  traZODone (DESYREL) 100 MG tablet Take 1 tablet (100 mg total) by mouth at bedtime. 08/17/17   Jomarie Longs, MD  triamterene-hydrochlorothiazide (DYAZIDE) 37.5-25 MG capsule Take 1 capsule by mouth daily. 01/18/17   [provider]  VENTOLIN HFA 108 (90 Base) MCG/ACT inhaler Inhale 2 puffs into the lungs every 6 (six) hours as needed for wheezing or shortness of breath.  09/15/17   [provider]    Allergies Adipex-p [phentermine]; Codeine; and Adhesive [tape]  Family History  Problem Relation Age of Onset  . Graves' disease Mother   . Drug abuse Father   . Drug abuse Sister   . Depression Brother     Social History Social History   Tobacco Use  . Smoking status: Former Games developer  . Smokeless tobacco: Never Used  Substance Use Topics  . Alcohol use: No  . Drug use: No    Review of Systems  Constitutional: No fever. Eyes: No redness. ENT: No sore throat. Cardiovascular: Denies chest pain. Respiratory: Denies shortness of breath. Gastrointestinal: No vomiting.  Genitourinary: Negative for dysuria.  Positive for vaginal bleeding. Musculoskeletal: Positive for back pain. Skin: Negative for rash. Neurological: Negative for headache.   ____________________________________________   PHYSICAL EXAM:  VITAL SIGNS: ED Triage Vitals [10/26/17 1308]  Enc Vitals Group     BP      Pulse      Resp      Temp      Temp src      SpO2      Weight 203 lb 14.8 oz (92.5 kg)     Height 5' 2.5" (1.588 m)      Head Circumference      Peak Flow      Pain Score 4     Pain Loc      Pain Edu?      Excl. in GC?     Constitutional: Alert and oriented. Well appearing and in no acute distress. Eyes: Conjunctivae are normal.  No pallor. Head: Atraumatic. Nose: No congestion/rhinnorhea. Mouth/Throat: Mucous membranes are moist.   Neck: Normal range of motion.  Cardiovascular:  Good peripheral circulation. Respiratory: Normal respiratory effort. Gastrointestinal: Soft with mild bilateral suprapubic tenderness. No distention.  Genitourinary: No CVA tenderness.  Normal external genitalia.  Bilateral adnexal tenderness.  No CMT or significant discharge.  Very slow oozing from cervical os with no active hemorrhage. Musculoskeletal: No lower extremity edema.  Extremities warm and well perfused.  Neurologic:  Normal  speech and language. No gross focal neurologic deficits are appreciated.  Skin:  Skin is warm and dry. No rash noted. Psychiatric: Mood and affect are normal. Speech and behavior are normal.  ____________________________________________   LABS (all labs ordered are listed, but only abnormal results are displayed)  Labs Reviewed  COMPREHENSIVE METABOLIC PANEL - Abnormal; Notable for the following components:      Result Value   Potassium 3.4 (*)    Glucose, Bld 131 (*)    All other components within normal limits  CBC - Abnormal; Notable for the following components:   Hemoglobin 11.6 (*)    HCT 34.1 (*)    All other components within normal limits  URINALYSIS, COMPLETE (UACMP) WITH MICROSCOPIC - Abnormal; Notable for the following components:   Color, Urine RED (*)    APPearance CLOUDY (*)    Hgb urine dipstick LARGE (*)    Protein, ur 100 (*)    RBC / HPF >50 (*)    All other components within normal limits  LIPASE, BLOOD  POC URINE PREG, ED   ____________________________________________  EKG   ____________________________________________  RADIOLOGY  US pelvis: No  acute abnormalities ____________________________________________   PROCEDURES  Procedure(s) performed: No  Procedures  Critical Care performed: No ____________________________________________   INITIAL IMPRESSION / ASSESSMENT AND PLAN / ED COURSE  Pertinent labs & imaging results that were available during my care of the patient were reviewed by me and considered in my medical decision making (see chart for details).  27 year old female with PMH as noted above presents with vaginal bleeding as well as some lower abdominal crampy pain over the last day.  The patient had endoscopy and colonoscopy performed yesterday; I reviewed these records in epic, and they were relatively unremarkable.  On exam, she is well-appearing.  Vital signs are normal.  The abdomen is soft with mild suprapubic tenderness.  Vaginal exam is as noted above.  Differential includes DUB, dysmenorrhea, fibroid, endometriosis, ovarian cyst.  Patient's labs are reassuring.  We will obtain pelvic ultrasound and reassess.  ----------------------------------------- 5:58 PM on 10/26/2017 -----------------------------------------  Patient appears comfortable and states her pain is improved.  Her bleeding has also improved.  Ultrasound shows no concerning acute findings.  Overall I suspect most likely dysmenorrhea, DU B, or other benign cause.  I counseled the patient on the results of the work-up.  Return precautions given, and she expresses understanding.  She agrees to [facility].  ____________________________________________   FINAL CLINICAL IMPRESSION(S) / ED DIAGNOSES  Final diagnoses:  Vaginal bleeding  Abnormal vaginal bleeding      NEW MEDICATIONS STARTED DURING THIS VISIT:  New Prescriptions   No medications on file     Note:  This document was prepared using Dragon voice recognition software and may include unintentional dictation errors.    Dionne BucySiadecki,  Dishawn Bhargava, MD 10/26/17 1758

## 2017-10-26 NOTE — Discharge Instructions (Addendum)
Make an appointment to follow-up with the OB/GYN within the next few weeks as well as with your gastroenterologist as scheduled.  You can continue to take ibuprofen as needed for pain.  Return to the ER for new, worsening, persistent severe pain, bleeding, weakness or lightheadedness, or any other new or worsening symptoms that concern you.

## 2017-10-29 LAB — SURGICAL PATHOLOGY

## 2017-10-30 ENCOUNTER — Encounter: Payer: Self-pay | Admitting: Physical Therapy

## 2017-11-01 ENCOUNTER — Ambulatory Visit: Payer: No Typology Code available for payment source | Attending: Gastroenterology | Admitting: Physical Therapy

## 2017-11-01 ENCOUNTER — Encounter: Payer: Self-pay | Admitting: Physical Therapy

## 2017-11-01 ENCOUNTER — Other Ambulatory Visit: Payer: Self-pay

## 2017-11-01 DIAGNOSIS — M2170 Unequal limb length (acquired), unspecified site: Secondary | ICD-10-CM

## 2017-11-01 DIAGNOSIS — M533 Sacrococcygeal disorders, not elsewhere classified: Secondary | ICD-10-CM

## 2017-11-01 DIAGNOSIS — H8109 Meniere's disease, unspecified ear: Secondary | ICD-10-CM | POA: Diagnosis not present

## 2017-11-01 DIAGNOSIS — Z7951 Long term (current) use of inhaled steroids: Secondary | ICD-10-CM | POA: Diagnosis not present

## 2017-11-01 DIAGNOSIS — F419 Anxiety disorder, unspecified: Secondary | ICD-10-CM | POA: Diagnosis not present

## 2017-11-01 DIAGNOSIS — Z87891 Personal history of nicotine dependence: Secondary | ICD-10-CM | POA: Diagnosis not present

## 2017-11-01 DIAGNOSIS — E860 Dehydration: Secondary | ICD-10-CM | POA: Insufficient documentation

## 2017-11-01 DIAGNOSIS — A09 Infectious gastroenteritis and colitis, unspecified: Secondary | ICD-10-CM | POA: Insufficient documentation

## 2017-11-01 DIAGNOSIS — R197 Diarrhea, unspecified: Secondary | ICD-10-CM | POA: Diagnosis present

## 2017-11-01 DIAGNOSIS — F329 Major depressive disorder, single episode, unspecified: Secondary | ICD-10-CM | POA: Diagnosis not present

## 2017-11-01 DIAGNOSIS — M791 Myalgia, unspecified site: Secondary | ICD-10-CM

## 2017-11-02 NOTE — Therapy (Signed)
[place] [facility] MAIN [facility] SERVICES [address] [place], [place], [address] Phone: [phone]   Fax:  [phone]  Physical Therapy Evaluation  Patient Details  Name: Sonya [name] MRN: [id number] Date of Birth: October 06, 1990 Referring Provider: Jacob Moores, MD   Encounter Date: 11/01/2017  PT End of Session - 11/02/17 0945    Visit Number  1    Number of Visits  12    Date for PT Re-Evaluation  01/25/18    PT Start Time  1105    PT Stop Time  1200    PT Time Calculation (min)  55 min    Activity Tolerance  Patient tolerated treatment well;No increased pain    Behavior During Therapy  WFL for tasks assessed/performed       Past Medical History:  Diagnosis Date  . Anxiety   . Depression   . Meniere disease UNKNOWN  . Vertigo     Past Surgical History:  Procedure Laterality Date  . COLONOSCOPY WITH PROPOFOL N/A 10/25/2017   Procedure: COLONOSCOPY WITH PROPOFOL;  Surgeon: Toledo, Boykin Nearing, MD;  Location: [facility] ENDOSCOPY;  Service: Gastroenterology;  Laterality: N/A;  . ESOPHAGOGASTRODUODENOSCOPY (EGD) WITH PROPOFOL N/A 10/25/2017   Procedure: ESOPHAGOGASTRODUODENOSCOPY (EGD) WITH PROPOFOL;  Surgeon: Toledo, Boykin Nearing, MD;  Location: [facility] ENDOSCOPY;  Service: Gastroenterology;  Laterality: N/A;  . WISDOM TOOTH EXTRACTION    . WRIST SURGERY      There were no vitals filed for this visit.   Subjective Assessment - 11/01/17 1110    Subjective  Fecal incontinence:  Pt noticed leakage after the birth of her first child.  Pt had a 4th degree tear during L & D. Pt thought it was normal at first but she consulted the [facility] and she learned it was not normal. Pt has seen her PCP who referred her to her GI doctor (Dr. [name]) who later referred her to Pelvic PT. Leakage amount is little after soft or loose bowel movements. Frequency of BMs 2-3 x a day. Pt had E.coli in the begining of August which involved Type 6-7 ([place]) stool  consistency. The leakage was worst during this time. Prior to getting E.coli, pt had Type 4 stools almost every time but leakage also occured with that consistency. Leakage also occurs when she passes gas. Frequency of leakage is 60-70% of the time after bowel movements, passing gas. Pt has returned to working which involved being on her feet all day. Denied excessive bending nor lifting at work and home.  Fitness goals: Pt would like to return yoga , lose weight, be more active.  Pt can not do strenuous exercises.    2) Urinary incontinence occurs with sneezing, laughing, getting out of the chair.    3) pelvic discomfort/pain with pelvic exam at OB-GYN visits, wearing tampons and sexual intercourse started after her dtr was born.  Pt has sensitive skin and she has to alter between tampons and pads. Sometimes it is uncomfortable with inserting tampons.    4) CLBP since her dtr was born. It occurs at her tailbone. Pt has sciatica down R leg while pt was pregnant with her dtr and it still persists.  It occurs randomly. 10/10 and pt has difficulty adjusting herself and walking when the pain comes on. Pt has had a fall on her tailbone as a child.    5) neck ache since pt had her neck immoblized when she was 14 due to landing on her head on teh bottom of  head. Pt had difficutly looking up . This past neck injury lasted 1 year without treatment.  Pt states her neck has never been the same. Currently, her neck ache occurs 2 x week comes on at teh end of the day. Pt feels there is a large lump at the back of her neck one year after her injury but currently no pain when pressing on it.          Pertinent History  Begining of August, pt had 2 visits tot he ER due to dehydration for one visit and E. coli for another. Pt completed her treatment for E. coli. Pt had a colonscopy and endoscopy last week and she has not been informed yet about biopsies. Pt had these tests done for GERD and no sphincter tone.  Pt is due for  f/u with GI doctor 11/13/17.           [facility] PT Assessment - 11/02/17 0900      Assessment   Medical Diagnosis  incontience of feces    Referring Provider  Jacob Moores, MD      Precautions   Precautions  None      Restrictions   Weight Bearing Restrictions  No      Balance Screen   Has the patient fallen in the past 6 months  No      Prior Function   Level of Independence  Independent      Observation/Other Assessments   Observations  supine: Leg length R 80cm, L 81 cm        Coordination   Gross Motor Movements are Fluid and Coordinated  --   abdominal straining with cue for bowel movements     Single Leg Stance   Comments  R SLS with difficulty with reproduction of CLBP without radiating pain       AROM   Overall AROM   --   WFL all directions      Palpation   SI assessment   Standing: L iliac crest higher than R,  supine, R ASIS inferior     Palpation comment  signficantly increased tensions with posterior pelvic floor mm B                 Objective measurements completed on examination: See above findings.    Pelvic Floor Special Questions - 11/01/17 1148    External Perineal Exam  through clothing: flinching tenderness along ischial rami and tuberosity B ( post Tx: with breathing technique, no fliching tenderness)        [facility] Adult PT Treatment/Exercise - 11/02/17 0901      Neuro Re-ed    Neuro Re-ed Details   see pt instructions       Manual Therapy   Manual therapy comments  R long axis distraction: hip flexion with AP mob Grade II                   PT Long Term Goals - 11/01/17 1123      PT LONG TERM GOAL #1   Title  Pt will report decreased frequency of fecal leakage from  60-70% of the time after bowel movements, passing gas to <25% of the time in order to perform ADLs    Time  8    Period  Weeks    Status  New    Target Date  12/27/17      PT LONG TERM GOAL #2   Title  Pt will report no  leakage 100% of the time  across one week in order to improve QOL     Time  12    Period  Weeks    Status  New    Target Date  01/24/18      PT LONG TERM GOAL #3   Title  Pt will demo equal pelvic alignment across 2 visits  with compliance wearing shoe lift in R shoe in order to promote improved back pain and pelvic function to progress to strengthening and stretching exercises     Time  4    Period  Weeks    Status  New    Target Date  11/30/17      PT LONG TERM GOAL #4   Title  Pt will demo no pelvic floor tensions, proper pelvic floor coordination in order to minimize urinary and fecal leakage.    Time  6    Period  Weeks    Status  New    Target Date  12/14/17      PT LONG TERM GOAL #5   Title  Pt will decrease her PFDI score from 46% to < 36% in order to imp rove pelvic floor function    Time  12    Period  Weeks    Status  New    Target Date  01/25/18      Additional Long Term Goals   Additional Long Term Goals  Yes      PT LONG TERM GOAL #6   Title  Pt will decrease PDI score  14% to < 7% in order to improve QOL     Time  12    Period  Weeks    Status  New    Target Date  01/25/18             Plan - 11/02/17 1325    Clinical Impression Statement  Pt is a 27 yo female who has urinary /fecal incontinence , CLBP, pelvic pain, and neck pain that impact her QOL and ADLs. These deficits except for the neck pain became an issue related to her first pregnancy in 2016. Pt's clinical presentation showed pelvic obliquities, leg length difference, pelvic floor tightness, spinal mm tightness, forward head posture with dowager's hump, dyscoordination of pelvic floor mm. Plan to assess pelvic floor at upcoming sessions given pt's Hx of 4th degree perineal tear which likely has a strong impact on her pelvic floor dysfunctions. Following today's session, pt's pelvic alignment was restored and pt was provided a shoe lift in her R shoe. Pt regained stability with SLS on RLE. Pt will benefit from skilled PT.     Rehab Potential  Good    PT Frequency  1x / week    PT Duration  12 weeks    PT Treatment/Interventions  Gait training;Stair training;Neuromuscular re-education;Functional mobility training;Moist Heat;Biofeedback;Traction;Therapeutic activities;Therapeutic exercise;Balance training;Patient/family education;Manual techniques;Scar mobilization;Taping    Consulted and Agree with Plan of Care  Patient       Patient will benefit from skilled therapeutic intervention in order to improve the following deficits and impairments:  Improper body mechanics, Pain, Postural dysfunction, Increased muscle spasms, Decreased mobility, Decreased scar mobility, Decreased coordination, Decreased activity tolerance, Decreased safety awareness, Impaired flexibility, Decreased range of motion, Decreased endurance, Decreased strength, Hypomobility  Visit Diagnosis: Myalgia  Sacrococcygeal disorders, not elsewhere classified  Leg length discrepancy     Problem List Patient Active Problem List   Diagnosis Date Noted  . Indication for care in labor  and delivery, delivered 12/26/2014  . Indication for care or intervention related to labor and delivery 12/23/2014  . Labor and delivery, indication for care 10/13/2014    Mariane Masters ,PT, DPT, E-RYT  11/02/2017, 1:26 PM  [place] [facility] MAIN [facility] SERVICES [address] [place], [place], [address] Phone: [phone]   Fax:  [phone]  Name: Annasophia Crocker MRN: [id number] Date of Birth: [birth year]

## 2017-11-03 ENCOUNTER — Ambulatory Visit: Payer: No Typology Code available for payment source | Admitting: Psychiatry

## 2017-11-04 ENCOUNTER — Other Ambulatory Visit
Admission: RE | Admit: 2017-11-04 | Discharge: 2017-11-04 | Disposition: A | Discharge status: Home / Self Care | Payer: No Typology Code available for payment source | Source: Ambulatory Visit | Attending: Internal Medicine | Admitting: Internal Medicine

## 2017-11-04 DIAGNOSIS — R110 Nausea: Secondary | ICD-10-CM | POA: Diagnosis not present

## 2017-11-04 DIAGNOSIS — R197 Diarrhea, unspecified: Secondary | ICD-10-CM | POA: Diagnosis present

## 2017-11-04 LAB — GASTROINTESTINAL PANEL BY PCR, STOOL (REPLACES STOOL CULTURE)

## 2017-11-08 ENCOUNTER — Ambulatory Visit: Payer: No Typology Code available for payment source | Attending: Gastroenterology | Admitting: Physical Therapy

## 2017-11-08 DIAGNOSIS — M2170 Unequal limb length (acquired), unspecified site: Secondary | ICD-10-CM | POA: Diagnosis present

## 2017-11-08 DIAGNOSIS — M791 Myalgia, unspecified site: Secondary | ICD-10-CM | POA: Diagnosis present

## 2017-11-08 DIAGNOSIS — M533 Sacrococcygeal disorders, not elsewhere classified: Secondary | ICD-10-CM

## 2017-11-08 NOTE — Patient Instructions (Addendum)
      Frog stretch: laying on belly with pillow under hips, knees bent, inhale do nothing, exhale let ankles fall apart  X 30 reps     Prone Heel Press for strengthening sacro-iliac joints  1. Lie on your belly. If you have an arch in your low back or it feels umcomfortable, place a pillow under your low belly/hips to make sure your low back feel comfortable.   2. Place our forehead on top of your palms.      Widen your knees apart for starting position.   3. Inhale, feel belly and low back expand  4. Exhale, feel belly hug in, press heel together and count aloud for 5 sec. Then relax the heel squeezing.  Perform 10 reps of 5 sec holds. 2 sets/ day.    If you feel entire buttock tighten too much or feel low back pain, apply 50% less effort. As you press your heel together, you will feel as if your pubic bone (front of your pelvis) and sacrum (back of your pelvis) gentle move towards each other or your low abdominal muscles hug in more.       ______  Stretch for pelvic floor   "v heels slide away and then back toward buttocks and then rock knee to slight ,  slide heel along at 11 o clock away from buttocks   10 reps   _____  Standing posture  Shift navel forward slightly, shift weight across heel and ball mound, unlock knees  Neutral spine and pelvic alignment (like zipping a zipper)    Laying on your back:  find pelvic neutral by bridging up first , then lower ribs and lastly, your pelvis

## 2017-11-08 NOTE — Therapy (Signed)
[place] [facility] MAIN [facility] SERVICES [address] [place], [place], [address] Phone: [phone]   Fax:  [phone]  Physical Therapy Treatment  Patient Details  Name: Sonya [name] MRN: [id number] Date of Birth: 05-May-1990 Referring Provider: Jacob Moores, MD   Encounter Date: 11/08/2017  PT End of Session - 11/08/17 1216    Visit Number  2    Number of Visits  12    Date for PT Re-Evaluation  01/25/18    PT Start Time  1105    PT Stop Time  1205    PT Time Calculation (min)  60 min    Activity Tolerance  Patient tolerated treatment well;No increased pain    Behavior During Therapy  WFL for tasks assessed/performed       Past Medical History:  Diagnosis Date  . Anxiety   . Depression   . Meniere disease UNKNOWN  . Vertigo     Past Surgical History:  Procedure Laterality Date  . COLONOSCOPY WITH PROPOFOL N/A 10/25/2017   Procedure: COLONOSCOPY WITH PROPOFOL;  Surgeon: Toledo, Boykin Nearing, MD;  Location: [facility] ENDOSCOPY;  Service: Gastroenterology;  Laterality: N/A;  . ESOPHAGOGASTRODUODENOSCOPY (EGD) WITH PROPOFOL N/A 10/25/2017   Procedure: ESOPHAGOGASTRODUODENOSCOPY (EGD) WITH PROPOFOL;  Surgeon: Toledo, Boykin Nearing, MD;  Location: [facility] ENDOSCOPY;  Service: Gastroenterology;  Laterality: N/A;  . WISDOM TOOTH EXTRACTION    . WRIST SURGERY      There were no vitals filed for this visit.  Subjective Assessment - 11/08/17 1109    Subjective  Pt has noticed any sciatica since last week. Pt was on her feet all day and then noticed Rleg achiness but not radiating pain. Pt has noticed since doing the PT exercises, she has not had pain with intercourse.     Pertinent History  Begining of August, pt had 2 visits tot he ER due to dehydration for one visit and E. coli for another. Pt completed her treatment for E. coli. Pt had a colonscopy and endoscopy last week and she has not been informed yet about biopsies. Pt had these tests done for GERD and no  sphincter tone.  Pt is due for f/u with GI doctor 11/13/17.           [facility] PT Assessment - 11/08/17 1116      Coordination   Coordination and Movement Description  delayed firing of multifidis on RLE in sequence with hamstring. gluts compared to L       Strength   Overall Strength Comments  hip ext R4/5, L3+5, hip abd B 3/5        Palpation   SI assessment     hypomobility with tenderness L SIJ        Bed Mobility   Bed Mobility  --   report of pain at R PSIS in supine pre/post Tx               Pelvic Floor Special Questions - 11/08/17 1217    External Perineal Exam  without clothing with consent to assess posterior muscles externally . noted increased STM tensions at L ischial tuberosity/ ischial rami,        [facility] Adult PT Treatment/Exercise - 11/08/17 1214      Neuro Re-ed    Neuro Re-ed Details   see pt instructions       Moist Heat Therapy   Number Minutes Moist Heat  10 Minutes    Moist Heat Location  Other (comment)   sacrum  Manual Therapy   Manual therapy comments  STM/MWM at ischial tuberosity/ ischial rami,  PA mob at L SIJ                    PT Long Term Goals - 11/01/17 1123      PT LONG TERM GOAL #1   Title  Pt will report decreased frequency of fecal leakage from  60-70% of the time after bowel movements, passing gas to <25% of the time in order to perform ADLs    Time  8    Period  Weeks    Status  New    Target Date  12/27/17      PT LONG TERM GOAL #2   Title  Pt will report no leakage 100% of the time across one week in order to improve QOL     Time  12    Period  Weeks    Status  New    Target Date  01/24/18      PT LONG TERM GOAL #3   Title  Pt will demo equal pelvic alignment across 2 visits  with compliance wearing shoe lift in R shoe in order to promote improved back pain and pelvic function to progress to strengthening and stretching exercises     Time  4    Period  Weeks    Status  New    Target Date   11/30/17      PT LONG TERM GOAL #4   Title  Pt will demo no pelvic floor tensions, proper pelvic floor coordination in order to minimize urinary and fecal leakage.    Time  6    Period  Weeks    Status  New    Target Date  12/14/17      PT LONG TERM GOAL #5   Title  Pt will decrease her PFDI score from 46% to < 36% in order to imp rove pelvic floor function    Time  12    Period  Weeks    Status  New    Target Date  01/25/18      Additional Long Term Goals   Additional Long Term Goals  Yes      PT LONG TERM GOAL #6   Title  Pt will decrease PDI score  14% to < 7% in order to improve QOL     Time  12    Period  Weeks    Status  New    Target Date  01/25/18            Plan - 11/08/17 1216    Clinical Impression Statement  Pt showed equal pelvic alignment upon arrival today with compliance to shoe lift. Addressed SIJ hypomobility ( L > R) and posterior mm tightness with external manual Tx which pt tolerated without increased pain. Pt showed decerased mm tightness and increased mobility at SIJ. ADdressed education on standing posture to correct  hyperextension of B knees. Tactile cues were provided to not rolling R ankle into supination and to stand with equal weight on both LE to minimize L SIJ hypomobility and pelvic floor tightness. Pt continues to benefit from skilled PT.     Rehab Potential  Good    PT Frequency  1x / week    PT Duration  12 weeks    PT Treatment/Interventions  Gait training;Stair training;Neuromuscular re-education;Functional mobility training;Moist Heat;Biofeedback;Traction;Therapeutic activities;Therapeutic exercise;Balance training;Patient/family education;Manual techniques;Scar mobilization;Taping    Consulted and Agree with  Plan of Care  Patient       Patient will benefit from skilled therapeutic intervention in order to improve the following deficits and impairments:  Improper body mechanics, Pain, Postural dysfunction, Increased muscle spasms,  Decreased mobility, Decreased scar mobility, Decreased coordination, Decreased activity tolerance, Decreased safety awareness, Impaired flexibility, Decreased range of motion, Decreased endurance, Decreased strength, Hypomobility  Visit Diagnosis: Leg length discrepancy  Sacrococcygeal disorders, not elsewhere classified  Myalgia     Problem List Patient Active Problem List   Diagnosis Date Noted  . Indication for care in labor and delivery, delivered 12/26/2014  . Indication for care or intervention related to labor and delivery 12/23/2014  . Labor and delivery, indication for care 10/13/2014    Mariane Masters ,PT, DPT, E-RYT  11/08/2017, 12:22 PM  [place] [facility] MAIN [facility] SERVICES [address] [place], [place], [address] Phone: [phone]   Fax:  [phone]  Name: Mahogany Torrance MRN: [id number] Date of Birth: December 22, 1990

## 2017-11-09 ENCOUNTER — Encounter: Payer: No Typology Code available for payment source | Admitting: Physical Therapy

## 2017-11-23 ENCOUNTER — Telehealth: Payer: Self-pay

## 2017-11-23 ENCOUNTER — Encounter: Payer: Self-pay | Admitting: Physical Therapy

## 2017-11-23 NOTE — Telephone Encounter (Signed)
Ok thanks 

## 2017-11-23 NOTE — Telephone Encounter (Signed)
called 911 to do a well fare check on patient since i did here man yelling and door slam and patient would speak and then suddenly stop.  the 911 operator states that police office will call back with report.

## 2017-11-23 NOTE — Telephone Encounter (Signed)
pt called states that her anxiety is really bad and that she can not take the prozac because she could not have incourse with that medication.  and that the only thing the doctor put her on was propranolol and that doesnt work.

## 2017-11-23 NOTE — Telephone Encounter (Signed)
We should call security and ask for a wellness check on this patient .

## 2017-11-24 ENCOUNTER — Ambulatory Visit: Payer: No Typology Code available for payment source | Admitting: Physical Therapy

## 2017-11-24 DIAGNOSIS — M2170 Unequal limb length (acquired), unspecified site: Secondary | ICD-10-CM

## 2017-11-24 DIAGNOSIS — M533 Sacrococcygeal disorders, not elsewhere classified: Secondary | ICD-10-CM

## 2017-11-24 DIAGNOSIS — M791 Myalgia, unspecified site: Secondary | ICD-10-CM

## 2017-11-24 NOTE — Therapy (Signed)
[place] [facility] [address] [place], [place], [place], [address] Phone: [phone]   Fax:  [phone]  Physical Therapy Treatment  Patient Details  Name: Sonya [name] MRN: [id number] Date of Birth: 01/17/1991 Referring Provider: Jacob MooresMason Croley, MD   Encounter Date: 11/24/2017  PT End of Session - 11/24/17 1835    Visit Number  3    Number of Visits  12    Date for PT Re-Evaluation  01/25/18    PT Start Time  1117    PT Stop Time  1215    PT Time Calculation (min)  58 min    Activity Tolerance  Patient tolerated treatment well;No increased pain    Behavior During Therapy  WFL for tasks assessed/performed       Past Medical History:  Diagnosis Date  . Anxiety   . Depression   . Meniere disease UNKNOWN  . Vertigo     Past Surgical History:  Procedure Laterality Date  . COLONOSCOPY WITH PROPOFOL N/A 10/25/2017   Procedure: COLONOSCOPY WITH PROPOFOL;  Surgeon: Toledo, Boykin Nearingeodoro K, MD;  Location: [facility] ENDOSCOPY;  Service: Gastroenterology;  Laterality: N/A;  . ESOPHAGOGASTRODUODENOSCOPY (EGD) WITH PROPOFOL N/A 10/25/2017   Procedure: ESOPHAGOGASTRODUODENOSCOPY (EGD) WITH PROPOFOL;  Surgeon: Toledo, Boykin Nearingeodoro K, MD;  Location: [facility] ENDOSCOPY;  Service: Gastroenterology;  Laterality: N/A;  . WISDOM TOOTH EXTRACTION    . WRIST SURGERY      There were no vitals filed for this visit.  Subjective Assessment - 11/24/17 1122    Subjective  Pt has not any sciatic across the past 4 weeks since the first session. Pt 's fecal leakage has been minimal. Pt's urinal leakage has been bad with coughing, laughing, and sneezing. Pt had a f/u appt with her doctor who checked for E.coli. Pt was cleared of E. coli as of 11/04/17.        Pertinent History  Begining of August, pt had 2 visits tot he ER due to dehydration for one visit and E. coli for another. Pt completed her treatment for E. coli. Pt had a colonscopy and endoscopy last week and she has not  been informed yet about biopsies. Pt had these tests done for GERD and no sphincter tone.  Pt is due for f/u with GI doctor 11/13/17.           [facility] - 11/24/17 1202      Observation/Other Assessments   Observations  seated with ankle supinated, hip abd/ER, poor knee and foot alignment        Strength   Overall Strength Comments  hip abd 3/5, ankle eversion 3/5 B       Bed Mobility   Bed Mobility  --   R SIJ pain inhooklying initially, post stretches , no pain                Pelvic Floor Special Questions - 11/24/17 1203    External Perineal Exam  without clothing with consent to assess posterior muscles externally . noted increased STM tensions at B ischiacavernosus, bulbospongiosus, transverse perineal, urethra compressor tensions and tenderenss B ( L>R). noted ankle with more weightbearing on lateral arch, hipabd/ER, poor stabilization of knee/ ankle/feet in hooklying         [facility] Treatment/Exercise - 11/24/17 1202      Exercises   Exercises  --   see pt instructions     Manual Therapy   Manual therapy comments  STM, MWM at [place]  areas noted B                   PT Long Term Goals - 11/01/17 1123      PT LONG TERM GOAL #1   Title  Pt will report decreased frequency of fecal leakage from  60-70% of the time after bowel movements, passing gas to <25% of the time in order to perform ADLs    Time  8    Period  Weeks    Status  New    Target Date  12/27/17      PT LONG TERM GOAL #2   Title  Pt will report no leakage 100% of the time across one week in order to improve QOL     Time  12    Period  Weeks    Status  New    Target Date  01/24/18      PT LONG TERM GOAL #3   Title  Pt will demo equal pelvic alignment across 2 visits  with compliance wearing shoe lift in R shoe in order to promote improved back pain and pelvic function to progress to strengthening and stretching exercises     Time  4    Period  Weeks    Status   New    Target Date  11/30/17      PT LONG TERM GOAL #4   Title  Pt will demo no pelvic floor tensions, proper pelvic floor coordination in order to minimize urinary and fecal leakage.    Time  6    Period  Weeks    Status  New    Target Date  12/14/17      PT LONG TERM GOAL #5   Title  Pt will decrease her PFDI score from 46% to < 36% in order to imp rove pelvic floor function    Time  12    Period  Weeks    Status  New    Target Date  01/25/18      Additional Long Term Goals   Additional Long Term Goals  Yes      PT LONG TERM GOAL #6   Title  Pt will decrease PDI score  14% to < 7% in order to improve QOL     Time  12    Period  Weeks    Status  New    Target Date  01/25/18            Plan - 11/24/17 1835    Clinical Impression Statement  Pt    Rehab Potential  Good    PT Frequency  1x / week    PT Duration  12 weeks    PT Treatment/Interventions  Gait training;Stair training;Neuromuscular re-education;Functional mobility training;Moist Heat;Biofeedback;Traction;Therapeutic activities;Therapeutic exercise;Balance training;Patient/family education;Manual techniques;Scar mobilization;Taping    Consulted and Agree with Plan of Care  Patient       Patient will benefit from skilled therapeutic intervention in order to improve the following deficits and impairments:  Improper body mechanics, Pain, Postural dysfunction, Increased muscle spasms, Decreased mobility, Decreased scar mobility, Decreased coordination, Decreased activity tolerance, Decreased safety awareness, Impaired flexibility, Decreased range of motion, Decreased endurance, Decreased strength, Hypomobility  Visit Diagnosis: Sacrococcygeal disorders, not elsewhere classified  Leg length discrepancy  Myalgia     Problem List Patient Active Problem List   Diagnosis Date Noted  . Indication for care in labor and delivery, delivered 12/26/2014  . Indication for care or intervention related to  labor and  delivery 12/23/2014  . Labor and delivery, indication for care 10/13/2014    Mariane Masters ,PT, DPT, E-RYT  11/24/2017, 6:37 PM  [facility] MAIN [facility] SERVICES [address] [place], [place], [address] Phone: [phone]   Fax:  [phone]  Name: Sonya [name] MRN: [id number] Date of Birth: October 02, 1990

## 2017-11-24 NOTE — Patient Instructions (Addendum)
  Clam Shell 45 Degrees   Lying with hips and knees bent 45, one pillow between knees and ankles. Lift knee with exhale. Be sure pelvis does not roll backward. Do not arch back. Do 30 times, each leg, 2 times per day.     Complimentary stretch: Cross _ foot over _ thigh, opposite knee straight  3 breaths   Cross thigh over thigh, exhale to hug the thighs in with arms pulling back of thigh, shoulders/ head is relaxed down , Use towel behind thigh is needed.  10 reps   Ankle strengthening on L with band band wrapped around outer L side of foot ballmound of L foot pressing onto band , R foot is placed on top of band hip width apart, with the ballmound ,  R hand holds the band 15 reps swinging L pinky toe out to the L  X 2x day Both sides   ___   Sit with feet flat on ground, not rolling ankles out  Seated figure 4 stretches to minimize LBP

## 2017-11-26 ENCOUNTER — Telehealth: Payer: No Typology Code available for payment source | Admitting: Family

## 2017-11-26 DIAGNOSIS — R6889 Other general symptoms and signs: Secondary | ICD-10-CM | POA: Diagnosis not present

## 2017-11-26 NOTE — Progress Notes (Signed)

## 2017-11-28 ENCOUNTER — Ambulatory Visit (INDEPENDENT_AMBULATORY_CARE_PROVIDER_SITE_OTHER): Payer: No Typology Code available for payment source | Admitting: Psychiatry

## 2017-11-28 ENCOUNTER — Other Ambulatory Visit: Payer: Self-pay

## 2017-11-28 ENCOUNTER — Ambulatory Visit (HOSPITAL_BASED_OUTPATIENT_CLINIC_OR_DEPARTMENT_OTHER): Payer: No Typology Code available for payment source | Admitting: Licensed Clinical Social Worker

## 2017-11-28 ENCOUNTER — Encounter: Payer: Self-pay | Admitting: Psychiatry

## 2017-11-28 VITALS — BP 108/72 | HR 75 | Temp 98.1°F | Wt 203.6 lb

## 2017-11-28 DIAGNOSIS — F331 Major depressive disorder, recurrent, moderate: Secondary | ICD-10-CM

## 2017-11-28 DIAGNOSIS — F411 Generalized anxiety disorder: Secondary | ICD-10-CM

## 2017-11-28 MED ORDER — TRAZODONE HCL 100 MG PO TABS: 100.0000 mg | ORAL_TABLET | Freq: Every day | ORAL | 0 refills | Status: DC

## 2017-11-28 MED ORDER — CLONAZEPAM 0.5 MG PO TABS: 0.5000 mg | ORAL_TABLET | ORAL | 0 refills | Status: DC

## 2017-11-28 MED ORDER — DULOXETINE HCL 30 MG PO CPEP: 30.0000 mg | ORAL_CAPSULE | Freq: Every day | ORAL | 0 refills | Status: DC

## 2017-11-28 NOTE — Progress Notes (Signed)
BH MD  OP Progress Note  11/28/2017 2:27 PM Sonya [name]  MRN:  [id number]  Chief Complaint: ' I am here for follow up.' Chief Complaint    Follow-up; Medication Refill     HPI: Sonya [name] is a 27 year old Caucasian female who is currently employed, married, lives in [place], has a history of depression, anxiety, presented to the clinic today for a follow-up visit.  Patient had rescheduled her previous visit with Clinical research associate.  The last time patient was seen in clinic was 08/17/2017.  She today presented along with her husband Sonya [name]."  Patient reports she stopped taking her Prozac a month ago.  She reports she had some sexual side effects and hence did not want to keep taking it.  Patient today reports her anxiety and depressive symptoms as worse.  She reports there are days when she feels extremely anxious and frustrated and is unable to focus on anything.  She reports she tried taking propranolol and it does not help much during those days.  Patient reports she is worried about starting other medications in the class of Prozac since she already failed a few medications in the past.  Patient also reports some chronic self-injurious thoughts which comes and goes.  Patient however currently denies it.  She reports she does not have a plan.  She reports she will never act on her thoughts.  She reports she however does not know why these kind of thoughts come back to her when she is stressed out.  She reports she can reach out to her husband when she has these thoughts.  Discussed referral for intensive outpatient program.  Patient however reports she does not want to do that since she has to go back to go back to her work.  Discussed intensive individual psychotherapy sessions on a weekly basis.  She reports she will start psychotherapy sessions first.  Also discussed the option of inpatient mental health admission if she continues to decompensate.  Crisis plan discussed with patient as well as husband  [name].  Discussed alternating medication options with patient.  Discussed Viibryd however patient reports she may not be able to afford it.  Hence discussed Cymbalta.  Also discussed genesight testing.  Provided her information and she will reach out to them.  During earlier visits patient had noted relationship struggles with husband.Pt recently had called the clinic for medication concerns and was acting weird on the phone with our nurse and the nurse could hear yelling and slamming doors in the back ground . Phone got disconnected and patient was not reachable after that and hence  911 was called to do a welfare check on patient that day. Pt today on discussion about this specific incident reported that her child was slamming doors and she and her husband are doing OK .She wanted her husband to be part of this visit today and hence he also participated and appeared to be supportive .   Visit Diagnosis:    ICD-10-CM   1. MDD (major depressive disorder), recurrent episode, moderate (HCC) F33.1 traZODone (DESYREL) 100 MG tablet    DULoxetine (CYMBALTA) 30 MG capsule    clonazePAM (KLONOPIN) 0.5 MG tablet  2. GAD (generalized anxiety disorder) F41.1 traZODone (DESYREL) 100 MG tablet    DULoxetine (CYMBALTA) 30 MG capsule    clonazePAM (KLONOPIN) 0.5 MG tablet    Past Psychiatric History: Reviewed past psychiatric history from my progress note on 06/21/2017.  Past trials of Lexapro, Wellbutrin, Prozac, Luvox, Pristiq, Abilify.  Past  Medical History:  Past Medical History:  Diagnosis Date  . Anxiety   . Depression   . Meniere disease UNKNOWN  . Vertigo     Past Surgical History:  Procedure Laterality Date  . COLONOSCOPY WITH PROPOFOL N/A 10/25/2017   Procedure: COLONOSCOPY WITH PROPOFOL;  Surgeon: Toledo, Boykin Nearing, MD;  Location: [facility]COPY;  Service: Gastroenterology;  Laterality: N/A;  . ESOPHAGOGASTRODUODENOSCOPY (EGD) WITH PROPOFOL N/A 10/25/2017   Procedure:  ESOPHAGOGASTRODUODENOSCOPY (EGD) WITH PROPOFOL;  Surgeon: Toledo, Boykin Nearing, MD;  Location: [facility] ENDOSCOPY;  Service: Gastroenterology;  Laterality: N/A;  . WISDOM TOOTH EXTRACTION    . WRIST SURGERY      Family Psychiatric History: Reviewed family psychiatric history from my progress note on 06/21/2017  Family History:  Family History  Problem Relation Age of Onset  . Graves' disease Mother   . Drug abuse Father   . Drug abuse Sister   . Depression Brother     Social History: Reviewed social history from my progress note on 06/21/2017 Social History   Socioeconomic History  . Marital status: Married    Spouse name: calvin  . Number of children: 1  . Years of education: Not on file  . Highest education level: Associate degree: occupational, Scientist, product/process development, or vocational program  Occupational History    Comment: not employed  Social Needs  . Financial resource strain: Hard  . Food insecurity:    Worry: Often true    Inability: Often true  . Transportation needs:    Medical: No    Non-medical: No  Tobacco Use  . Smoking status: Former Games developer  . Smokeless tobacco: Never Used  Substance and Sexual Activity  . Alcohol use: No  . Drug use: No  . Sexual activity: Yes  Lifestyle  . Physical activity:    Days per week: 0 days    Minutes per session: 0 min  . Stress: Not on file  Relationships  . Social connections:    Talks on phone: More than three times a week    Gets together: More than three times a week    Attends religious service: Never    Active member of club or organization: No    Attends meetings of clubs or organizations: Never    Relationship status: Married  Other Topics Concern  . Not on file  Social History Narrative  . Not on file    Allergies:  Allergies  Allergen Reactions  . Adipex-P [Phentermine] Shortness Of Breath, Swelling, Palpitations and Hypertension    Tunnel vision, chest pain, headache, and feet became swollen  . Codeine Nausea And  Vomiting  . Adhesive [Tape] Rash    Patient prefers paper tape    Metabolic Disorder Labs: No results found for: HGBA1C, MPG No results found for: PROLACTIN No results found for: CHOL, TRIG, HDL, CHOLHDL, VLDL, LDLCALC Lab Results  Component Value Date   TSH 2.61 08/15/2013    Therapeutic Level Labs: No results found for: LITHIUM No results found for: VALPROATE No components found for:  CBMZ  Current Medications: Current Outpatient Medications  Medication Sig Dispense Refill  . Aspirin-Acetaminophen-Caffeine (GOODY HEADACHE PO) Take 1 packet by mouth once as needed (for headaches).    . Dexlansoprazole (DEXILANT) 30 MG capsule Take 30 mg by mouth daily.    . Ferrous Sulfate (IRON) 28 MG TABS Take 28 mg by mouth daily.    . fluticasone (FLONASE) 50 MCG/ACT nasal spray Place 1 spray into both nostrils daily.   12  .  ibuprofen (ADVIL,MOTRIN) 800 MG tablet Take 800 mg by mouth every 6 (six) hours as needed (for pain or migraines).    . Lactobacillus Rhamnosus, GG, ([employer] PROBIOTIC, LACTOBACILLUS,) CAPS Take 1 capsule by mouth daily.    . meclizine (ANTIVERT) 25 MG tablet Take 25 mg by mouth 3 (three) times daily as needed (for vertigo).   0  . Multiple Vitamins-Minerals (ONE-A-DAY WOMENS VITACRAVES) CHEW Chew 2 tablets by mouth daily.    . ondansetron (ZOFRAN ODT) 4 MG disintegrating tablet Take 1 tablet (4 mg total) by mouth every 6 (six) hours as needed for nausea or vomiting. 20 tablet 0  . pantoprazole (PROTONIX) 40 MG tablet Take 40 mg by mouth daily before breakfast.     . promethazine (PHENERGAN) 25 MG tablet Take 1 tablet (25 mg total) by mouth every 6 (six) hours as needed for nausea or vomiting. 20 tablet 0  . propranolol (INDERAL) 10 MG tablet Take 1 tablet (10 mg total) by mouth 2 (two) times daily as needed. For severe anxiety sx (Patient taking differently: Take 10 mg by mouth 2 (two) times daily as needed (for severe anxiety symptoms). ) 180 tablet 0  . traZODone (DESYREL)  100 MG tablet Take 1 tablet (100 mg total) by mouth at bedtime. 90 tablet 0  . triamterene-hydrochlorothiazide (DYAZIDE) 37.5-25 MG capsule Take 1 capsule by mouth daily.  5  . VENTOLIN HFA 108 (90 Base) MCG/ACT inhaler Inhale 2 puffs into the lungs every 6 (six) hours as needed for wheezing or shortness of breath.     . clonazePAM (KLONOPIN) 0.5 MG tablet Take 1 tablet (0.5 mg total) by mouth as directed. Take it only up to 3 times a week for severe anxiety symptoms only 15 tablet 0  . DULoxetine (CYMBALTA) 30 MG capsule Take 1 capsule (30 mg total) by mouth daily. 30 capsule 0   No current facility-administered medications for this visit.      Musculoskeletal: Strength & Muscle Tone: within normal limits Gait & Station: normal Patient leans: N/A  Psychiatric Specialty Exam: Review of Systems  Psychiatric/Behavioral: Positive for depression. The patient is nervous/anxious and has insomnia.   All other systems reviewed and are negative.   Blood pressure 108/72, pulse 75, temperature 98.1 F (36.7 C), temperature source Oral, weight 203 lb 9.6 oz (92.4 kg), unknown if currently breastfeeding.Body mass index is 36.65 kg/m.  General Appearance: Casual  Eye Contact:  Fair  Speech:  Clear and Coherent  Volume:  Normal  Mood:  Anxious and Dysphoric  Affect:  Congruent  Thought Process:  Goal Directed and Descriptions of Associations: Intact  Orientation:  Full (Time, Place, and Person)  Thought Content: Logical   Suicidal Thoughts:  No does have chronic SI on and off , states she will not act on it and denies it now  Homicidal Thoughts:  No  Memory:  Immediate;   Fair Recent;   Fair Remote;   Fair  Judgement:  Fair  Insight:  Fair  Psychomotor Activity:  Normal  Concentration:  Concentration: Fair and Attention Span: Fair  Recall:  FiservFair  Fund of Knowledge: Fair  Language: Fair  Akathisia:  No  Handed:  Right  AIMS (if indicated): na  Assets:  Communication Skills Desire for  Improvement Social Support  ADL's:  Intact  Cognition: WNL  Sleep:  Poor   Screenings:   Assessment and Plan: [place] is a 27 year old Caucasian female who has a history of depression, anxiety, presented to the clinic today  for a follow-up visit.  Patient continues to struggle with anxiety, depressive symptoms and sleep problems.  Patient is noncompliant with her medications as well as psychotherapy sessions.  Patient is here today with her husband who provided collateral information.  Patient reports she is motivated to restart medications as well as pursue more intensive psychotherapy visits.  Discussed with patient she can transition to a new therapist Adelina Mings if she is having scheduling conflict with Joni Reining.  Patient agrees with plan.  Plan Depression Discontinue Prozac for noncompliance and side effects Start Cymbalta 30 mg p.o. daily Refer for CBT on a weekly basis.  Also discussed IOP with patient.   Insomnia Restart trazodone 100 mg p.o. nightly.  She reports it helped when she was taking it.  For anxiety disorder Add Klonopin 0.5 mg as needed only for severe anxiety attacks, discussed with patient to take it as often as up to 3 times a week and not more than that.  I have reviewed [place] controlled substance database.  Patient informed about the risk of being on long-term benzodiazepine therapy especially given her family history of substance abuse. Continue propranolol 10 mg p.o. twice daily as needed Cymbalta 30 mg p.o. daily  Patient will start more intensive psychotherapy sessions.  Crisis plan discussed with patient as well as Sonya [name] - husband ,who will monitor patient closely for worsening symptoms of depression and anxiety.  Also discussed removing the gun from the home and keeping it safe and away from the patient.  Patient's husband Sonya [name] agreed to do so.  Pt provided with information for genesight testing.  Follow-up in clinic in 1 week or sooner if needed.  More than  50 % of the time was spent for psychoeducation and supportive psychotherapy and care coordination.  This note was generated in part or whole with voice recognition software. Voice recognition is usually quite accurate but there are transcription errors that can and very often do occur. I apologize for any typographical errors that were not detected and corrected.        Jomarie Longs, MD 11/28/2017, 2:27 PM

## 2017-11-28 NOTE — Patient Instructions (Addendum)
Duloxetine delayed-release capsules What is this medicine? DULOXETINE (doo LOX e teen) is used to treat depression, anxiety, and different types of chronic pain. This medicine may be used for other purposes; ask your health care provider or pharmacist if you have questions. COMMON BRAND NAME(S): Cymbalta, Irenka What should I tell my health care provider before I take this medicine? They need to know if you have any of these conditions: -bipolar disorder or a family history of bipolar disorder -glaucoma -kidney disease -liver disease -suicidal thoughts or a previous suicide attempt -taken medicines called MAOIs like Carbex, Eldepryl, Marplan, Nardil, and Parnate within 14 days -an unusual reaction to duloxetine, other medicines, foods, dyes, or preservatives -pregnant or trying to get pregnant -breast-feeding How should I use this medicine? Take this medicine by mouth with a glass of water. Follow the directions on the prescription label. Do not cut, crush or chew this medicine. You can take this medicine with or without food. Take your medicine at regular intervals. Do not take your medicine more often than directed. Do not stop taking this medicine suddenly except upon the advice of your doctor. Stopping this medicine too quickly may cause serious side effects or your condition may worsen. A special MedGuide will be given to you by the pharmacist with each prescription and refill. Be sure to read this information carefully each time. Talk to your pediatrician regarding the use of this medicine in children. While this drug may be prescribed for children as young as 7 years of age for selected conditions, precautions do apply. Overdosage: If you think you have taken too much of this medicine contact a poison control center or emergency room at once. NOTE: This medicine is only for you. Do not share this medicine with others. What if I miss a dose? If you miss a dose, take it as soon as you  can. If it is almost time for your next dose, take only that dose. Do not take double or extra doses. What may interact with this medicine? Do not take this medicine with any of the following medications: -desvenlafaxine -levomilnacipran -linezolid -MAOIs like Carbex, Eldepryl, Marplan, Nardil, and Parnate -methylene blue (injected into a vein) -milnacipran -thioridazine -venlafaxine This medicine may also interact with the following medications: -alcohol -amphetamines -aspirin and aspirin-like medicines -certain antibiotics like ciprofloxacin and enoxacin -certain medicines for blood pressure, heart disease, irregular heart beat -certain medicines for depression, anxiety, or psychotic disturbances -certain medicines for migraine headache like almotriptan, eletriptan, frovatriptan, naratriptan, rizatriptan, sumatriptan, zolmitriptan -certain medicines that treat or prevent blood clots like warfarin, enoxaparin, and dalteparin -cimetidine -fentanyl -lithium -NSAIDS, medicines for pain and inflammation, like ibuprofen or naproxen -phentermine -procarbazine -rasagiline -sibutramine -St. John's wort -theophylline -tramadol -tryptophan This list may not describe all possible interactions. Give your health care provider a list of all the medicines, herbs, non-prescription drugs, or dietary supplements you use. Also tell them if you smoke, drink alcohol, or use illegal drugs. Some items may interact with your medicine. What should I watch for while using this medicine? Tell your doctor if your symptoms do not get better or if they get worse. Visit your doctor or health care professional for regular checks on your progress. Because it may take several weeks to see the full effects of this medicine, it is important to continue your treatment as prescribed by your doctor. Patients and their families should watch out for new or worsening thoughts of suicide or depression. Also watch out for  sudden changes in   feelings such as feeling anxious, agitated, panicky, irritable, hostile, aggressive, impulsive, severely restless, overly excited and hyperactive, or not being able to sleep. If this happens, especially at the beginning of treatment or after a change in dose, call your health care professional. Sonya [name] may get drowsy or dizzy. Do not drive, use machinery, or do anything that needs mental alertness until you know how this medicine affects you. Do not stand or sit up quickly, especially if you are an older patient. This reduces the risk of dizzy or fainting spells. Alcohol may interfere with the effect of this medicine. Avoid alcoholic drinks. This medicine can cause an increase in blood pressure. This medicine can also cause a sudden drop in your blood pressure, which may make you feel faint and increase the chance of a fall. These effects are most common when you first start the medicine or when the dose is increased, or during use of other medicines that can cause a sudden drop in blood pressure. Check with your doctor for instructions on monitoring your blood pressure while taking this medicine. Your mouth may get dry. Chewing sugarless gum or sucking hard candy, and drinking plenty of water may help. Contact your doctor if the problem does not go away or is severe. What side effects may I notice from receiving this medicine? Side effects that you should report to your doctor or health care professional as soon as possible: -allergic reactions like skin rash, itching or hives, swelling of the face, lips, or tongue -anxious -breathing problems -confusion -changes in vision -chest pain -confusion -elevated mood, decreased need for sleep, racing thoughts, impulsive behavior -eye pain -fast, irregular heartbeat -feeling faint or lightheaded, falls -feeling agitated, angry, or irritable -hallucination, loss of contact with reality -high blood pressure -loss of balance or  coordination -palpitations -redness, blistering, peeling or loosening of the skin, including inside the mouth -restlessness, pacing, inability to keep still -seizures -stiff muscles -suicidal thoughts or other mood changes -trouble passing urine or change in the amount of urine -trouble sleeping -unusual bleeding or bruising -unusually weak or tired -vomiting -yellowing of the eyes or skin Side effects that usually do not require medical attention (report to your doctor or health care professional if they continue or are bothersome): -change in sex drive or performance -change in appetite or weight -constipation -dizziness -dry mouth -headache -increased sweating -nausea -tired This list may not describe all possible side effects. Call your doctor for medical advice about side effects. You may report side effects to FDA at 1-800-FDA-1088. Where should I keep my medicine? Keep out of the reach of children. Store at room temperature between 20 and 25 degrees C (68 to 77 degrees F). Throw away any unused medicine after the expiration date. NOTE: This sheet is a summary. It may not cover all possible information. If you have questions about this medicine, talk to your doctor, pharmacist, or health care provider.  2018 Elsevier/Gold Standard (2015-07-23 18:16:03) Clonazepam tablets What is this medicine? CLONAZEPAM (kloe NA ze pam) is a benzodiazepine. It is used to treat certain types of seizures. It is also used to treat panic disorder. This medicine may be used for other purposes; ask your health care provider or pharmacist if you have questions. COMMON BRAND NAME(S): Ceberclon, Klonopin What should I tell my health care provider before I take this medicine? They need to know if you have any of these conditions: -an alcohol or drug abuse problem -bipolar disorder, depression, psychosis or other mental health condition -  glaucoma -kidney or liver disease -lung or breathing  disease -myasthenia gravis -Parkinson's disease -porphyria -seizures or a history of seizures -suicidal thoughts -an unusual or allergic reaction to clonazepam, other benzodiazepines, foods, dyes, or preservatives -pregnant or trying to get pregnant -breast-feeding How should I use this medicine? Take this medicine by mouth with a glass of water. Follow the directions on the prescription label. If it upsets your stomach, take it with food or milk. Take your medicine at regular intervals. Do not take it more often than directed. Do not stop taking or change the dose except on the advice of your doctor or health care professional. A special MedGuide will be given to you by the pharmacist with each prescription and refill. Be sure to read this information carefully each time. Talk to your pediatrician regarding the use of this medicine in children. Special care may be needed. Overdosage: If you think you have taken too much of this medicine contact a poison control center or emergency room at once. NOTE: This medicine is only for you. Do not share this medicine with others. What if I miss a dose? If you miss a dose, take it as soon as you can. If it is almost time for your next dose, take only that dose. Do not take double or extra doses. What may interact with this medicine? Do not take this medication with any of the following medicines: -narcotic medicines for cough -sodium oxybate This medicine may also interact with the following medications: -alcohol -antihistamines for allergy, cough and cold -antiviral medicines for HIV or AIDS -certain medicines for anxiety or sleep -certain medicines for depression, like amitriptyline, fluoxetine, sertraline -certain medicines for fungal infections like ketoconazole and itraconazole -certain medicines for seizures like carbamazepine, phenobarbital, phenytoin, primidone -general anesthetics like halothane, isoflurane, methoxyflurane,  propofol -local anesthetics like lidocaine, pramoxine, tetracaine -medicines that relax muscles for surgery -narcotic medicines for pain -phenothiazines like chlorpromazine, mesoridazine, prochlorperazine, thioridazine This list may not describe all possible interactions. Give your health care provider a list of all the medicines, herbs, non-prescription drugs, or dietary supplements you use. Also tell them if you smoke, drink alcohol, or use illegal drugs. Some items may interact with your medicine. What should I watch for while using this medicine? Tell your doctor or health care professional if your symptoms do not start to get better or if they get worse. Do not stop taking except on your doctor's advice. You may develop a severe reaction. Your doctor will tell you how much medicine to take. You may get drowsy or dizzy. Do not drive, use machinery, or do anything that needs mental alertness until you know how this medicine affects you. To reduce the risk of dizzy and fainting spells, do not stand or sit up quickly, especially if you are an older patient. Alcohol may increase dizziness and drowsiness. Avoid alcoholic drinks. If you are taking another medicine that also causes drowsiness, you may have more side effects. Give your health care provider a list of all medicines you use. Your doctor will tell you how much medicine to take. Do not take more medicine than directed. Call emergency for help if you have problems breathing or unusual sleepiness. The use of this medicine may increase the chance of suicidal thoughts or actions. Pay special attention to how you are responding while on this medicine. Any worsening of mood, or thoughts of suicide or dying should be reported to your health care professional right away. What side effects may I  notice from receiving this medicine? Side effects that you should report to your doctor or health care professional as soon as possible: -allergic reactions  like skin rash, itching or hives, swelling of the face, lips, or tongue -breathing problems -confusion -loss of balance or coordination -signs and symptoms of low blood pressure like dizziness; feeling faint or lightheaded, falls; unusually weak or tired -suicidal thoughts or mood changes Side effects that usually do not require medical attention (report to your doctor or health care professional if they continue or are bothersome): -dizziness -headache -tiredness -upset stomach This list may not describe all possible side effects. Call your doctor for medical advice about side effects. You may report side effects to FDA at 1-800-FDA-1088. Where should I keep my medicine? Keep out of the reach of children. This medicine can be abused. Keep your medicine in a safe place to protect it from theft. Do not share this medicine with anyone. Selling or giving away this medicine is dangerous and against the law. This medicine may cause accidental overdose and death if taken by other adults, children, or pets. Mix any unused medicine with a substance like cat litter or coffee grounds. Then throw the medicine away in a sealed container like a sealed bag or a coffee can with a lid. Do not use the medicine after the expiration date. Store at room temperature between 15 and 30 degrees C (59 and 86 degrees F). Protect from light. Keep container tightly closed. NOTE: This sheet is a summary. It may not cover all possible information. If you have questions about this medicine, talk to your doctor, pharmacist, or health care provider.  2018 Elsevier/Gold Standard (2015-07-31 18:46:32)   Crisis Hotline [employer] Numbers  [facility] - [phone]  National Suicide Hotline - 1800-273-TALK or 1-800-SUICIDE  Crisis Text Line - Text HOME to 727-061-0875741741  Crisis chat - Lifeline Chat   911

## 2017-11-29 ENCOUNTER — Encounter: Payer: Self-pay | Admitting: Physical Therapy

## 2017-11-29 ENCOUNTER — Telehealth (HOSPITAL_COMMUNITY): Payer: Self-pay | Admitting: Psychiatry

## 2017-11-29 NOTE — Telephone Encounter (Signed)
D:  Sonya Rod, LCSW referred pt to [facility].  A:  Placed call to pt to orient her.  Pt declined at this time stating that she would like to see her therapist weekly first.  Informed Joni Reining.  R:  Pt receptive.

## 2017-12-05 ENCOUNTER — Ambulatory Visit: Payer: No Typology Code available for payment source | Admitting: Psychiatry

## 2017-12-06 ENCOUNTER — Ambulatory Visit: Payer: No Typology Code available for payment source | Attending: Gastroenterology | Admitting: Physical Therapy

## 2017-12-06 ENCOUNTER — Ambulatory Visit: Payer: No Typology Code available for payment source | Admitting: Licensed Clinical Social Worker

## 2017-12-06 DIAGNOSIS — M791 Myalgia, unspecified site: Secondary | ICD-10-CM | POA: Diagnosis present

## 2017-12-06 DIAGNOSIS — M533 Sacrococcygeal disorders, not elsewhere classified: Secondary | ICD-10-CM

## 2017-12-06 DIAGNOSIS — M2170 Unequal limb length (acquired), unspecified site: Secondary | ICD-10-CM | POA: Diagnosis present

## 2017-12-06 NOTE — Therapy (Addendum)
[place] SERVICES [address] [place], [place], [address] Phone: [phone]   Fax:  [phone]  Physical Therapy Treatment  Patient Details  Name: Sonya [name] MRN: [id number] Date of Birth: 1990-05-04 Referring Provider (PT): Octavia Bruckner, MD   Encounter Date: 12/06/2017  PT End of Session - 12/06/17 1151    Visit Number  4    Number of Visits  12    Date for PT Re-Evaluation  01/25/18    PT Start Time  1110    PT Stop Time  1151    PT Time Calculation (min)  41 min    Activity Tolerance  Patient tolerated treatment well;No increased pain    Behavior During Therapy  WFL for tasks assessed/performed       Past Medical History:  Diagnosis Date  . Anxiety   . Depression   . Meniere disease UNKNOWN  . Vertigo     Past Surgical History:  Procedure Laterality Date  . COLONOSCOPY WITH PROPOFOL N/A 10/25/2017   Procedure: COLONOSCOPY WITH PROPOFOL;  Surgeon: Toledo, Benay Pike, MD;  Location: [facility] ENDOSCOPY;  Service: Gastroenterology;  Laterality: N/A;  . ESOPHAGOGASTRODUODENOSCOPY (EGD) WITH PROPOFOL N/A 10/25/2017   Procedure: ESOPHAGOGASTRODUODENOSCOPY (EGD) WITH PROPOFOL;  Surgeon: Toledo, Benay Pike, MD;  Location: [facility] ENDOSCOPY;  Service: Gastroenterology;  Laterality: N/A;  . WISDOM TOOTH EXTRACTION    . WRIST SURGERY      There were no vitals filed for this visit.  Subjective Assessment - 12/06/17 1115    Subjective  Pt continues to have no sciatic Sx and also has had any fecal incontinence. When she got sick with stomach virus with diarrhea, she had very little fecal leakage. With her sinus infection, she notices no urinary leakage with coughing and sneezing. Pt has recovered from her sicknesses.      Pertinent History  Begining of August, pt had 2 visits tot he ER due to dehydration for one visit and E. coli for another. Pt completed her treatment for E. coli. Pt had a colonscopy and endoscopy last week and she  has not been informed yet about biopsies. Pt had these tests done for GERD and no sphincter tone.  Pt is due for f/u with GI doctor 11/13/17.                      Pelvic Floor Special Questions - 12/06/17 1146    Pelvic Floor Internal Exam  pt consented verablly without contraindications     Exam Type  Vaginal    Palpation  significant scar at 4-6 o'clock 2nd-3rd layers, increased tightness at R iliococcogyeus    Strength # of reps  --   initially delayed lengthening, ab overuse       [facility] Adult PT Treatment/Exercise - 12/06/17 1148      Neuro Re-ed    Neuro Re-ed Details   see pt instructions       Moist Heat Therapy   Number Minutes Moist Heat  5 Minutes    Moist Heat Location  Other (comment)   perineum through pillow case, and sheet     Manual Therapy   Internal Pelvic Floor  scar releases at perineal scar and R iliococcgyeus STM with MWM                  PT Long Term Goals - 12/06/17 1154      PT LONG TERM GOAL #1   Title  Pt will  report decreased frequency of fecal leakage from  60-70% of the time after bowel movements, passing gas to <25% of the time in order to perform ADLs     Time  8    Period  Weeks    Status  Achieved      PT LONG TERM GOAL #2   Title  Pt will report no leakage 100% of the time across one week in order to improve QOL     Time  12    Period  Weeks    Status  On-going      PT LONG TERM GOAL #3   Title  Pt will demo equal pelvic alignment across 2 visits  with compliance wearing shoe lift in R shoe in order to promote improved back pain and pelvic function to progress to strengthening and stretching exercises     Time  4    Period  Weeks    Status  Achieved      PT LONG TERM GOAL #4   Title  Pt will demo no pelvic floor tensions, proper pelvic floor coordination in order to minimize urinary and fecal leakage.    Time  6    Period  Weeks    Status  Partially Met      PT LONG TERM GOAL #5   Title  Pt will decrease  her PFDI score from 46% to < 36% in order to imp rove pelvic floor function    Time  12    Period  Weeks    Status  On-going      PT LONG TERM GOAL #6   Title  Pt will decrease PDI score  14% to < 7% in order to improve QOL     Time  12    Period  Weeks    Status  On-going            Plan - 12/06/17 1151    Clinical Impression Statement  Pt is having no more sciatic pain for the past month and resolving leakage with feces and urine. Addressed perineal scar with intravaginal techniques which pt tolerated with change in palpation pressure and mobilization with movement techniques. Reinforced hip abduction HEP. Pt continues to benefit from skilled PT     Rehab Potential  Good    PT Frequency  1x / week    PT Duration  12 weeks    PT Treatment/Interventions  Gait training;Stair training;Neuromuscular re-education;Functional mobility training;Moist Heat;Biofeedback;Traction;Therapeutic activities;Therapeutic exercise;Balance training;Patient/family education;Manual techniques;Scar mobilization;Taping    Consulted and Agree with Plan of Care  Patient       Patient will benefit from skilled therapeutic intervention in order to improve the following deficits and impairments:  Improper body mechanics, Pain, Postural dysfunction, Increased muscle spasms, Decreased mobility, Decreased scar mobility, Decreased coordination, Decreased activity tolerance, Decreased safety awareness, Impaired flexibility, Decreased range of motion, Decreased endurance, Decreased strength, Hypomobility  Visit Diagnosis: Sacrococcygeal disorders, not elsewhere classified  Leg length discrepancy  Myalgia     Problem List Patient Active Problem List   Diagnosis Date Noted  . Indication for care in labor and delivery, delivered 12/26/2014  . Indication for care or intervention related to labor and delivery 12/23/2014  . Labor and delivery, indication for care 10/13/2014    Jerl Mina ,PT, DPT,  E-RYT  12/06/2017, 11:55 AM  [facility] [address] [place], [place], [address] Phone: [phone]   Fax:  [phone]  Name: Sonya [name] MRN: [id number] Date of Birth: 03-17-1990

## 2017-12-12 NOTE — Progress Notes (Signed)
   THERAPIST PROGRESS NOTE  Session Time: 49mn  Participation Level: Active  Behavioral Response: CasualAlertDepressed  Type of Therapy: Individual Therapy  Treatment Goals addressed: Coping and Diagnosis: Depression  Interventions: CBT  Summary: Sonya HGearingis a 27y.o. female who presents with continued symptoms of her diagnosis. Therapist met with Patient in an outpatient setting to assess current mood and assist with making progress towards goals through the use of therapeutic intervention. Therapist did a brief mood check, assessing anger, fear, disgust, excitement, happiness, and sadness.  Patient reports that she is "sad."  Patient was able to express her thoughts about her current relationship.  Patient reports that her relationship is volatile and she wants out of it. Reviewed with Patient her expectations out of her marriage and reasons why she cheated. Discussion on her symptoms and the frequency of her symptoms.  Informed Patient of her need of a higher level of care.  [place]  Advised her to rescheduled after completion of IOP   Suicidal/Homicidal: No  Plan: Return again in 1 weeks.  Diagnosis: Axis I: Depression    Axis II: No diagnosis    NLubertha South LCSW 11/28/2017

## 2017-12-13 ENCOUNTER — Ambulatory Visit: Payer: No Typology Code available for payment source | Admitting: Physical Therapy

## 2017-12-13 ENCOUNTER — Telehealth (HOSPITAL_COMMUNITY): Payer: Self-pay | Admitting: Professional

## 2017-12-13 DIAGNOSIS — M791 Myalgia, unspecified site: Secondary | ICD-10-CM

## 2017-12-13 DIAGNOSIS — M533 Sacrococcygeal disorders, not elsewhere classified: Secondary | ICD-10-CM

## 2017-12-13 DIAGNOSIS — M2170 Unequal limb length (acquired), unspecified site: Secondary | ICD-10-CM

## 2017-12-13 NOTE — Patient Instructions (Addendum)
Handout bladder irritant   Start with a tumbler of water 40 fl oz / day and then gradually to 2 x day  Decrease one soda/ tea  ___________  Stand equally on both legs to avoid pelvic malalignment and tight R low back    To stretch the R low back :     Reclined twist for hips and side of the hips/ legs  Lay on your back, knees bend Scoot hips to the R , leave shoulders in place Drop knees to the L side resting onto pillows to keep leg at the same width of hips Pillow under L thigh to minimize too much strain

## 2017-12-13 NOTE — Therapy (Signed)
[facility] MAIN [facility] SERVICES [address] [place], [place], [address] Phone: [phone]   Fax:  [phone]  Physical Therapy Treatment / Discharge Summary   Patient Details  Name: Sonya [name] MRN: [id number] Date of Birth: October 27, 1990 Referring Provider (PT): Jacob Moores, MD   Encounter Date: 12/13/2017  PT End of Session - 12/13/17 1119    Visit Number  5    Number of Visits  12    Date for PT Re-Evaluation  01/25/18    PT Start Time  1117    PT Stop Time  1215    PT Time Calculation (min)  58 min    Activity Tolerance  Patient tolerated treatment well;No increased pain    Behavior During Therapy  WFL for tasks assessed/performed       Past Medical History:  Diagnosis Date  . Anxiety   . Depression   . Meniere disease UNKNOWN  . Vertigo     Past Surgical History:  Procedure Laterality Date  . COLONOSCOPY WITH PROPOFOL N/A 10/25/2017   Procedure: COLONOSCOPY WITH PROPOFOL;  Surgeon: Toledo, Boykin Nearing, MD;  Location: [facility] ENDOSCOPY;  Service: Gastroenterology;  Laterality: N/A;  . ESOPHAGOGASTRODUODENOSCOPY (EGD) WITH PROPOFOL N/A 10/25/2017   Procedure: ESOPHAGOGASTRODUODENOSCOPY (EGD) WITH PROPOFOL;  Surgeon: Toledo, Boykin Nearing, MD;  Location: [facility] ENDOSCOPY;  Service: Gastroenterology;  Laterality: N/A;  . WISDOM TOOTH EXTRACTION    . WRIST SURGERY      There were no vitals filed for this visit.  Subjective Assessment - 12/13/17 1120    Subjective  Pt feels frequency and has had urinary leakage a couple of days ago. Pt is not drinking water, and mostly drinks sodas and teas. Pt has had stressors related work and going through a divorce. Pt is seeking EACP counseling.      Pertinent History  Begining of August, pt had 2 visits tot he ER due to dehydration for one visit and E. coli for another. Pt completed her treatment for E. coli. Pt had a colonscopy and endoscopy last week and she has not been informed yet about biopsies. Pt  had these tests done for GERD and no sphincter tone.  Pt is due for f/u with GI doctor 11/13/17.           [facility] PT Assessment - 12/13/17 1137      Palpation   SI assessment   L ASIS slightly higher.  leg length: 81 cm R, 82 cm L  ( post Tx at  R QL 82 cm B)      Palpation comment  R QL tightness, particularly at iliac crest attachments                   [facility] Adult PT Treatment/Exercise - 12/13/17 1231      Neuro Re-ed    Neuro Re-ed Details   see pt instructions       Moist Heat Therapy   Number Minutes Moist Heat  5 Minutes    Moist Heat Location  Lumbar Spine;Other (comment)      Manual Therapy   Manual therapy comments  STM/ lower trunk rotation to release R QL tightness              PT Education - 12/13/17 1211    Education Details  HEP, d/c     Person(s) Educated  Patient    Methods  Explanation;Demonstration;Tactile cues;Verbal cues;Handout    Comprehension  Returned demonstration;Verbalized understanding  PT Long Term Goals - 12/13/17 1127      PT LONG TERM GOAL #1   Title  Pt will report decreased frequency of fecal leakage from  60-70% of the time after bowel movements, passing gas to <25% of the time in order to perform ADLs     Time  8    Period  Weeks    Status  Achieved      PT LONG TERM GOAL #2   Title  Pt will report no leakage 100% of the time across one week in order to improve QOL     Time  12    Period  Weeks    Status  On-going      PT LONG TERM GOAL #3   Title  Pt will demo equal pelvic alignment across 2 visits  with compliance wearing shoe lift in R shoe in order to promote improved back pain and pelvic function to progress to strengthening and stretching exercises     Time  4    Period  Weeks    Status  Achieved      PT LONG TERM GOAL #4   Title  Pt will demo no pelvic floor tensions, proper pelvic floor coordination in order to minimize urinary and fecal leakage.    Time  6    Period  Weeks    Status   Achieved      PT LONG TERM GOAL #5   Title  Pt will decrease her PFDI score from 46% to < 36% in order to imp rove pelvic floor function  ( 10/9: 6%)     Time  12    Period  Weeks    Status  Achieved      PT LONG TERM GOAL #6   Title  Pt will decrease PDI score  14% to < 7% in order to improve QOL   ( 10/9: 4%)     Time  12    Period  Weeks    Status  Achieved            Plan - 12/13/17 1224    Clinical Impression Statement Pt has achieved 5/6 goals across the past 5 visits.  Pt's sciatic Sx and fecal incontinence resolved. Pt's Pelvic Floor Disorder score decreased significantly from 46% to 6%, indicating less urinary and fecal leakage. Her Pain Disability Index decreased from 14% to 4% with return to daily functional activities including sexual activity without pain. Across the past visits, intravaginal Tx helped to decrease pt's perineal scar restrictions from her 4th degree tear during the L& D of her dtr 2 years ago. In combination with neuro-muscular re-education, pt demo'd proper ROM of pelvic floor and coordination. Pt demo'd a leg length discrepancy and pelvic obliquities but these deficits are not structural and more due to R quadratus lumborum tightness 2/2 to pt's preferred stance w/ weight bearing on one leg more than the other.  Pt was advised to discontinue wearing her shoe lift and to plant both legs equally with less hyperextended knees with standing and to maintain stretches.  Hip strengthening exercises have been included into her HEP. Today addressed pt's R LBP complaint located at her R PSIS. Manual Tx decreased tightenss of her QL . Pt reported LBP improved by 65% .  Pt reported she has had increased urinary leakage with UTI -like Sx for the past couple of days. Recommended pt to f/u with PCP to test for UTI.  Pt was tearful when  expressing due to new stressors in her life and reported she will be seeking psychiatric Tx. Pt was provided active listening and support. Pt is  d/c at this time.      Rehab Potential  Good    PT Frequency  1x / week    PT Duration  12 weeks    PT Treatment/Interventions  Gait training;Stair training;Neuromuscular re-education;Functional mobility training;Moist Heat;Biofeedback;Traction;Therapeutic activities;Therapeutic exercise;Balance training;Patient/family education;Manual techniques;Scar mobilization;Taping    Consulted and Agree with Plan of Care  Patient       Patient will benefit from skilled therapeutic intervention in order to improve the following deficits and impairments:  Improper body mechanics, Pain, Postural dysfunction, Increased muscle spasms, Decreased mobility, Decreased scar mobility, Decreased coordination, Decreased activity tolerance, Decreased safety awareness, Impaired flexibility, Decreased range of motion, Decreased endurance, Decreased strength, Hypomobility  Visit Diagnosis: Sacrococcygeal disorders, not elsewhere classified  Leg length discrepancy  Myalgia     Problem List Patient Active Problem List   Diagnosis Date Noted  . Indication for care in labor and delivery, delivered 12/26/2014  . Indication for care or intervention related to labor and delivery 12/23/2014  . Labor and delivery, indication for care 10/13/2014    Mariane Masters ,PT, DPT, E-RYT  12/13/2017, 12:32 PM  [facility] MAIN [facility] SERVICES [address] [place], [place], [address] Phone: [phone]   Fax:  [phone]  Name: Sonya [name] MRN: [id number] Date of Birth: 06-May-1990

## 2017-12-14 ENCOUNTER — Other Ambulatory Visit (HOSPITAL_COMMUNITY)
Payer: No Typology Code available for payment source | Attending: Psychiatry | Admitting: Licensed Clinical Social Worker

## 2017-12-14 DIAGNOSIS — F17210 Nicotine dependence, cigarettes, uncomplicated: Secondary | ICD-10-CM | POA: Insufficient documentation

## 2017-12-14 DIAGNOSIS — Z79899 Other long term (current) drug therapy: Secondary | ICD-10-CM | POA: Diagnosis not present

## 2017-12-14 DIAGNOSIS — Z7951 Long term (current) use of inhaled steroids: Secondary | ICD-10-CM | POA: Insufficient documentation

## 2017-12-14 DIAGNOSIS — K219 Gastro-esophageal reflux disease without esophagitis: Secondary | ICD-10-CM | POA: Insufficient documentation

## 2017-12-14 DIAGNOSIS — F419 Anxiety disorder, unspecified: Secondary | ICD-10-CM | POA: Insufficient documentation

## 2017-12-14 DIAGNOSIS — Z818 Family history of other mental and behavioral disorders: Secondary | ICD-10-CM | POA: Diagnosis not present

## 2017-12-14 DIAGNOSIS — F332 Major depressive disorder, recurrent severe without psychotic features: Secondary | ICD-10-CM

## 2017-12-14 DIAGNOSIS — F331 Major depressive disorder, recurrent, moderate: Secondary | ICD-10-CM | POA: Diagnosis not present
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
## 2017-12-16 ENCOUNTER — Encounter: Payer: Self-pay | Admitting: Emergency Medicine

## 2017-12-16 ENCOUNTER — Telehealth: Payer: No Typology Code available for payment source | Admitting: Nurse Practitioner

## 2017-12-16 ENCOUNTER — Other Ambulatory Visit: Payer: Self-pay

## 2017-12-16 ENCOUNTER — Emergency Department
Admission: EM | Admit: 2017-12-16 | Discharge: 2017-12-17 | Disposition: A | Discharge status: Home / Self Care | Payer: No Typology Code available for payment source | Attending: Emergency Medicine | Admitting: Emergency Medicine

## 2017-12-16 DIAGNOSIS — Y939 Activity, unspecified: Secondary | ICD-10-CM | POA: Insufficient documentation

## 2017-12-16 DIAGNOSIS — N3000 Acute cystitis without hematuria: Secondary | ICD-10-CM

## 2017-12-16 DIAGNOSIS — F419 Anxiety disorder, unspecified: Secondary | ICD-10-CM | POA: Insufficient documentation

## 2017-12-16 DIAGNOSIS — Y929 Unspecified place or not applicable: Secondary | ICD-10-CM | POA: Insufficient documentation

## 2017-12-16 DIAGNOSIS — Z79899 Other long term (current) drug therapy: Secondary | ICD-10-CM | POA: Insufficient documentation

## 2017-12-16 DIAGNOSIS — Z87891 Personal history of nicotine dependence: Secondary | ICD-10-CM | POA: Insufficient documentation

## 2017-12-16 DIAGNOSIS — Y999 Unspecified external cause status: Secondary | ICD-10-CM | POA: Diagnosis not present

## 2017-12-16 DIAGNOSIS — X58XXXA Exposure to other specified factors, initial encounter: Secondary | ICD-10-CM | POA: Insufficient documentation

## 2017-12-16 DIAGNOSIS — T50901A Poisoning by unspecified drugs, medicaments and biological substances, accidental (unintentional), initial encounter: Secondary | ICD-10-CM

## 2017-12-16 DIAGNOSIS — T447X1A Poisoning by beta-adrenoreceptor antagonists, accidental (unintentional), initial encounter: Secondary | ICD-10-CM | POA: Diagnosis not present

## 2017-12-16 DIAGNOSIS — F410 Panic disorder [episodic paroxysmal anxiety] without agoraphobia: Secondary | ICD-10-CM | POA: Insufficient documentation

## 2017-12-16 LAB — COMPREHENSIVE METABOLIC PANEL
ALBUMIN: 4.4 g/dL (ref 3.5–5.0)
ALT: 29 U/L (ref 0–44)
AST: 30 U/L (ref 15–41)
Alkaline Phosphatase: 61 U/L (ref 38–126)
Anion gap: 10 (ref 5–15)
BILIRUBIN TOTAL: 0.3 mg/dL (ref 0.3–1.2)
BUN: 8 mg/dL (ref 6–20)
CHLORIDE: 105 mmol/L (ref 98–111)
CO2: 22 mmol/L (ref 22–32)
Calcium: 9.5 mg/dL (ref 8.9–10.3)
Creatinine, Ser: 0.54 mg/dL (ref 0.44–1.00)
GFR calc Af Amer: >60 mL/min (ref >60)
GFR calc non Af Amer: >60 mL/min (ref >60)
GLUCOSE: 106 mg/dL — AB (ref 70–99)
POTASSIUM: 3.9 mmol/L (ref 3.5–5.1)
Sodium: 137 mmol/L (ref 135–145)
TOTAL PROTEIN: 8.8 g/dL — AB (ref 6.5–8.1)

## 2017-12-16 LAB — CBC WITH DIFFERENTIAL/PLATELET
ABS IMMATURE GRANULOCYTES: 0.07 10*3/uL (ref 0.00–0.07)
Basophils Absolute: 0.1 10*3/uL (ref 0.0–0.1)
Basophils Relative: 1 %
Eosinophils Absolute: 0.3 10*3/uL (ref 0.0–0.5)
Eosinophils Relative: 2 %
HEMATOCRIT: 41.1 % (ref 36.0–46.0)
Hemoglobin: 13.4 g/dL (ref 12.0–15.0)
IMMATURE GRANULOCYTES: 1 %
LYMPHS ABS: 4.6 10*3/uL — AB (ref 0.7–4.0)
Lymphocytes Relative: 32 %
MCH: 27 pg (ref 26.0–34.0)
MCHC: 32.6 g/dL (ref 30.0–36.0)
MCV: 82.9 fL (ref 80.0–100.0)
MONOS PCT: 5 %
Monocytes Absolute: 0.7 10*3/uL (ref 0.1–1.0)
NEUTROS ABS: 8.7 10*3/uL — AB (ref 1.7–7.7)
NEUTROS PCT: 59 %
Platelets: 398 10*3/uL (ref 150–400)
RBC: 4.96 MIL/uL (ref 3.87–5.11)
RDW: 13.4 % (ref 11.5–15.5)
WBC: 14.3 10*3/uL — ABNORMAL HIGH (ref 4.0–10.5)
nRBC: 0 % (ref 0.0–0.2)

## 2017-12-16 LAB — ACETAMINOPHEN LEVEL: Acetaminophen (Tylenol), Serum: <10 ug/mL — ABNORMAL LOW (ref 10–30)

## 2017-12-16 LAB — SALICYLATE LEVEL: Salicylate Lvl: <7 mg/dL (ref 2.8–30.0)

## 2017-12-16 LAB — ETHANOL: Alcohol, Ethyl (B): <10 mg/dL (ref <10)

## 2017-12-16 MED ORDER — FLUCONAZOLE 150 MG PO TABS: 150.0000 mg | ORAL_TABLET | Freq: Once | ORAL | 0 refills | Status: AC

## 2017-12-16 MED ORDER — NITROFURANTOIN MONOHYD MACRO 100 MG PO CAPS: 100.0000 mg | ORAL_CAPSULE | Freq: Two times a day (BID) | ORAL | 0 refills | Status: DC

## 2017-12-16 NOTE — ED Notes (Signed)
This tech in triage room to complete EKG when pt states to this tech "I think I feel a lot better now, I don't need to stay, at first I was really worried but now I think I'm fine" this tech advised pt to stay and speak with RN Viviann Spare that triaged her to better provide care for her. Pt agrees and is sitting calmly in triage room awaiting return for RN Viviann Spare, RN made aware by this Clinical research associate

## 2017-12-16 NOTE — ED Triage Notes (Signed)
Pt to ED from home c/o taking between 10-15 10mg  propranolol at home as needed for anxiety.  States anxiety attack and it was not getting better and so patient states kept taking medication starting around 2130 tonight.  Denies SI/HI, denies SOB.  States some chest and left arm pain while driving here that resolved quickly, also nausea and vomiting x1 at home.  Pt contacted poison control at home and advised to come to ED.  Presents A&Ox4, chest rise even and unlabored.

## 2017-12-16 NOTE — ED Notes (Addendum)
RN spoke with Sonya [name] at poison control. Recommended to perform an EKG. Monitor pt for bradycardia, hypotention,  QRS elongation, decreased urine output and elevating lactate. IF bradycardia or hypotension noted pt will most likely not respond to fluid bolus.   Glucogon can be given if needed and bedside echo recommended if provider concerned for worsening symptoms.

## 2017-12-16 NOTE — Progress Notes (Signed)
We are sorry that you are not feeling well.  Here is how we plan to help!  Based on what you shared with me it looks like you most likely have a simple urinary tract infection.  A UTI (Urinary Tract Infection) is a bacterial infection of the bladder.  Most cases of urinary tract infections are simple to treat but a key part of your care is to encourage you to drink plenty of fluids and watch your symptoms carefully.  I have prescribed MacroBid 100 mg twice a day for 5 days.  Your symptoms should gradually improve. Call us if the burning in your urine worsens, you develop worsening fever, back pain or pelvic pain or if your symptoms do not resolve after completing the antibiotic. I also sent in diflucan as requested.  Urinary tract infections can be prevented by drinking plenty of water to keep your body hydrated.  Also be sure when you wipe, wipe from front to back and don't hold it in!  If possible, empty your bladder every 4 hours.  Your e-visit answers were reviewed by a board certified advanced clinical practitioner to complete your personal care plan.  Depending on the condition, your plan could have included both over the counter or prescription medications.  If there is a problem please reply  once you have received a response from your provider.  Your safety is important to us.  If you have drug allergies check your prescription carefully.    You can use MyChart to ask questions about today's visit, request a non-urgent call back, or ask for a work or school excuse for 24 hours related to this e-Visit. If it has been greater than 24 hours you will need to follow up with your provider, or enter a new e-Visit to address those concerns.   You will get an e-mail in the next two days asking about your experience.  I hope that your e-visit has been valuable and will speed your recovery. Thank you for using e-visits.    

## 2017-12-16 NOTE — ED Provider Notes (Signed)
[facility] Emergency Department Provider Note  ____________________________________________   First MD Initiated Contact with Patient 12/16/17 2303     (approximate)  I have reviewed the triage vital signs and the nursing notes.   HISTORY  Chief Complaint Drug Overdose   HPI Sonya [name] is a 27 y.o. female who self presents to the emergency department after ingesting 15 to 2010 mg tablets of propranolol beginning at 9:30 PM roughly 90 minutes prior to my evaluation.  She says she has a long-standing history of anxiety for which she is prescribed both Klonopin and propranolol.  She had a panic attack today and took 1 tablet propranolol which did not seem to work so she took another and then another and then another.  She says she was not trying to hurt herself she just "did not realize what I was doing" and wanted the panic attack to stop.  She denies suicidal or homicidal ideation.  She immediately called poison control when she realized what she had done and they advised her to come to the emergency department.  She did vomit after taking the pills and is not sure how many she actually kept down.  Her symptoms came on gradually were severe and have slowly improved with time.  Her symptoms are worsened by interpersonal conflict and stress.    Past Medical History:  Diagnosis Date  . Anxiety   . Depression   . Meniere disease UNKNOWN  . Vertigo     Patient Active Problem List   Diagnosis Date Noted  . Indication for care in labor and delivery, delivered 12/26/2014  . Indication for care or intervention related to labor and delivery 12/23/2014  . Labor and delivery, indication for care 10/13/2014    Past Surgical History:  Procedure Laterality Date  . COLONOSCOPY WITH PROPOFOL N/A 10/25/2017   Procedure: COLONOSCOPY WITH PROPOFOL;  Surgeon: Toledo, Boykin Nearing, MD;  Location: [facility] ENDOSCOPY;  Service: Gastroenterology;  Laterality: N/A;  .  ESOPHAGOGASTRODUODENOSCOPY (EGD) WITH PROPOFOL N/A 10/25/2017   Procedure: ESOPHAGOGASTRODUODENOSCOPY (EGD) WITH PROPOFOL;  Surgeon: Toledo, Boykin Nearing, MD;  Location: [facility] ENDOSCOPY;  Service: Gastroenterology;  Laterality: N/A;  . WISDOM TOOTH EXTRACTION    . WRIST SURGERY      Prior to Admission medications   Medication Sig Start Date End Date Taking? Authorizing Provider  Aspirin-Acetaminophen-Caffeine (GOODY HEADACHE PO) Take 1 packet by mouth once as needed (for headaches).    [provider]  clonazePAM (KLONOPIN) 0.5 MG tablet Take 1 tablet (0.5 mg total) by mouth as directed. Take it only up to 3 times a week for severe anxiety symptoms only 11/28/17   Jomarie Longs, MD  Dexlansoprazole (DEXILANT) 30 MG capsule Take 30 mg by mouth daily.    [provider]  DULoxetine (CYMBALTA) 30 MG capsule Take 1 capsule (30 mg total) by mouth daily. 11/28/17   Jomarie Longs, MD  Ferrous Sulfate (IRON) 28 MG TABS Take 28 mg by mouth daily.    [provider]  fluticasone (FLONASE) 50 MCG/ACT nasal spray Place 1 spray into both nostrils daily.  09/08/17   [provider]  ibuprofen (ADVIL,MOTRIN) 800 MG tablet Take 800 mg by mouth every 6 (six) hours as needed (for pain or migraines).    [provider]  Lactobacillus Rhamnosus, GG, ([employer] PROBIOTIC, LACTOBACILLUS,) CAPS Take 1 capsule by mouth daily.    [provider]  meclizine (ANTIVERT) 25 MG tablet Take 25 mg by mouth 3 (three) times daily as  needed (for vertigo).  09/08/17   [provider]  Multiple Vitamins-Minerals (ONE-A-DAY WOMENS VITACRAVES) CHEW Chew 2 tablets by mouth daily.    [provider]  nitrofurantoin, macrocrystal-monohydrate, (MACROBID) 100 MG capsule Take 1 capsule (100 mg total) by mouth 2 (two) times daily. 1 po BId 12/16/17   Daphine Deutscher, Mary-Margaret, FNP  ondansetron (ZOFRAN ODT) 4 MG disintegrating tablet Take 1 tablet (4 mg total) by mouth every 6 (six) hours  as needed for nausea or vomiting. 10/07/17   Evon Slack, PA-C  pantoprazole (PROTONIX) 40 MG tablet Take 40 mg by mouth daily before breakfast.     [provider]  promethazine (PHENERGAN) 25 MG tablet Take 1 tablet (25 mg total) by mouth every 6 (six) hours as needed for nausea or vomiting. 10/08/17   Irean Hong, MD  propranolol (INDERAL) 10 MG tablet Take 1 tablet (10 mg total) by mouth 2 (two) times daily as needed. For severe anxiety sx Patient taking differently: Take 10 mg by mouth 2 (two) times daily as needed (for severe anxiety symptoms).  08/17/17   Jomarie Longs, MD  traZODone (DESYREL) 100 MG tablet Take 1 tablet (100 mg total) by mouth at bedtime. 11/28/17   Jomarie Longs, MD  triamterene-hydrochlorothiazide (DYAZIDE) 37.5-25 MG capsule Take 1 capsule by mouth daily. 01/18/17   [provider]  VENTOLIN HFA 108 (90 Base) MCG/ACT inhaler Inhale 2 puffs into the lungs every 6 (six) hours as needed for wheezing or shortness of breath.  09/15/17   [provider]    Allergies Adipex-p [phentermine]; Codeine; and Adhesive [tape]  Family History  Problem Relation Age of Onset  . Graves' disease Mother   . Drug abuse Father   . Drug abuse Sister   . Depression Brother     Social History Social History   Tobacco Use  . Smoking status: Former Games developer  . Smokeless tobacco: Never Used  Substance Use Topics  . Alcohol use: No  . Drug use: No    Review of Systems Constitutional: No fever/chills Eyes: No visual changes. ENT: No sore throat. Cardiovascular: Denies chest pain. Respiratory: Denies shortness of breath. Gastrointestinal: No abdominal pain.  Positive for nausea, positive for vomiting.  No diarrhea.  No constipation. Genitourinary: Negative for dysuria. Musculoskeletal: Negative for back pain. Skin: Negative for rash. Neurological: Negative for headaches, focal weakness or  numbness.   ____________________________________________   PHYSICAL EXAM:  VITAL SIGNS: ED Triage Vitals  Enc Vitals Group     BP 12/16/17 2228 (!) 120/55     Pulse Rate 12/16/17 2228 89     Resp 12/16/17 2228 16     Temp 12/16/17 2228 98.4 F (36.9 C)     Temp Source 12/16/17 2228 Oral     SpO2 12/16/17 2228 97 %     Weight 12/16/17 2227 205 lb (93 kg)     Height 12/16/17 2227 5\' 3"  (1.6 m)     Head Circumference --      Peak Flow --      Pain Score 12/16/17 2234 0     Pain Loc --      Pain Edu? --      Excl. in GC? --     Constitutional: Alert and oriented x4 pleasant cooperative speaks in full clear sentences no diaphoresis Eyes: PERRL EOMI. midrange and brisk Head: Atraumatic. Nose: No congestion/rhinnorhea. Mouth/Throat: No trismus Neck: No stridor.   Cardiovascular: Normal rate, regular rhythm. Grossly normal heart sounds.  Good  peripheral circulation. Respiratory: Normal respiratory effort.  No retractions. Lungs CTAB and moving good air Gastrointestinal: Soft nontender Musculoskeletal: No lower extremity edema   Neurologic:  Normal speech and language. No gross focal neurologic deficits are appreciated. Skin:  Skin is warm, dry and intact. No rash noted. Psychiatric: Mood and affect are normal. Speech and behavior are normal.    ____________________________________________   DIFFERENTIAL includes but not limited to  Unintentional overdose, intentional overdose, suicide attempt, depression, panic attack ____________________________________________   LABS (all labs ordered are listed, but only abnormal results are displayed)  Labs Reviewed  ACETAMINOPHEN LEVEL - Abnormal; Notable for the following components:      Result Value   Acetaminophen (Tylenol), Serum <10 (*)    All other components within normal limits  COMPREHENSIVE METABOLIC PANEL - Abnormal; Notable for the following components:   Glucose, Bld 106 (*)    Total Protein 8.8 (*)    All  other components within normal limits  CBC WITH DIFFERENTIAL/PLATELET - Abnormal; Notable for the following components:   WBC 14.3 (*)    Neutro Abs 8.7 (*)    Lymphs Abs 4.6 (*)    All other components within normal limits  ETHANOL  SALICYLATE LEVEL  HCG, QUANTITATIVE, PREGNANCY    Lab work reviewed by me with no acute disease noted.  Elevated white count likely secondary to stress __________________________________________  EKG  ED ECG REPORT I, Merrily Brittle, the attending physician, personally viewed and interpreted this ECG.  Date: 12/17/2017 EKG Time:  Rate: 83 Rhythm: normal sinus rhythm QRS Axis: normal Intervals: normal ST/T Wave abnormalities: normal Narrative Interpretation: no evidence of acute ischemia  ____________________________________________  RADIOLOGY   ____________________________________________   PROCEDURES  Procedure(s) performed: No  Procedures  Critical Care performed: no  ____________________________________________   INITIAL IMPRESSION / ASSESSMENT AND PLAN / ED COURSE  Pertinent labs & imaging results that were available during my care of the patient were reviewed by me and considered in my medical decision making (see chart for details).   As part of my medical decision making, I reviewed the following data within the electronic MEDICAL RECORD NUMBER History obtained from family if available, nursing notes, old chart and ekg, as well as notes from prior ED visits.  The patient comes to the emergency department after an unintentional propranolol overdose.  I discussed with [employer] control who indicated that even the high end of her ingestion 200 mg is considered a relatively low dose propranolol ingestion.  They do recommend 6 hours of observation from the initial ingestion so 3:30 AM.  The patient is expressing some desire to go home and poison control felt that if I were comfortable with this being a truly unintentional overdose  that that would actually be reasonable.  The patient does consent to getting her blood drawn and staying on monitor until its back.     ----------------------------------------- 12:24 AM on 12/17/2017 -----------------------------------------  The patient has been on monitor for an hour and a half with no ectopy and her heart rate remains in the 80s with no bradycardia.  Lab work is unremarkable.  I recommended and offered observation for another 3 hours however she declined stating she would prefer to go home which I think is reasonable at this time.  She contracts for safety.  Strict return precautions have been given.  She feels welcome to return to the emergency department at any point for continued work-up and evaluation. ____________________________________________   FINAL CLINICAL IMPRESSION(S) / ED  DIAGNOSES  Final diagnoses:  Acute drug overdose, accidental or unintentional, initial encounter      NEW MEDICATIONS STARTED DURING THIS VISIT:  New Prescriptions   No medications on file     Note:  This document was prepared using Dragon voice recognition software and may include unintentional dictation errors.     Merrily Brittle, MD 12/17/17 [id number]

## 2017-12-17 LAB — HCG, QUANTITATIVE, PREGNANCY: hCG, Beta Chain, Quant, S: <1 m[IU]/mL (ref <5)

## 2017-12-17 NOTE — Discharge Instructions (Signed)
Please follow-up with both your primary care physician and your therapist as scheduled and return to the emergency department for any concerns.  It was a pleasure to take care of you today, and thank you for coming to our emergency department.  If you have any questions or concerns before leaving please ask the nurse to grab me and I'm more than happy to go through your aftercare instructions again.  If you were prescribed any opioid pain medication today such as Norco, Vicodin, Percocet, morphine, hydrocodone, or oxycodone please make sure you do not drive when you are taking this medication as it can alter your ability to drive safely.  If you have any concerns once you are home that you are not improving or are in fact getting worse before you can make it to your follow-up appointment, please do not hesitate to call 911 and come back for further evaluation.  Merrily Brittle, MD  Results for orders placed or performed during the hospital encounter of 12/16/17  Acetaminophen level  Result Value Ref Range   Acetaminophen (Tylenol), Serum <10 (L) 10 - 30 ug/mL  Comprehensive metabolic panel  Result Value Ref Range   Sodium 137 135 - 145 mmol/L   Potassium 3.9 3.5 - 5.1 mmol/L   Chloride 105 98 - 111 mmol/L   CO2 22 22 - 32 mmol/L   Glucose, Bld 106 (H) 70 - 99 mg/dL   BUN 8 6 - 20 mg/dL   Creatinine, Ser 2.13 0.44 - 1.00 mg/dL   Calcium 9.5 8.9 - 08.6 mg/dL   Total Protein 8.8 (H) 6.5 - 8.1 g/dL   Albumin 4.4 3.5 - 5.0 g/dL   AST 30 15 - 41 U/L   ALT 29 0 - 44 U/L   Alkaline Phosphatase 61 38 - 126 U/L   Total Bilirubin 0.3 0.3 - 1.2 mg/dL   GFR calc non Af Amer >60 >60 mL/min   GFR calc Af Amer >60 >60 mL/min   Anion gap 10 5 - 15  Ethanol  Result Value Ref Range   Alcohol, Ethyl (B) <10 <10 mg/dL  CBC with Differential  Result Value Ref Range   WBC 14.3 (H) 4.0 - 10.5 K/uL   RBC 4.96 3.87 - 5.11 MIL/uL   Hemoglobin 13.4 12.0 - 15.0 g/dL   HCT 57.8 46.9 - 62.9 %   MCV 82.9  80.0 - 100.0 fL   MCH 27.0 26.0 - 34.0 pg   MCHC 32.6 30.0 - 36.0 g/dL   RDW 52.8 41.3 - 24.4 %   Platelets 398 150 - 400 K/uL   nRBC 0.0 0.0 - 0.2 %   Neutrophils Relative % 59 %   Neutro Abs 8.7 (H) 1.7 - 7.7 K/uL   Lymphocytes Relative 32 %   Lymphs Abs 4.6 (H) 0.7 - 4.0 K/uL   Monocytes Relative 5 %   Monocytes Absolute 0.7 0.1 - 1.0 K/uL   Eosinophils Relative 2 %   Eosinophils Absolute 0.3 0.0 - 0.5 K/uL   Basophils Relative 1 %   Basophils Absolute 0.1 0.0 - 0.1 K/uL   Immature Granulocytes 1 %   Abs Immature Granulocytes 0.07 0.00 - 0.07 K/uL  Salicylate level  Result Value Ref Range   Salicylate Lvl <7.0 2.8 - 30.0 mg/dL  hCG, quantitative, pregnancy  Result Value Ref Range   hCG, Beta Chain, Quant, S <1 <5 mIU/mL

## 2017-12-18 ENCOUNTER — Encounter (HOSPITAL_COMMUNITY): Payer: Self-pay | Admitting: Occupational Therapy

## 2017-12-18 ENCOUNTER — Other Ambulatory Visit (HOSPITAL_COMMUNITY): Payer: No Typology Code available for payment source | Admitting: Occupational Therapy

## 2017-12-18 ENCOUNTER — Other Ambulatory Visit (HOSPITAL_COMMUNITY): Payer: No Typology Code available for payment source | Admitting: Licensed Clinical Social Worker

## 2017-12-18 ENCOUNTER — Other Ambulatory Visit: Payer: Self-pay

## 2017-12-18 ENCOUNTER — Encounter (HOSPITAL_COMMUNITY): Payer: Self-pay

## 2017-12-18 VITALS — BP 116/74 | HR 96 | Ht 62.0 in | Wt 207.0 lb

## 2017-12-18 DIAGNOSIS — R4589 Other symptoms and signs involving emotional state: Secondary | ICD-10-CM

## 2017-12-18 DIAGNOSIS — F332 Major depressive disorder, recurrent severe without psychotic features: Secondary | ICD-10-CM

## 2017-12-18 DIAGNOSIS — F331 Major depressive disorder, recurrent, moderate: Secondary | ICD-10-CM | POA: Diagnosis not present

## 2017-12-18 DIAGNOSIS — F070 Personality change due to known physiological condition: Secondary | ICD-10-CM

## 2017-12-18 NOTE — Addendum Note (Signed)
Addended by: Jobe Gibbon on: 12/18/2017 01:29 PM   Modules accepted: Orders

## 2017-12-18 NOTE — Therapy (Addendum)
[place], [place], [address] Phone: [phone]   Fax:  [phone]  Occupational Therapy Evaluation  Patient Details  Name: Sonya [name] MRN: [id number] Date of Birth: 1990-03-10 Referring Provider (OT): Ricky Ala, NP   Encounter Date: 12/18/2017  OT End of Session - 12/18/17 1241    Visit Number  1    Number of Visits  16    Date for OT Re-Evaluation  01/15/18    Authorization Type  [employer] employee    OT Start Time  1030    OT Stop Time  1200    OT Time Calculation (min)  90 min    Activity Tolerance  Patient tolerated treatment well    Behavior During Therapy  [facility] for tasks assessed/performed       Past Medical History:  Diagnosis Date  . Anxiety   . Depression   . Meniere disease UNKNOWN  . Vertigo     Past Surgical History:  Procedure Laterality Date  . COLONOSCOPY WITH PROPOFOL N/A 10/25/2017   Procedure: COLONOSCOPY WITH PROPOFOL;  Surgeon: Toledo, Benay Pike, MD;  Location: [facility] ENDOSCOPY;  Service: Gastroenterology;  Laterality: N/A;  . ESOPHAGOGASTRODUODENOSCOPY (EGD) WITH PROPOFOL N/A 10/25/2017   Procedure: ESOPHAGOGASTRODUODENOSCOPY (EGD) WITH PROPOFOL;  Surgeon: Toledo, Benay Pike, MD;  Location: [facility] ENDOSCOPY;  Service: Gastroenterology;  Laterality: N/A;  . WISDOM TOOTH EXTRACTION    . WRIST SURGERY      There were no vitals filed for this visit.  Subjective Assessment - 12/18/17 1234    Currently in Pain?  No/denies        [facility] OT Assessment - 12/18/17 0001      Assessment   Medical Diagnosis  Severe episode of major depressive disorder, without psychotic features    Referring Provider (OT)  Ricky Ala, NP    Onset Date/Surgical Date  12/18/17      Precautions   Precautions  None      Restrictions   Weight Bearing Restrictions  No      Balance Screen   Has the patient fallen in the past 6 months  No    Has the patient had a decrease in  activity level because of a fear of falling?   No    Is the patient reluctant to leave their home because of a fear of falling?   No         OT assessment: OCAIRS  Diagnosis: Severe episode of major depressive disorder, recurrent, severe without psychotic features  Past medical history/referral information: Pt presents to PHP after escalating signs of MDD/impulsiveness and life stressors form psychiatrist. Pt reports to evaluation by presenting personal information within minutes of meeting OT, very labile. Per chart review, pt in ED past weekend for overdose. Pt shares it was purely "accidental", counselors informed and aware of situation for safety. Pt frequently questioning "are you going to send me to the psych hospital". Pt reports she has stopped taking her anti depressants bc it has affected her ability to reach sexual gratification, and mentioned 2-3 times throughout evaluation without prompting or mentions of medications from OT.   Living situation: Pt currently lives with "ex husband" and 19 year old daughter. Pt shares that her "ex husband" and her have been separated for a month but still live together because they cannot afford the legal process that comes with divorce.  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 Pt victimizes self for behavior that lead to dismissal.  Leisure: Pt shares she has no leisure activities, and with significant cueing mentions music  Social support: Shares her mother and daughter as her only supports. Pt has been impulsively having sexual relations with men she has met on the internet.  Struggles: emotional regulation, stress management, impulse control  OCAIRS Mental Health Interview Summary of Client Scores:  FACILITATES PARTICIPATION IN OCCUPATION  ALLOWS PARTICIPATION IN OCCUPATION INHIBITS PARTICIPATION IN OCCUPATION RESTRICTS PARTICIPATION IN  OCCUPATION COMMENTS  ROLES                  X  Recently fired from position, recently separated from husband  HABITS                   X No structured routine after work, disorganized and impulsive  PERSONAL CAUSATION                   X "I am a failure, I know I'm not good at anything"  VALUES                  X    INTERESTS                   X Cannot identify without significant cueing  SKILLS                   X Increased difficulty with concentrating, problem solving, making decisions. Pt reports her STM is affected  SHORT TERM GOALS                   X "I guess not committing suicide but I have failed at every other goal I have set"  LONG TERM GOALS                   X   INTERPETATION OF PAST EXPERIENCES                   X Increased time to think of best period, very vague from several years ago. Focuses on bad and says she's had mostly bad times  PHYSICAL ENVIRONMENT                  X    SOCIAL ENVIRONMENT                   X Unhealthy romantic relationships, no friends   READINESS FOR CHANGE     Reports needing routine, does not have one and stopped taking medicine because of this. Also reports having poor time management skills and needing routine to function     Need for Occupational Therapy:  4 Shows positive occupational participation, no need for OT.   3 Need for minimal intervention/consultative participation   2 Need for OT intervention indicated to restore/improve participation    X 1 Need for extensive OT intervention indicated to improve participation.  Referral for follow up services also recommended.    Assessment:  Patient demonstrates behavior that restricts participation in occupation.  Patient will benefit from occupational therapy intervention in order to improve time management, financial management, stress management, job readiness skills, social skills, and health management skills in preparation to return to full time community living and to be a productive  community member.    Plan:  Patient will participate in skilled occupational therapy sessions individually or in a group setting  to improve coping skills, psychosocial skills, and emotional skills required to return to prior level of function. Treatment will be 4-5 times per week for 3 weeks.     OT TREATMENT- Strength exploration  S: "My strengths are logic and self awareness"  O: Education given on the importance of utilizing personal strengths to the advantage in life. Pt to circle from a list of strengths they believe to possess, with the opportunity to write their own strengths. Pt then to discuss how strengths have been an advantage in past situations, and how different strengths can be of benefit in the future. This past vs future model was applied to explore relationships, professional life, and personal fulfillment. Pt encouraged to share throughout session and reflect with other group members.  A: Pt presents to group with labile affect, tearful then cheerful throughout session/day. Pt identified strengths as "logic and self awareness", challenged pt to consider these strengths further, pt still believing them to be true. Pt needing significant cueing and direction to complete remainder of activity, pt eventually just writing "none" in the blanks on the worksheet and not engaging.   P: OT will continue to follow up to ensure knowledge of strengths is applied affectively when developing psychosocial skills for community reintegration.            OT Education - 12/18/17 1238    Education Details  education given on strength exploration    Person(s) Educated  Patient    Methods  Explanation;Handout    Comprehension  Verbalized understanding       OT Short Term Goals - 12/18/17 1245      OT SHORT TERM GOAL #1   Title  Pt will be educated on strategies to improve psychosocial skills needed to participate fully in all daily, work, and leisure activities    Time  4    Period   Weeks    Status  New    Target Date  01/15/18      OT SHORT TERM GOAL #2   Title  Pt will apply psychosocial skills and coping mechansims to daily activities in order to function independently and reintegrate into community dwelling    Time  4    Period  Weeks    Status  New      OT SHORT TERM GOAL #3   Title  Pt will recall and apply 1-3 sleep hygiene strategies to improve engagement in BADL routine    Time  4    Period  Weeks      OT SHORT TERM GOAL #4   Title  Pt will recall and implement 1-3 time management strategies to improve function in IADL routine      OT SHORT TERM GOAL #5   Title  Pt will recall and apply 3-5 stress management strategies for application into communitty reintegration    Time  4    Period  Weeks               Plan - 12/18/17 1243    Occupational performance deficits (Please refer to evaluation for details):  ADL's;IADL's;Rest and Sleep;Education;Work;Leisure;Social Participation    Rehab Potential  Good    OT Frequency  4x / week    OT Duration  4 weeks    OT Treatment/Interventions  Psychosocial skills training;Coping strategies training;Self-care/ADL training;Other (comment)   community reintegration      Patient will benefit from skilled therapeutic intervention in order to improve the following deficits and impairments:  Decreased coping  skills, Decreased psychosocial skills, Other (comment)(decreased ability to engage in BADL and reintegrate into community)  Visit Diagnosis: Organic personality disorder  Difficulty coping    Problem List Patient Active Problem List   Diagnosis Date Noted  . Indication for care in labor and delivery, delivered 12/26/2014  . Indication for care or intervention related to labor and delivery 12/23/2014  . Labor and delivery, indication for care 10/13/2014   Zenovia Jarred, MSOT, OTR/L [place] OT/ Acute Relief OT  Zenovia Jarred 12/18/2017, 12:50 PM  [place], [place], [address] Phone: [phone]   Fax:  [phone]  Name: Shayna Eblen MRN: [id number] Date of Birth: 09/16/1990

## 2017-12-18 NOTE — Progress Notes (Signed)
Patient presents with appropriate affect, depressed mood and admits to periodic thoughts of suicide over the past few weeks.  States she lost her job with [employer] one week prior and has a long history of failures with antidepressant medications.  Patient denies any current suicidal or homicidal ideations, no auditory or visual hallucinations and no plan or intent to harm self or others at this time.  Patient reported SSRI and SNRI antidepressants have only made her "not feel" in the past and particularly stated this about Prozac and Lexapro.  Patient reported she is currently not taking any medications for depression and reports "my mood has been all over the place".  Patient questions if she may be more Bipolar with symptoms and admits to some increased promiscuity over the past few months. States her husband and her are not together but still living together and have been together for more than 10 years.  Reports they have a 71 year old daughter but she felt she had the perfect life but just was not happy.  Patient used examples of not doing more things as a young person and states she questions if she is just being selfish and "extreme guilt" for deciding she does not want to be married any longer.  States she has been completely honest with her husband and while he is supportive and understanding at this time, thinks his actions will be short lived.  States she thinks the way her father treated her mother and how she has always been subservient in her own relationship, is not the way she wants her daughter to be raised.  Patient does report thinking PHP can be helpful and will see NP on 12/19/17 to discuss medication history.  Patient scored a 21 on her PHQ9 depression screening and rated her current level of depression a 3, anxiety a 2, and hopelessness a 10 on a scale of 0-10 with 0 being none and 10 the worst she could manage.  States she is open to PHP being helpful and with sharing with group members and  again denies any current suicidal thoughts, plans or intent.  Reports she "wants to find herself" with groups and to be able to move forward with future plans and decisions for her current relationship.  Patient educated about safety with admitted recent sexual activities and states as present only interested in one other person and understands concerns. Patient to contact this nurse or PHP staff if any thoughts of wanting to harm self or others of if problems with any medications.

## 2017-12-19 ENCOUNTER — Encounter (HOSPITAL_COMMUNITY): Payer: Self-pay | Admitting: Occupational Therapy

## 2017-12-19 ENCOUNTER — Other Ambulatory Visit (HOSPITAL_COMMUNITY): Payer: No Typology Code available for payment source | Admitting: Licensed Clinical Social Worker

## 2017-12-19 ENCOUNTER — Other Ambulatory Visit (HOSPITAL_COMMUNITY): Payer: No Typology Code available for payment source | Admitting: Occupational Therapy

## 2017-12-19 ENCOUNTER — Encounter (HOSPITAL_COMMUNITY): Payer: Self-pay | Admitting: Family

## 2017-12-19 DIAGNOSIS — F070 Personality change due to known physiological condition: Secondary | ICD-10-CM

## 2017-12-19 DIAGNOSIS — F331 Major depressive disorder, recurrent, moderate: Secondary | ICD-10-CM | POA: Diagnosis not present

## 2017-12-19 DIAGNOSIS — R4589 Other symptoms and signs involving emotional state: Secondary | ICD-10-CM

## 2017-12-19 NOTE — Progress Notes (Signed)
Behavioral Health Partial Program Assessment Note  Date: 12/19/2017 Name: Sonya [name] MRN: [id number]   HPI: Sonya [name] is a 27 y.o. Caucasian female presents with reported mood irritably and anxiety.  Reports she was followed by MD Eappen however reports she was dismissed from the practice due to multiple no calls and no show appointments.  Reports she has tried multiple antidepressants in the past however states she has recently stopped taking her Prozac due to sexual side effects.  Reports she feels she is allergic to SSRIs and SSRIs and is not interested in taking any medication at this time.  Reports she feels her mood has been getting increasingly worse as she reports more supposed restless.  States she was recently fired from her job due to absences.  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  Reports her husband is supportive.  However she states that she is not interested in being with only one person at this time.  Patient denies suicidal or homicidal ideations.  Denies auditory visual hallucinations.  Denies paranoia or depression symptoms.  Patient reports a family history of mental illness: Father; deceased reported substance abuse and depression.  Brother; (28) reports schizoaffective, depression anxiety and bipolar.  Sister: reports bipolar.  Sonya [name].  Reports a 67-year-old daughter.  Patient was enrolled in partial psychiatric program on 12/19/17.  Will not initiate any medications at this time.  Consider DBT referral and resources.  Primary complaints include: poor concentration, problem with medication and stressed at work.  Onset of symptoms was gradual  with gradually worsening course since that time. Psychosocial Stressors include the following: family, financial, marital and occupational.   I have reviewed the following documentation dated : past psychiatric  history  Complaints of Pain: nonear Past Psychiatric History:  Past psychiatric hospitalizations. Report 1 previous inpatient admisson, Previous suicide attempts and Past medication trials Cymbalta, Prozac, Lexapro, Abilify, Pristiq, trazodone  Currently in treatment with reports she recently stopped taking Prozac about 2 months ago.  Substance Abuse History: alcohol Use of Alcohol: occasional, social use 5 Use of Caffeine: denies use Use of over the counter:   Past Surgical History:  Procedure Laterality Date  . COLONOSCOPY WITH PROPOFOL N/A 10/25/2017   Procedure: COLONOSCOPY WITH PROPOFOL;  Surgeon: Toledo, Boykin Nearing, MD;  Location: [facility] ENDOSCOPY;  Service: Gastroenterology;  Laterality: N/A;  . ESOPHAGOGASTRODUODENOSCOPY (EGD) WITH PROPOFOL N/A 10/25/2017   Procedure: ESOPHAGOGASTRODUODENOSCOPY (EGD) WITH PROPOFOL;  Surgeon: Toledo, Boykin Nearing, MD;  Location: [facility] ENDOSCOPY;  Service: Gastroenterology;  Laterality: N/A;  . WISDOM TOOTH EXTRACTION    . WRIST SURGERY      Past Medical History:  Diagnosis Date  . Anxiety   . Depression   . GERD (gastroesophageal reflux disease) 10/2017  . Meniere disease UNKNOWN  . Vertigo    Outpatient Encounter Medications as of 12/19/2017  Medication Sig Note  . Aspirin-Acetaminophen-Caffeine (GOODY HEADACHE PO) Take 1 packet by mouth once as needed (for headaches). 12/18/2017: Takes only as needed.   . clonazePAM (KLONOPIN) 0.5 MG tablet Take 1 tablet (0.5 mg total) by mouth as directed. Take it only up to 3 times a week for severe anxiety symptoms only 12/18/2017: Reports takes once a day.   Marland Kitchen Dexlansoprazole (DEXILANT) 30 MG capsule Take 30 mg by mouth daily. 12/18/2017: Not taking as reports cannot afford.  . DULoxetine (CYMBALTA) 30 MG capsule Take 1 capsule (30 mg total) by  mouth daily. (Patient not taking: Reported on 12/18/2017)   . Ferrous Sulfate (IRON) 28 MG TABS Take 28 mg by mouth daily.   . fluticasone (FLONASE) 50 MCG/ACT nasal  spray Place 1 spray into both nostrils daily.  12/18/2017: Takes only as needed.   . Homeopathic Products (AVENOC EX) Apply topically.   Marland Kitchen ibuprofen (ADVIL,MOTRIN) 800 MG tablet Take 800 mg by mouth every 6 (six) hours as needed (for pain or migraines).   . Lactobacillus Rhamnosus, GG, ([employer] PROBIOTIC, LACTOBACILLUS,) CAPS Take 1 capsule by mouth daily. 12/18/2017: Takes only as needed.   . meclizine (ANTIVERT) 25 MG tablet Take 25 mg by mouth 3 (three) times daily as needed (for vertigo).    . Multiple Vitamins-Minerals (ONE-A-DAY WOMENS VITACRAVES) CHEW Chew 2 tablets by mouth daily.   . nitrofurantoin, macrocrystal-monohydrate, (MACROBID) 100 MG capsule Take 1 capsule (100 mg total) by mouth 2 (two) times daily. 1 po BId   . ondansetron (ZOFRAN ODT) 4 MG disintegrating tablet Take 1 tablet (4 mg total) by mouth every 6 (six) hours as needed for nausea or vomiting.   . pantoprazole (PROTONIX) 40 MG tablet Take 40 mg by mouth daily before breakfast.    . promethazine (PHENERGAN) 25 MG tablet Take 1 tablet (25 mg total) by mouth every 6 (six) hours as needed for nausea or vomiting. (Patient not taking: Reported on 12/18/2017)   . propranolol (INDERAL) 10 MG tablet Take 1 tablet (10 mg total) by mouth 2 (two) times daily as needed. For severe anxiety sx (Patient not taking: Reported on 12/18/2017)   . traZODone (DESYREL) 100 MG tablet Take 1 tablet (100 mg total) by mouth at bedtime.   . triamterene-hydrochlorothiazide (DYAZIDE) 37.5-25 MG capsule Take 1 capsule by mouth daily.   . VENTOLIN HFA 108 (90 Base) MCG/ACT inhaler Inhale 2 puffs into the lungs every 6 (six) hours as needed for wheezing or shortness of breath.     No facility-administered encounter medications on file as of 12/19/2017.    Allergies  Allergen Reactions  . Adipex-P [Phentermine] Shortness Of Breath, Swelling, Palpitations and Hypertension    Tunnel vision, chest pain, headache, and feet became swollen  . Codeine Nausea And  Vomiting  . Adhesive [Tape] Rash    Patient prefers paper tape    Social History   Tobacco Use  . Smoking status: Light Tobacco Smoker    Types: Cigarettes  . Smokeless tobacco: Never Used  . Tobacco comment: Reports recenlty smoked a few when she returned to the hospital   Substance Use Topics  . 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 Graves' disease Mother   . Physical abuse Mother   . Drug abuse Father   . Alcohol abuse Father   . Seizures Father   . Drug abuse Sister   . Anxiety disorder Sister   . Depression Brother   . Anxiety disorder Brother   . Bipolar disorder Brother      Review of Systems Constitutional: negative  Objective:  There were no vitals filed for this visit.  Physical Exam:   Mental Status Exam: Appearance:  Well groomed Psychomotor::  Within Normal Limits Attention span and concentration:  Normal Behavior: calm and cooperative Speech:  normal volume Mood:  depressed and anxious Affect:  normal Thought Process:  Coherent Thought Content:  Logical Orientation:  person, place and time/date Cognition:  grossly intact Insight:  Intact Judgment:  Intact Estimate of Intelligence: Average Fund of knowledge: Aware of current events Memory: Recent and remote intact Abnormal movements: None Gait and station: Normal  Assessment:  Diagnosis: MDD (major depressive disorder), recurrent episode, moderate (HCC) [F33.1] 1. MDD (major depressive disorder), recurrent episode, moderate (HCC)     Indications for admission: inpatient care required if not in partial hospital program  Plan: Order placed for OT (  occupational therapy)  patient enrolled in Partial Hospitalization Program, a comprehensive treatment plan will be developed and side effects of medications will be discussed and reviewed if any medication are initiated.  Treatment options and alternatives reviewed with patient and patient understands the above plan. Treatment plan was reviewed and agreed upon by NPT Amyre Segundo and patient's Sonya [name] need for group services.    Oneta Rack, NP

## 2017-12-19 NOTE — Therapy (Signed)
[facility] PARTIAL HOSPITALIZATION PROGRAM [address] [place], [place], [address] Phone: [phone]   Fax:  [phone]  Occupational Therapy Treatment  Patient Details  Name: Sonya [name] MRN: [id number] Date of Birth: [birth year] Referring Provider (OT): Hillery Jacks, NP   Encounter Date: 12/19/2017  OT End of Session - 12/19/17 1359    Visit Number  2    Number of Visits  16    Date for OT Re-Evaluation  01/15/18    Authorization Type  [employer] employee    OT Start Time  1100    OT Stop Time  1200    OT Time Calculation (min)  60 min    Activity Tolerance  Patient tolerated treatment well    Behavior During Therapy  [facility] for tasks assessed/performed       Past Medical History:  Diagnosis Date  . Anxiety   . Depression   . GERD (gastroesophageal reflux disease) 10/2017  . Meniere disease UNKNOWN  . Vertigo     Past Surgical History:  Procedure Laterality Date  . COLONOSCOPY WITH PROPOFOL N/A 10/25/2017   Procedure: COLONOSCOPY WITH PROPOFOL;  Surgeon: Toledo, Boykin Nearing, MD;  Location: [facility] ENDOSCOPY;  Service: Gastroenterology;  Laterality: N/A;  . ESOPHAGOGASTRODUODENOSCOPY (EGD) WITH PROPOFOL N/A 10/25/2017   Procedure: ESOPHAGOGASTRODUODENOSCOPY (EGD) WITH PROPOFOL;  Surgeon: Toledo, Boykin Nearing, MD;  Location: [facility] ENDOSCOPY;  Service: Gastroenterology;  Laterality: N/A;  . WISDOM TOOTH EXTRACTION    . WRIST SURGERY      There were no vitals filed for this visit.  Subjective Assessment - 12/19/17 1358    Currently in Pain?  No/denies        S: "I can't really remember how I sleep"   O: Pt educated on sleep hygiene as it pertains to daily life/routines this date. Sleep hygiene questionnaire administered to increase insight on current sleep habits to help develop future goals. Education given on appropriate sleep routines, sleep disorders, detriments of too much/too little sleep with encouraged feedback of personal Experiences.  Sleep diary handout given to challenge pt to track current habits and identify area for change. Further education given on relaxation techniques to implement before bed. Pt asked to identify one STG in relation to sleep hygiene to create better daily sleep habits.   A: Pt presents to group with depressed, flat affect, minimally engaged in session. Prior to session pt in hallway having heated conversation and tearful, stating she wants to go home, then not going home. Pt completed sleep hygiene questionnaire, identifying current "fair" sleep habits to improve this date including: improved bed time routine management.  Pt agreeable to use sleep diary this date. Pt identified goal of creating and maintaining a healthy bed time routine to improve sleep hygiene this date. Pt needing cueing to stay on with discussion during session. Pt was willing to share helpful strategies for herself, saying she uses a fan/white noise machine and has chosen to sleep in a different bed from her husband to improve her sleep hygiene this date.  P: Pt provided with skills to increase sleep hygiene habits into daily routine. OT will continue to follow up with communication skills for successful implementation into daily life.                     OT Education - 12/19/17 1358    Education Details  education given on sleep hygiene strategies to implement into BADL routine    Person(s) Educated  Patient  Methods  Explanation;Handout    Comprehension  Verbalized understanding       OT Short Term Goals - 12/19/17 1400      OT SHORT TERM GOAL #1   Title  Pt will be educated on strategies to improve psychosocial skills needed to participate fully in all daily, work, and leisure activities    Time  4    Period  Weeks    Status  On-going    Target Date  01/15/18      OT SHORT TERM GOAL #2   Title  Pt will apply psychosocial skills and coping mechansims to daily activities in order to function independently  and reintegrate into community dwelling    Time  4    Period  Weeks    Status  On-going      OT SHORT TERM GOAL #3   Title  Pt will recall and apply 1-3 sleep hygiene strategies to improve engagement in BADL routine    Time  4    Period  Weeks    Status  On-going      OT SHORT TERM GOAL #4   Title  Pt will recall and implement 1-3 time management strategies to improve function in IADL routine    Time  4    Period  Weeks    Status  On-going      OT SHORT TERM GOAL #5   Title  Pt will recall and apply 3-5 stress management strategies for application into community reintegration    Time  4    Period  Weeks    Status  On-going               Plan - 12/19/17 1359    Occupational performance deficits (Please refer to evaluation for details):  ADL's;IADL's;Rest and Sleep;Education;Work;Leisure;Social Participation       Patient will benefit from skilled therapeutic intervention in order to improve the following deficits and impairments:  Decreased coping skills, Decreased psychosocial skills, Other (comment)(decreased ability to engage in BADL and reintegrate into community)  Visit Diagnosis: Organic personality disorder  Difficulty coping    Problem List Patient Active Problem List   Diagnosis Date Noted  . Indication for care in labor and delivery, delivered 12/26/2014  . Indication for care or intervention related to labor and delivery 12/23/2014  . Labor and delivery, indication for care 10/13/2014   [name], MSOT, OTR/L Behavioral Health OT/ Acute Relief OT  [name] 12/19/2017, 2:02 PM  [facility] HOSPITALIZATION PROGRAM [address] [place], [place], [address] Phone: [phone]   Fax:  [phone]  Name: Akayla Brass MRN: [id number] Date of Birth: 25-Jul-1990

## 2017-12-20 ENCOUNTER — Ambulatory Visit: Payer: No Typology Code available for payment source | Admitting: Physical Therapy

## 2017-12-20 NOTE — Psych (Signed)
Individual Session 10:00 - 10:30  Cln and pt spent time discussing pt's overdose over the weekend. Pt reports it was an accident and "I didn't know what I was doing. I just wanted the anxiety to go away." Pt states she is struggling with anxiety and having to lean on husband (currently in divorce/separation process) for support. Cln spent time with pt discussing safety planning. Pt reports she has constant SI and denies intent/plan at this time. Pt identifies some triggers including mood swings and "things not going the way I want." Pt identifies mindfulness walking, reading, screaming aloud, and finding a moment of silence as coping skills to help when feeling anxious, sad, or impulsive. Pt identifies 3 supports to call although pt was hesitant to write down names and numbers. Pt concerned clns would contact identified supports; cln assured pt that was not possible unless written permission was granted. Pt understood and listed names and numbers. Cln provided pt with local and national suicide hotline numbers. Pt again denies intent/plan. Pt reports feeling safe.   Kirti Carl, LPCA, LCASA

## 2017-12-21 ENCOUNTER — Other Ambulatory Visit (HOSPITAL_COMMUNITY): Payer: No Typology Code available for payment source | Admitting: Occupational Therapy

## 2017-12-21 ENCOUNTER — Encounter (HOSPITAL_COMMUNITY): Payer: Self-pay | Admitting: Occupational Therapy

## 2017-12-21 ENCOUNTER — Other Ambulatory Visit (HOSPITAL_COMMUNITY): Payer: No Typology Code available for payment source | Admitting: Licensed Clinical Social Worker

## 2017-12-21 DIAGNOSIS — F332 Major depressive disorder, recurrent severe without psychotic features: Secondary | ICD-10-CM

## 2017-12-21 DIAGNOSIS — F070 Personality change due to known physiological condition: Secondary | ICD-10-CM

## 2017-12-21 DIAGNOSIS — R4589 Other symptoms and signs involving emotional state: Secondary | ICD-10-CM

## 2017-12-21 DIAGNOSIS — F331 Major depressive disorder, recurrent, moderate: Secondary | ICD-10-CM | POA: Diagnosis not present

## 2017-12-21 NOTE — Therapy (Signed)
[facility] PARTIAL HOSPITALIZATION PROGRAM [address] [place], [place], [address] Phone: [phone]   Fax:  [phone]  Occupational Therapy Treatment  Patient Details  Name: Sonya [name] MRN: [id number] Date of Birth: [birth year] Referring Provider (OT): Hillery Jacks, NP   Encounter Date: 12/21/2017  OT End of Session - 12/21/17 1342    Visit Number  3    Number of Visits  16    Date for OT Re-Evaluation  01/15/18    Authorization Type  [employer] employee    OT Start Time  1100    OT Stop Time  1200    OT Time Calculation (min)  60 min    Activity Tolerance  Patient tolerated treatment well    Behavior During Therapy  [facility] for tasks assessed/performed       Past Medical History:  Diagnosis Date  . Anxiety   . Depression   . GERD (gastroesophageal reflux disease) 10/2017  . Meniere disease UNKNOWN  . Vertigo     Past Surgical History:  Procedure Laterality Date  . COLONOSCOPY WITH PROPOFOL N/A 10/25/2017   Procedure: COLONOSCOPY WITH PROPOFOL;  Surgeon: Toledo, Boykin Nearing, MD;  Location: [facility] ENDOSCOPY;  Service: Gastroenterology;  Laterality: N/A;  . ESOPHAGOGASTRODUODENOSCOPY (EGD) WITH PROPOFOL N/A 10/25/2017   Procedure: ESOPHAGOGASTRODUODENOSCOPY (EGD) WITH PROPOFOL;  Surgeon: Toledo, Boykin Nearing, MD;  Location: [facility] ENDOSCOPY;  Service: Gastroenterology;  Laterality: N/A;  . WISDOM TOOTH EXTRACTION    . WRIST SURGERY      There were no vitals filed for this visit.  Subjective Assessment - 12/21/17 1341    Currently in Pain?  No/denies       S: "I don't know if I'll ever be able to function normally in the workplace"   O: Communication skills group completed with emphasis on control, influence and acceptance in regards to social scenarios. The control, influence, acceptance (CIA) framework was provided as a communication skill to help address uncomfortable or "elephant in the room" social situations. Pt was to write out 2-3  uncomfortable social situations to analyze within group. This exercise offered an opportunity for self-reflection in order to relieve stress and uncertainty when using appropriate communication skills/strategies when reintegrating into the community and daily routines.   A: Pt completed communication activity with peers. Pt actively engaged in communication skill worksheets this date. Pt identified 1 uncomfortable social situation with focus on reintegrating into the workplace. Pt identified she has control over her coping skills that she implements into her workplace. During session, pt with attention seeking behaviors about her skill set with work and saying she will never be able to work in another setting than the one she was fired from. Pt was able to accurately identify areas of control vs. areas not within personal control after presented with CIA framework. Pt expressed improved communication skill this date to carry over into community integration (work, other daily routines).   P: Pt provided with communication skills in the area of the CIA framework to implement into daily social situations when reintegrating into the community. OT will continue follow up with communication skills for successful implementation in daily life.                     OT Education - 12/21/17 1341    Education Details  education given on communication strategies to apply when reintegrating into community    Person(s) Educated  Patient    Methods  Explanation;Handout  Comprehension  Verbalized understanding       OT Short Term Goals - 12/19/17 1400      OT SHORT TERM GOAL #1   Title  Pt will be educated on strategies to improve psychosocial skills needed to participate fully in all daily, work, and leisure activities    Time  4    Period  Weeks    Status  On-going    Target Date  01/15/18      OT SHORT TERM GOAL #2   Title  Pt will apply psychosocial skills and coping mechansims to daily  activities in order to function independently and reintegrate into community dwelling    Time  4    Period  Weeks    Status  On-going      OT SHORT TERM GOAL #3   Title  Pt will recall and apply 1-3 sleep hygiene strategies to improve engagement in BADL routine    Time  4    Period  Weeks    Status  On-going      OT SHORT TERM GOAL #4   Title  Pt will recall and implement 1-3 time management strategies to improve function in IADL routine    Time  4    Period  Weeks    Status  On-going      OT SHORT TERM GOAL #5   Title  Pt will recall and apply 3-5 stress management strategies for application into community reintegration    Time  4    Period  Weeks    Status  On-going               Plan - 12/21/17 1342    Occupational performance deficits (Please refer to evaluation for details):  ADL's;IADL's;Rest and Sleep;Education;Work;Leisure;Social Participation       Patient will benefit from skilled therapeutic intervention in order to improve the following deficits and impairments:  Decreased coping skills, Decreased psychosocial skills, Other (comment)(decreased ability to engage in BADL and reintegrate into community)  Visit Diagnosis: Organic personality disorder  Difficulty coping    Problem List Patient Active Problem List   Diagnosis Date Noted  . Indication for care in labor and delivery, delivered 12/26/2014  . Indication for care or intervention related to labor and delivery 12/23/2014  . Labor and delivery, indication for care 10/13/2014   [name], MSOT, OTR/L Behavioral Health OT/ Acute Relief OT  [name] 12/21/2017, 1:45 PM  [facility] HOSPITALIZATION PROGRAM [address] [place], [place], [address] Phone: [phone]   Fax:  [phone]  Name: Sonya [name] MRN: [id number] Date of Birth: 12-08-1990

## 2017-12-22 ENCOUNTER — Other Ambulatory Visit (HOSPITAL_COMMUNITY): Payer: Self-pay

## 2017-12-22 ENCOUNTER — Ambulatory Visit (HOSPITAL_COMMUNITY): Payer: Self-pay

## 2017-12-22 NOTE — Psych (Signed)
   [facility] BH PHP THERAPIST PROGRESS NOTE  [name] [id number]  Session Time: 9-11:00  Participation Level: Active  Behavioral Response: CasualAlertDepressed  Type of Therapy: Group Therapy  Treatment Goals addressed: Coping  Interventions: CBT, DBT, Solution Focused, Supportive and Reframing  Summary:  Clinician led check-in regarding current stressors and situation, and review of patient completed daily inventory. Clinician utilized active listening and empathetic response and validated patient emotions. Clinician facilitated processing group on pertinent issues.  Therapist Response: [name] is a 27 y.o. female who presents with depression and anxiety symptoms. Patient arrived within time allowed and reports that she feels "anxious." Patient rates her mood at a 6 on a scale of 1-10 with 10 being great. Pt states she is anxious about starting group. Patient engaged in discussion.    Session Time: 11:00 -12:00   Participation Level: Active   Behavioral Response: CasualAlertDepressed   Type of Therapy: Group Therapy, OT   Treatment Goals addressed: Coping   Interventions: Psychosocial skills training, Supportive,    Summary:  Occupational Therapy group   Therapist Response: Patient engaged in group. See OT note.                 Session Time: 12:00 - 12:45   Participation Level: Active   Behavioral Response: CasualAlertDepressed   Type of Therapy: Group Therapy   Treatment Goals addressed: Coping   Interventions: Psychologist, occupational, Supportive   Summary:  Reflection Group: Patients encouraged to practice skills and interpersonal techniques or work on mindfulness and relaxation techniques. The importance of self-care and making skills part of a routine to increase usage were stressed    Therapist Response: Patient engaged and participated appropriately.              Session Time: 12:45- 2:00   Participation Level: Active   Behavioral  Response: CasualAlertDepressed   Type of Therapy: Group Therapy, Psychoeducation; Psychotherapy   Treatment Goals addressed: Coping   Interventions: CBT; Solution focused; Supportive; Reframing   Summary: 12:45 - 1:50: Clinician introduced "Mindfulness". Group discussed "What" and "How" skills of mindfulness. Patients identified what types of activities would help them practice mindfulness. Patients took part in a body scan activity and other activities and discussed which would work best for them. Patients created mindfulness glitter bottles.  1:50 -2:00 Clinician led check-out. Clinician assessed for immediate needs, medication compliance and efficacy, and safety concerns     Therapist Response: Patient engaged in activity. Pt reports she will incorporate mindfulness into her daily routine by being mindful during showers and will use word searches to have set practice time.  At check-out, patient rates her mood at a 6 on a scale of 1-10 with 10 being great. Pt reports no specific afternoon plans. Patient demonstrates some progress as evidenced by engaging in first session. Patient denies SI/HI/self-harm thoughts at the end of group.  Suicidal/Homicidal: Yeswithout intent/plan  Plan: Pt will continue in PHP while working to decrease depression and anxiety symptoms, increase ability to use skills to manage the symptoms, and eliminate SI.  Diagnosis: Severe episode of recurrent major depressive disorder, without psychotic features (HCC) [F33.2]    1. Severe episode of recurrent major depressive disorder, without psychotic features (HCC)     [name] [facility] [place], LPCA, LCASA 12/22/2017

## 2017-12-25 ENCOUNTER — Other Ambulatory Visit (HOSPITAL_COMMUNITY): Payer: Self-pay

## 2017-12-25 ENCOUNTER — Ambulatory Visit (HOSPITAL_COMMUNITY): Payer: Self-pay | Admitting: Occupational Therapy

## 2017-12-26 ENCOUNTER — Other Ambulatory Visit (HOSPITAL_COMMUNITY): Payer: No Typology Code available for payment source | Admitting: Occupational Therapy

## 2017-12-26 ENCOUNTER — Other Ambulatory Visit (HOSPITAL_COMMUNITY): Payer: No Typology Code available for payment source | Admitting: Licensed Clinical Social Worker

## 2017-12-26 ENCOUNTER — Encounter (HOSPITAL_COMMUNITY): Payer: Self-pay | Admitting: Occupational Therapy

## 2017-12-26 DIAGNOSIS — R4589 Other symptoms and signs involving emotional state: Secondary | ICD-10-CM

## 2017-12-26 DIAGNOSIS — F332 Major depressive disorder, recurrent severe without psychotic features: Secondary | ICD-10-CM

## 2017-12-26 DIAGNOSIS — F070 Personality change due to known physiological condition: Secondary | ICD-10-CM

## 2017-12-26 DIAGNOSIS — F331 Major depressive disorder, recurrent, moderate: Secondary | ICD-10-CM | POA: Diagnosis not present

## 2017-12-26 DIAGNOSIS — F411 Generalized anxiety disorder: Secondary | ICD-10-CM

## 2017-12-26 NOTE — Therapy (Signed)
[facility] PARTIAL HOSPITALIZATION PROGRAM [address] [place], [place], [address] Phone: [phone]   Fax:  [phone]  Occupational Therapy Treatment  Patient Details  Name: Sonya [name] MRN: [id number] Date of Birth: 10/11/1990 Referring Provider (OT): Hillery Jacks, NP   Encounter Date: 12/26/2017  OT End of Session - 12/26/17 1419    Visit Number  4    Number of Visits  16    Date for OT Re-Evaluation  01/15/18    Authorization Type  [employer] employee    OT Start Time  1100    OT Stop Time  1200    OT Time Calculation (min)  60 min    Activity Tolerance  Patient tolerated treatment well    Behavior During Therapy  [facility] for tasks assessed/performed       Past Medical History:  Diagnosis Date  . Anxiety   . Depression   . GERD (gastroesophageal reflux disease) 10/2017  . Meniere disease UNKNOWN  . Vertigo     Past Surgical History:  Procedure Laterality Date  . COLONOSCOPY WITH PROPOFOL N/A 10/25/2017   Procedure: COLONOSCOPY WITH PROPOFOL;  Surgeon: Toledo, Boykin Nearing, MD;  Location: [facility] ENDOSCOPY;  Service: Gastroenterology;  Laterality: N/A;  . ESOPHAGOGASTRODUODENOSCOPY (EGD) WITH PROPOFOL N/A 10/25/2017   Procedure: ESOPHAGOGASTRODUODENOSCOPY (EGD) WITH PROPOFOL;  Surgeon: Toledo, Boykin Nearing, MD;  Location: [facility] ENDOSCOPY;  Service: Gastroenterology;  Laterality: N/A;  . WISDOM TOOTH EXTRACTION    . WRIST SURGERY      There were no vitals filed for this visit.  Subjective Assessment - 12/26/17 1418    Currently in Pain?  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 Pt to complete career interest survey and report findings as it compares with original personal beliefs. If original career established, pt to share findings in comparison with current job. TED talk on "multipotentialites" shown  to help pts understand the possibility and acceptance of multiple career changes in adult life. Pt encouraged to share thoughts and feelings related to video, and if they have considered changing careers.  A: Pt presents to group with blunted affect, engaged and participatory throughout session. Pt completed career interest survey, with findings pointing to careers related to science. Pt is currently a pharmacy tech, with interests to complete cosmetology. Encouraged pt to think of findings in broader sense, as fulfilling skill to complete on the side. Pt shares that the multipotentialites video helped open her sense and inspire her to know it's okay to change careers later in life.  P: OT will continue to follow up with job readiness skill to ensure application into community integration.                    OT Education - 12/26/17 1418    Education Details  education given on job readiness asit applies to community reintegration    Person(s) Educated  Patient    Methods  Explanation;Handout    Comprehension  Verbalized understanding       OT Short Term Goals - 12/19/17 1400      OT SHORT TERM GOAL #1   Title  Pt will be educated on strategies to improve psychosocial skills needed to participate fully in all daily, work, and leisure activities    Time  4    Period  Weeks  Status  On-going    Target Date  01/15/18      OT SHORT TERM GOAL #2   Title  Pt will apply psychosocial skills and coping mechansims to daily activities in order to function independently and reintegrate into community dwelling    Time  4    Period  Weeks    Status  On-going      OT SHORT TERM GOAL #3   Title  Pt will recall and apply 1-3 sleep hygiene strategies to improve engagement in BADL routine    Time  4    Period  Weeks    Status  On-going      OT SHORT TERM GOAL #4   Title  Pt will recall and implement 1-3 time management strategies to improve function in IADL routine    Time  4     Period  Weeks    Status  On-going      OT SHORT TERM GOAL #5   Title  Pt will recall and apply 3-5 stress management strategies for application into community reintegration    Time  4    Period  Weeks    Status  On-going               Plan - 12/26/17 1419    Occupational performance deficits (Please refer to evaluation for details):  ADL's;IADL's;Rest and Sleep;Education;Work;Leisure;Social Participation       Patient will benefit from skilled therapeutic intervention in order to improve the following deficits and impairments:  Decreased coping skills, Decreased psychosocial skills, Other (comment)(decreased ability to engage in BADL and reintegrate into community)  Visit Diagnosis: Organic personality disorder  Difficulty coping    Problem List Patient Active Problem List   Diagnosis Date Noted  . Indication for care in labor and delivery, delivered 12/26/2014  . Indication for care or intervention related to labor and delivery 12/23/2014  . Labor and delivery, indication for care 10/13/2014   [name], MSOT, OTR/L Behavioral Health OT/ Acute Relief OT  [name] 12/26/2017, 2:25 PM  [facility] HOSPITALIZATION PROGRAM [address] [place], [place], [address] Phone: [phone]   Fax:  [phone]  Name: Sonya [name] MRN: [id number] Date of Birth: 1990-07-06

## 2017-12-26 NOTE — Psych (Signed)
   [facility] BH PHP THERAPIST PROGRESS NOTE  Abagael Kramm [id number]  Session Time: 9:00 -11:00  Participation Level: Active  Behavioral Response: CasualAlertDepressed  Type of Therapy: Group Therapy  Treatment Goals addressed: Coping  Interventions: CBT, DBT, Solution Focused, Supportive and Reframing  Summary:  Clinician led check-in regarding current stressors and situation, and review of patient completed daily inventory. Clinician utilized active listening and empathetic response and validated patient emotions. Clinician facilitated processing group on pertinent issues.  Therapist Response: Sonya [name] is a 27 y.o. female who presents with depression and anxiety symptoms. Patient arrived within time allowed and reports that she feels "numb." Patient rates her mood at a 2 on a scale of 1-10 with 10 being great. Pt reports "I can't remember yesterday" and states she knows she ran errands with her husband and watched a movie with her daughter. Pt states she feels "extremely" depressed and feels stressed about her current life situation. Patient engaged in discussion.    Session Time: 11:00 -12:00   Participation Level: Active   Behavioral Response: CasualAlertDepressed   Type of Therapy: Group Therapy, OT   Treatment Goals addressed: Coping   Interventions: Psychosocial skills training, Supportive,    Summary:  Occupational Therapy group   Therapist Response: Patient engaged in group. See OT note.                 Session Time: 12:00 - 12:45   Participation Level: Active   Behavioral Response: CasualAlertDepressed   Type of Therapy: Group Therapy   Treatment Goals addressed: Coping   Interventions: Psychologist, occupational, Supportive   Summary:  Reflection Group: Patients encouraged to practice skills and interpersonal techniques or work on mindfulness and relaxation techniques. The importance of self-care and making skills part of a routine to increase usage  were stressed    Therapist Response: Patient engaged and participated appropriately.              Session Time: 12:45- 2:00  Participation Level: Active  Behavioral Response: CasualAlertDepressed  Type of Therapy: Group Therapy, Psychoeducation; Psychotherapy  Treatment Goals addressed: Coping  Interventions: CBT; Solution focused; Supportive; Reframing  Summary: 1:00 - 1:50: Clinician introduced topic of self esteem. Cln provided psycho-education on the roots of healthy/unhealthy self esteem. Group reviewed CBT based model for altering core beliefs and addressing the maladaptive rules stemming from low self esteem. Group completed examples from both models.  1:50 -2:00 Clinician led check-out. Clinician assessed for immediate needs, medication compliance and efficacy, and safety concerns   Therapist Response: Patient engaged activity and discussion. Pt identifies their self esteem as low currently. Pt completed examples for altering core beliefs and maladaptive behaviors.  At check-out, patient rates her mood at a 2 on a scale of 1-10 with 10 being great. Patient reports afternoon plans of runnign errands and going to a bible study. Patient demonstrates some progress as evidenced by not experiencing SI today. Patient denies SI/HI/self-harm at the end of group.     Suicidal/Homicidal: No without intent/plan   Plan: Pt will continue in PHP while working to decrease depression and anxiety symptoms, increase ability to use skills to manage the symptoms, and eliminate SI.  Diagnosis: MDD (major depressive disorder), recurrent episode, moderate (HCC) [F33.1]    1. MDD (major depressive disorder), recurrent episode, moderate (HCC)     [name], LCSW, LCAS 12/26/2017

## 2017-12-26 NOTE — Psych (Signed)
   [facility] BH PHP THERAPIST PROGRESS NOTE  Sonya [name] [id number]  Session Time: 9:00 -11:00  Participation Level: Active  Behavioral Response: CasualAlertDepressed  Type of Therapy: Group Therapy  Treatment Goals addressed: Coping  Interventions: CBT, DBT, Solution Focused, Supportive and Reframing  Summary:  Clinician led check-in regarding current stressors and situation, and review of patient completed daily inventory. Clinician utilized active listening and empathetic response and validated patient emotions. Clinician facilitated processing group on pertinent issues.  Therapist Response: [name] is a 27 y.o. female who presents with depression and anxiety symptoms. Patient arrived within time allowed and reports that she feels "okay." Patient rates her mood at a 5 on a scale of 1-10 with 10 being great. Pt reports her yesterday was "fine. Nothing really happened."  Pt states she continues to feel like a failure and experience worthlessness. Pt reports she continues to sleep 10-11 hours. Patient engaged in discussion.    Session Time: 11:00 -12:00   Participation Level: Active   Behavioral Response: CasualAlertDepressed   Type of Therapy: Group Therapy, OT   Treatment Goals addressed: Coping   Interventions: Psychosocial skills training, Supportive,    Summary:  Occupational Therapy group   Therapist Response: Patient engaged in group. See OT note.                 Session Time: 12:00 - 12:45   Participation Level: Active   Behavioral Response: CasualAlertDepressed   Type of Therapy: Group Therapy   Treatment Goals addressed: Coping   Interventions: Psychologist, occupational, Supportive   Summary:  Reflection Group: Patients encouraged to practice skills and interpersonal techniques or work on mindfulness and relaxation techniques. The importance of self-care and making skills part of a routine to increase usage were stressed    Therapist Response:  Patient engaged and participated appropriately.              Session Time: 12:45- 2:00  Participation Level: Active  Behavioral Response: CasualAlertDepressed  Type of Therapy: Group Therapy, Psychoeducation; Psychotherapy  Treatment Goals addressed: Coping  Interventions: CBT; Solution focused; Supportive; Reframing  Summary: 12:45 - 1:50: Group participated in relationship workshop. Pt's took  inventory of current relationships and discussed what it means to maintain a positive relationship. Boundaries, communication, and healthy/unhealthy traits were discussed as pertained to pt's relationship struggles.  1:50 -2:00 Clinician led check-out. Clinician assessed for immediate needs, medication compliance and efficacy, and safety concerns   Therapist Response: Patient engaged in activity and discussion. Patient identified struggling in the relationship with her husband and feeling guilt and self-hatred. Pt able to accept feedback about reframing feelings.  At check-out, patient rates her mood at a 8 on a scale of 1-10 with 10 being great. Pt reports afternoon plans to clean the house.  Pt demonstrates some progress as evidenced by increased openness in sessions. Pt denies SI/HI/self-harm at the end of group.      Suicidal/Homicidal: No without intent/plan   Plan: Pt will continue in PHP while working to decrease depression and anxiety symptoms, increase ability to use skills to manage the symptoms, and eliminate SI.  Diagnosis: Severe episode of recurrent major depressive disorder, without psychotic features (HCC) [F33.2]    1. Severe episode of recurrent major depressive disorder, without psychotic features (HCC)     [name], LCSW, LCAS 12/26/2017

## 2017-12-27 ENCOUNTER — Encounter (HOSPITAL_COMMUNITY): Payer: Self-pay

## 2017-12-27 ENCOUNTER — Other Ambulatory Visit (HOSPITAL_COMMUNITY): Payer: No Typology Code available for payment source | Admitting: Licensed Clinical Social Worker

## 2017-12-27 VITALS — BP 122/76 | HR 93 | Ht 62.0 in | Wt 210.0 lb

## 2017-12-27 DIAGNOSIS — F332 Major depressive disorder, recurrent severe without psychotic features: Secondary | ICD-10-CM

## 2017-12-27 DIAGNOSIS — F331 Major depressive disorder, recurrent, moderate: Secondary | ICD-10-CM | POA: Diagnosis not present

## 2017-12-27 MED ORDER — SERTRALINE HCL 50 MG PO TABS: 50.0000 mg | ORAL_TABLET | Freq: Every day | ORAL | 0 refills | Status: DC

## 2017-12-27 MED ORDER — CARBAMAZEPINE ER 100 MG PO TB12: 100.0000 mg | ORAL_TABLET | Freq: Two times a day (BID) | ORAL | 0 refills | Status: DC

## 2017-12-27 NOTE — Progress Notes (Signed)
BH MD/PA/NP OP Progress Note  12/27/2017 11:55 AM Sonya [name]  MRN:  [id number]   Sonya [name] was evaluated by NP and registered nurse.  Patient is awake alert and oriented x3.  Presents pleasant, calm and cooperative.  Patient reports she received some good news for her employer Sonya [name]) reports  that she was rehired as she reported she was fired last week Friday. States she received a call from her supervisor who told her to return back to work.  As they had overlook her ADA forms.  Summary reports she  went back to work  for 1 day and felt overwhelmed was unable to concentrate.  Patient expressed concerns that her symptoms are in line with bipolar diagnoses.  Education was provided with the proper diagnosis and symptom management.  Patient appeared to understand information that was provided.  Discussed Genesight testing with patient.  Patient was agreeable to start Zoloft 50 mg and will initiate Tegretol 100 mg p.o. twice daily.  Will complete leave of absence forms while attending partial hospitalization program when you are(PHP). See chart. Support, encouragement and  reassurance was provided.   History: Per [facility] assessment note: Pt presents referred by her psychiatrist. Pt states the referral was made a few months ago but pt couldn't because of work. Pt states yesterday she was fired due to absences related to her health. Pt reports she is also trying to separate from her husband which is stressful. Pt states history of depression and anxiety and reports increasingly reckless behaviors over the past week including sex with strangers from the internet and maxing out her credit cards. Pt reports recent suicial ideation including considering overdosing and shooting herself. Pt states her husband has removed all the guns from the home and flushed the medication she could have used to overdose.    Visit Diagnosis:    ICD-10-CM   1. MDD (major depressive disorder), recurrent episode, moderate (HCC)  F33.1     Past Psychiatric History:   Past Medical History:  Past Medical History:  Diagnosis Date  . Anxiety   . Depression   . GERD (gastroesophageal reflux disease) 10/2017  . Meniere disease UNKNOWN  . Vertigo     Past Surgical History:  Procedure Laterality Date  . COLONOSCOPY WITH PROPOFOL N/A 10/25/2017   Procedure: COLONOSCOPY WITH PROPOFOL;  Surgeon: Toledo, Boykin Nearing, MD;  Location: [facility];  Service: Gastroenterology;  Laterality: N/A;  . ESOPHAGOGASTRODUODENOSCOPY (EGD) WITH PROPOFOL N/A 10/25/2017   Procedure: ESOPHAGOGASTRODUODENOSCOPY (EGD) WITH PROPOFOL;  Surgeon: Toledo, Boykin Nearing, MD;  Location: [facility];  Service: Gastroenterology;  Laterality: N/A;  . WISDOM TOOTH EXTRACTION    . WRIST SURGERY      Family Psychiatric History:   Family History:  Family History  Problem Relation Age of Onset  . Graves' disease Mother   . Physical abuse Mother   . Drug abuse Father   . Alcohol abuse Father   . Seizures Father   . Drug abuse Sister   . Anxiety disorder Sister   . Depression Brother   . Anxiety disorder Brother   . Bipolar disorder Brother     Social History:  Social History   Socioeconomic History  . Marital status: Married    Spouse name: Sonya [name]  . Number of children: 1  . Years of education: Not on file  . Highest education level: Associate degree: occupational, Scientist, product/process development, or vocational program  Occupational History    Comment: not employed  Social Needs  . Physicist, medical  strain: Hard  . Food insecurity:    Worry: Often true    Inability: Often true  . Transportation needs:    Medical: No    Non-medical: No  Tobacco Use  . Smoking status: Light Tobacco Smoker    Types: Cigarettes  . Smokeless tobacco: Never Used  . Tobacco comment: Reports recenlty smoked a few when she returned to the hospital   Substance and Sexual Activity  . Alcohol use: Yes    Comment: Occasional drinks with dinner twice a month   . Drug use: No   . Sexual activity: Yes    Partners: Male    Birth control/protection: Pill  Lifestyle  . Physical activity:    Days per week: 0 days    Minutes per session: 0 min  . Stress: Not on file  Relationships  . Social connections:    Talks on phone: More than three times a week    Gets together: More than three times a week    Attends religious service: Never    Active member of club or organization: No    Attends meetings of clubs or organizations: Never    Relationship status: Married  Other Topics Concern  . Not on file  Social History Narrative  . Not on file    Allergies:  Allergies  Allergen Reactions  . Adipex-P [Phentermine] Shortness Of Breath, Swelling, Palpitations and Hypertension    Tunnel vision, chest pain, headache, and feet became swollen  . Codeine Nausea And Vomiting  . Adhesive [Tape] Rash    Patient prefers paper tape    Metabolic Disorder Labs: No results found for: HGBA1C, MPG No results found for: PROLACTIN No results found for: CHOL, TRIG, HDL, CHOLHDL, VLDL, LDLCALC Lab Results  Component Value Date   TSH 2.61 08/15/2013    Therapeutic Level Labs: No results found for: LITHIUM No results found for: VALPROATE No components found for:  CBMZ  Current Medications: Current Outpatient Medications  Medication Sig Dispense Refill  . Aspirin-Acetaminophen-Caffeine (GOODY HEADACHE PO) Take 1 packet by mouth once as needed (for headaches).    . carbamazepine (TEGRETOL-XR) 100 MG 12 hr tablet Take 1 tablet (100 mg total) by mouth 2 (two) times daily. 60 tablet 0  . clonazePAM (KLONOPIN) 0.5 MG tablet Take 1 tablet (0.5 mg total) by mouth as directed. Take it only up to 3 times a week for severe anxiety symptoms only (Patient not taking: Reported on 12/27/2017) 15 tablet 0  . Dexlansoprazole (DEXILANT) 30 MG capsule Take 30 mg by mouth daily.    . Ferrous Sulfate (IRON) 28 MG TABS Take 28 mg by mouth daily.    . fluticasone (FLONASE) 50 MCG/ACT nasal  spray Place 1 spray into both nostrils daily.   12  . Homeopathic Products (AVENOC EX) Apply topically.    Marland Kitchen ibuprofen (ADVIL,MOTRIN) 800 MG tablet Take 800 mg by mouth every 6 (six) hours as needed (for pain or migraines).    . Lactobacillus Rhamnosus, GG, ([employer] PROBIOTIC, LACTOBACILLUS,) CAPS Take 1 capsule by mouth daily.    . meclizine (ANTIVERT) 25 MG tablet Take 25 mg by mouth 3 (three) times daily as needed (for vertigo).   0  . Multiple Vitamins-Minerals (ONE-A-DAY WOMENS VITACRAVES) CHEW Chew 2 tablets by mouth daily.    . nitrofurantoin, macrocrystal-monohydrate, (MACROBID) 100 MG capsule Take 1 capsule (100 mg total) by mouth 2 (two) times daily. 1 po BId (Patient not taking: Reported on 12/27/2017) 10 capsule 0  . ondansetron (ZOFRAN  ODT) 4 MG disintegrating tablet Take 1 tablet (4 mg total) by mouth every 6 (six) hours as needed for nausea or vomiting. (Patient not taking: Reported on 12/27/2017) 20 tablet 0  . pantoprazole (PROTONIX) 40 MG tablet Take 40 mg by mouth daily before breakfast.     . promethazine (PHENERGAN) 25 MG tablet Take 1 tablet (25 mg total) by mouth every 6 (six) hours as needed for nausea or vomiting. (Patient not taking: Reported on 12/18/2017) 20 tablet 0  . propranolol (INDERAL) 10 MG tablet Take 1 tablet (10 mg total) by mouth 2 (two) times daily as needed. For severe anxiety sx (Patient not taking: Reported on 12/18/2017) 180 tablet 0  . sertraline (ZOLOFT) 50 MG tablet Take 1 tablet (50 mg total) by mouth daily. 60 tablet 0  . triamterene-hydrochlorothiazide (DYAZIDE) 37.5-25 MG capsule Take 1 capsule by mouth daily.  5  . VENTOLIN HFA 108 (90 Base) MCG/ACT inhaler Inhale 2 puffs into the lungs every 6 (six) hours as needed for wheezing or shortness of breath.      No current facility-administered medications for this visit.      Musculoskeletal: Strength & Muscle Tone: within normal limits Gait & Station: normal Patient leans: N/A  Psychiatric  Specialty Exam: ROS  Blood pressure 122/76, pulse 93, height 5\' 2"  (1.575 m), weight 210 lb (95.3 kg), last menstrual period 11/29/2017, SpO2 97 %, unknown if currently breastfeeding.Body mass index is 38.41 kg/m.  General Appearance: Casual  Eye Contact:  Good  Speech:  Clear and Coherent  Volume:  Normal  Mood:  Anxious and Depressed  Affect:  Congruent  Thought Process:  Coherent  Orientation:  Full (Time, Place, and Person)  Thought Content: Logical   Suicidal Thoughts:  No  Homicidal Thoughts:  No  Memory:  Immediate;   Fair Recent;   Fair Remote;   Fair  Judgement:  Fair  Insight:  Fair  Psychomotor Activity:  Normal  Concentration:  Concentration: Fair  Recall:  [employer] of Knowledge: Fair  Language: Fair  Akathisia:  No  Handed:  Right  AIMS (if indicated):   Assets:  Communication Skills Desire for Improvement Resilience Social Support  ADL's:  Intact  Cognition: WNL  Sleep:  NA   Screenings: GAD-7     Counselor from 12/18/2017 in [facility]PITALIZATION PROGRAM  Total GAD-7 Score  20    PHQ2-9     Counselor from 12/18/2017 in BEHAVIORAL HEALTH PARTIAL HOSPITALIZATION PROGRAM  PHQ-2 Total Score  4  PHQ-9 Total Score  21       Assessment and Plan:  Continue in Partial Hospitalization Program  Medication management:    Patient was restarted on Zoloft 50 mg and Tegretol 100 mg Po BID    Employment Leave form was completed on 12/29/2017. Consider referral for Dialectical Behavior Therapy (DBT) at discharge  Treatment plan was reviewed and agreed upon by NPT Sonya [name] and patient's Sonya [name] need for group services.   Oneta Rack, NP 12/27/2017, 11:55 AM

## 2017-12-27 NOTE — Progress Notes (Signed)
Patient presents with flat affect, depressed mood and reported still having some passive suicidal ideations most days, but denied any plan or intent to want to harm self and no homicidal ideations.  Patient stated she would inform PHP staff if thoughts ever worsened or she began to plan or think more about wanting to harm herself.  Patient reported her and her husband "have made up" but is ambivalent if this was the right choice for her stating she is just not sure if he "is really going to change". Patient stated this agreement has stopped her wreackless behaviors regarding meeting other men and feels good about this decision.  Patient rated her depression a 9, anxiety a 2, and hopelessness a 12 on a scale of 0-10 with 0 being none 10 the worst she could manage.  Patient stated the 12 was only because she is just not sure if she is doing the right thing with her husband at this point but again denies any suicidal plans or intent at this time.  Patient discussed medications with Hillery Jacks, NP as she reported concerns she feels she may be Bipolar.  Discussed with patient how medications work to help with presenting symptoms and NP not ready to change diagnosis at this point but will help treat what she is reporting.  Patient stated she has had multiple problems with medications in the past but after much discussion agreed to try Zoloft and Tegretol to help with mood and depression.  Encouraged patient to take medications daily as prescribed and to let us know if any problems or side effects with new medications.  Patient reported she did not loose her job, tried to return to work this past Monday but felt overwhelmed and left later that day.  Patient will bring in forms to complete to keep her out while in [facility].  Patient agreed with plan and NP will send in medications for patient to start.  Patient will contact this nurse or PHP staff if any problems with new medications.

## 2017-12-27 NOTE — Progress Notes (Signed)
Spiritual care group 12/27/2017 11:00-12:20 ?Facilitated by by Chaplain Shamanda Len.  Spiritual care group focused on topic of "community," exploring group member's current experience of community, what they value and what they hope for in community. Group engaged in facilitated discussion and then chose pieces of art or pictures that represented their current experience of community and what they long for. Group facilitation drew on Narrative and Adlerian frameworks as well as brief CBT.  

## 2017-12-27 NOTE — Psych (Signed)
[facility] BH PHP THERAPIST PROGRESS NOTE  Sonya [name] [id number]  Session Time: 9:00 -11:00  Participation Level: Active  Behavioral Response: CasualAlertDepressed  Type of Therapy: Group Therapy  Treatment Goals addressed: Coping  Interventions: CBT, DBT, Solution Focused, Supportive and Reframing  Summary:  Clinician led check-in regarding current stressors and situation, and review of patient completed daily inventory. Clinician utilized active listening and empathetic response and validated patient emotions. Clinician facilitated processing group on pertinent issues.  Therapist Response: Sonya [name] is a 27 y.o. female who presents with depression and anxiety symptoms. Patient arrived within time allowed and reports that she feels "not great." Patient rates her mood at a 1.5 on a scale of 1-10 with 10 being great. Pt states she has been dealing with a migraine since yesterday and her anxiety and depression symptoms have spiked. Pt shares that on Friday she heard from her old job and they "reversed my termination." Pt states she was happy about this and agreed to a shift on Monday. Pt reports she was unable to make it through the shift and had a "mental breakdown." Pt reports this made her realize she is not able to work right now and she feels worthless. Alternately, pt shares it was her daughter's birthday this weekend and they were able to celebrate. Pt reports struggling with self hatred. Patient engaged in discussion.    Session Time: 11:00 -12:00   Participation Level: Active   Behavioral Response: CasualAlertDepressed   Type of Therapy: Group Therapy, OT   Treatment Goals addressed: Coping   Interventions: Psychosocial skills training, Supportive,    Summary:  Occupational Therapy group   Therapist Response: Patient engaged in group. See OT note.                 Session Time: 12:00 - 12:45   Participation Level: Active   Behavioral Response:  CasualAlertDepressed   Type of Therapy: Group Therapy   Treatment Goals addressed: Coping   Interventions: Psychologist, occupational, Supportive   Summary:  Reflection Group: Patients encouraged to practice skills and interpersonal techniques or work on mindfulness and relaxation techniques. The importance of self-care and making skills part of a routine to increase usage were stressed    Therapist Response: Patient engaged and participated appropriately.              Session Time: 12:45 - 2:00   Participation Level: Active   Behavioral Response: CasualAlertDepressed   Type of Therapy: Group Therapy, Psychotherapy; Psychoeducation   Treatment Goals addressed: Coping   Interventions: CBT, Solution focused, Supportive, Reframing   Summary: 12:45 - 1:50 Cln introduced topic of boundaries. Cln provided psychoeducation on what boundaries are; porous, rigid and healthy boundary characteristics; and the different types of boundaries. Group also discussed how to begin setting boundaries and how assertiveness skills can be used to set and maintain boundaries.  1:50 -2:00 Clinician led check-out. Clinician assessed for immediate needs, medication compliance and efficacy, and safety concerns      Therapist Response: Patient engaged in group. Pt reports understanding of boundaries and shares she has mainly porous boundaries.  At check-out, patient rates her mood at a 7 on a scale of 1-10 with 10 being great. Pt reports afternoon plans of spending time with her mom. Patient demonstrates some progress as evidenced by increased periods of stabilized mood. Patient denies SI/HI/self-harm thoughts at the end of group.        Suicidal/Homicidal: No without intent/plan   Plan: Pt will continue in  PHP while working to decrease depression and anxiety symptoms, increase ability to use skills to manage the symptoms, and eliminate SI.  Diagnosis: Severe episode of recurrent major depressive  disorder, without psychotic features (HCC) [F33.2]    1. Severe episode of recurrent major depressive disorder, without psychotic features (HCC)   2. GAD (generalized anxiety disorder)     [name], LCSW, LCAS 12/27/2017

## 2017-12-28 ENCOUNTER — Other Ambulatory Visit (HOSPITAL_COMMUNITY): Payer: No Typology Code available for payment source | Admitting: Occupational Therapy

## 2017-12-28 ENCOUNTER — Encounter (HOSPITAL_COMMUNITY): Payer: Self-pay

## 2017-12-28 ENCOUNTER — Other Ambulatory Visit (HOSPITAL_COMMUNITY): Payer: No Typology Code available for payment source | Admitting: Licensed Clinical Social Worker

## 2017-12-28 DIAGNOSIS — R4589 Other symptoms and signs involving emotional state: Secondary | ICD-10-CM

## 2017-12-28 DIAGNOSIS — F070 Personality change due to known physiological condition: Secondary | ICD-10-CM

## 2017-12-28 DIAGNOSIS — F411 Generalized anxiety disorder: Secondary | ICD-10-CM

## 2017-12-28 DIAGNOSIS — F331 Major depressive disorder, recurrent, moderate: Secondary | ICD-10-CM | POA: Diagnosis not present

## 2017-12-28 DIAGNOSIS — F332 Major depressive disorder, recurrent severe without psychotic features: Secondary | ICD-10-CM

## 2017-12-28 NOTE — Therapy (Signed)
[facility] PARTIAL HOSPITALIZATION PROGRAM [address] [place], [place], [address] Phone: [phone]   Fax:  [phone]  Occupational Therapy Treatment  Patient Details  Name: Sonya [name] MRN: [id number] Date of Birth: October 21, 1990 Referring Provider (OT): Hillery Jacks, NP   Encounter Date: 12/28/2017  OT End of Session - 12/28/17 1226    Visit Number  5    Number of Visits  16    Date for OT Re-Evaluation  01/15/18    Authorization Type  [employer] employee    OT Start Time  1100    OT Stop Time  1200    OT Time Calculation (min)  60 min    Activity Tolerance  Patient tolerated treatment well    Behavior During Therapy  [facility] for tasks assessed/performed       Past Medical History:  Diagnosis Date  . Anxiety   . Depression   . GERD (gastroesophageal reflux disease) 10/2017  . Meniere disease UNKNOWN  . Vertigo     Past Surgical History:  Procedure Laterality Date  . COLONOSCOPY WITH PROPOFOL N/A 10/25/2017   Procedure: COLONOSCOPY WITH PROPOFOL;  Surgeon: Toledo, Boykin Nearing, MD;  Location: [facility] ENDOSCOPY;  Service: Gastroenterology;  Laterality: N/A;  . ESOPHAGOGASTRODUODENOSCOPY (EGD) WITH PROPOFOL N/A 10/25/2017   Procedure: ESOPHAGOGASTRODUODENOSCOPY (EGD) WITH PROPOFOL;  Surgeon: Toledo, Boykin Nearing, MD;  Location: [facility] ENDOSCOPY;  Service: Gastroenterology;  Laterality: N/A;  . WISDOM TOOTH EXTRACTION    . WRIST SURGERY      There were no vitals filed for this visit.  Subjective Assessment - 12/28/17 1226    Currently in Pain?  No/denies        S: "I am always late for everything"  O: Education received on time management to help increase occupational balance and quality of life.?Time management activity given, with teams of 2 listing names of states on blank map in set amount of time. Reflection with group given on strategy, purpose, and feelings on activity. Education given on strategy to prioritize a "to do list" effectively,  with practice during group. Pt given to do list and asked to share top priorities and lower priorities. Time management handout given and discussed with daily life time management tips that can be incorporated into daily routine. Activities wheel activity administered with pt to identify hours devoted to: work/obligations, leisure/relaxation, self-care/caregiving, and sleep/rest. Further education given to allow pt to better balance activity wheel for increased occupational balance. Weekly planner handouts given at end of session with education on different modalities of organization (planner,?apps, computer, to do lists, etc.) so pt could choose preferred avenue. Further education given on the importance of time management in community reintegration from [facility] and how to continue using skills.   A:?Pt presents to group with blunted affect, engaged and participatory this date. Pt completed time management activity, stating she had little strategy but wanted to finish the activity. 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 Pt completed activities wheel, stating she feels she does not have enough time to finish daily tasks. Pt encouraged to use planner and create set routine to facilitate better performance when returning to work.  P: Pt provided with education on time management skills to increase occupational balance and quality of life. OT will continue to follow up with pt for successful implementation into daily life.  OT Education - 12/28/17 1226    Education Details  education given on time management skills    Person(s) Educated  Patient    Methods  Explanation;Handout    Comprehension  Verbalized understanding       OT Short Term Goals - 12/19/17 1400      OT SHORT TERM GOAL #1   Title  Pt will be educated on strategies to improve psychosocial skills needed to  participate fully in all daily, work, and leisure activities    Time  4    Period  Weeks    Status  On-going    Target Date  01/15/18      OT SHORT TERM GOAL #2   Title  Pt will apply psychosocial skills and coping mechansims to daily activities in order to function independently and reintegrate into community dwelling    Time  4    Period  Weeks    Status  On-going      OT SHORT TERM GOAL #3   Title  Pt will recall and apply 1-3 sleep hygiene strategies to improve engagement in BADL routine    Time  4    Period  Weeks    Status  On-going      OT SHORT TERM GOAL #4   Title  Pt will recall and implement 1-3 time management strategies to improve function in IADL routine    Time  4    Period  Weeks    Status  On-going      OT SHORT TERM GOAL #5   Title  Pt will recall and apply 3-5 stress management strategies for application into community reintegration    Time  4    Period  Weeks    Status  On-going               Plan - 12/28/17 1226    Occupational performance deficits (Please refer to evaluation for details):  ADL's;IADL's;Rest and Sleep;Education;Work;Leisure;Social Participation       Patient will benefit from skilled therapeutic intervention in order to improve the following deficits and impairments:  Decreased coping skills, Decreased psychosocial skills, Other (comment)(decreased ability to engage in BADL and reintegrate into community)  Visit Diagnosis: Organic personality disorder  Difficulty coping    Problem List Patient Active Problem List   Diagnosis Date Noted  . Indication for care in labor and delivery, delivered 12/26/2014  . Indication for care or intervention related to labor and delivery 12/23/2014  . Labor and delivery, indication for care 10/13/2014   [name], MSOT, OTR/L Behavioral Health OT/ Acute Relief OT  [name] 12/28/2017, 12:28 PM  [facility] HOSPITALIZATION PROGRAM [address] [place], [place], [address] Phone: [phone]   Fax:  [phone]  Name: Sonya [name] MRN: [id number] Date of Birth: April 03, 1990

## 2017-12-29 ENCOUNTER — Ambulatory Visit (HOSPITAL_COMMUNITY): Payer: Self-pay

## 2017-12-29 ENCOUNTER — Encounter (HOSPITAL_COMMUNITY): Payer: Self-pay | Admitting: Family

## 2017-12-29 ENCOUNTER — Other Ambulatory Visit (HOSPITAL_COMMUNITY): Payer: Self-pay

## 2017-12-29 NOTE — Psych (Signed)
   [facility] BH PHP THERAPIST PROGRESS NOTE  Sonya [name] [id number]  Session Time: 9:00 -11:00  Participation Level: Active  Behavioral Response: CasualAlertDepressed  Type of Therapy: Group Therapy  Treatment Goals addressed: Coping  Interventions: CBT, DBT, Solution Focused, Supportive and Reframing  Summary:  Clinician led check-in regarding current stressors and situation, and review of patient completed daily inventory. Clinician utilized active listening and empathetic response and validated patient emotions. Clinician facilitated processing group on pertinent issues.  Therapist Response: [name] is a 26 y.o. female who presents with depression and anxiety symptoms. Patient arrived within time allowed and reports that she is feeling down and woke up during a panic attack. Patient rates her mood at a 4 on a scale of 1-10 with 10 being great. Pt. identified SI last night with no plan and states she distracted herself with her daughter. Pt states that she went to [place], watched a movie with her daughter that evening. Patient continues to struggle with depression and concentration. Patient engaged in discussion.       Session Time: 11:00 -12:15  Participation Level: Active  Behavioral Response: CasualAlertDepressed  Type of Therapy: Group Therapy, psychotherapy  Treatment Goals addressed: Coping  Interventions: Strengths based, reframing, Supportive,   Summary:  Spiritual Care group  Therapist Response: Patient engaged in group. See chaplain note.         Session Time: 12:15 - 1:00  Participation Level: Active  Behavioral Response: CasualAlertDepressed  Type of Therapy: Group Therapy  Treatment Goals addressed: Coping  Interventions: Psychologist, occupational, Supportive  Summary:  Reflection Group: Patients encouraged to practice skills and interpersonal techniques or work on mindfulness and relaxation techniques. The importance of self-care and making  skills part of a routine to increase usage were stressed   Therapist Response: Patient engaged and participated appropriately.        Session Time: 1:00- 2:00  Participation Level: Active  Behavioral Response: CasualAlertDepressed  Type of Therapy: Group Therapy, Psychoeducation  Treatment Goals addressed: Coping  Interventions: relaxation training; Supportive; Reframing  Summary: 12:45 - 1:50: Relaxation group: Cln led group focused on retraining the body's response to stress.   1:50 -2:00 Clinician led check-out. Clinician assessed for immediate needs, medication compliance and efficacy, and safety concerns   Therapist Response: Patient engaged in activity and discussion. At check-out, patient rates her mood at a 7.5 on a scale of 1-10 with 10 being great. Patient reports afternoon plans of going to dinner with family to celebrate mother's birthday. Patient demonstrates some progress as evidenced by identifying gratitude in relationship with mother. Patient denies SI/HI/self-harm thoughts at the end of group.        Suicidal/Homicidal: No without intent/plan   Plan: Pt will continue in PHP while working to decrease depression and anxiety symptoms, increase ability to use skills to manage the symptoms, and eliminate SI.  Diagnosis: Severe episode of recurrent major depressive disorder, without psychotic features (HCC) [F33.2]    1. Severe episode of recurrent major depressive disorder, without psychotic features (HCC)     [name], LCSW, LCAS 12/29/2017

## 2018-01-01 ENCOUNTER — Other Ambulatory Visit (HOSPITAL_COMMUNITY): Payer: No Typology Code available for payment source | Admitting: Occupational Therapy

## 2018-01-01 ENCOUNTER — Encounter (HOSPITAL_COMMUNITY): Payer: Self-pay | Admitting: Occupational Therapy

## 2018-01-01 ENCOUNTER — Other Ambulatory Visit (HOSPITAL_COMMUNITY): Payer: No Typology Code available for payment source | Admitting: Licensed Clinical Social Worker

## 2018-01-01 DIAGNOSIS — R4589 Other symptoms and signs involving emotional state: Secondary | ICD-10-CM

## 2018-01-01 DIAGNOSIS — F070 Personality change due to known physiological condition: Secondary | ICD-10-CM

## 2018-01-01 DIAGNOSIS — F332 Major depressive disorder, recurrent severe without psychotic features: Secondary | ICD-10-CM

## 2018-01-01 DIAGNOSIS — F411 Generalized anxiety disorder: Secondary | ICD-10-CM

## 2018-01-01 DIAGNOSIS — F331 Major depressive disorder, recurrent, moderate: Secondary | ICD-10-CM | POA: Diagnosis not present

## 2018-01-01 NOTE — Therapy (Signed)
[facility] PARTIAL HOSPITALIZATION PROGRAM [address] [place], [place], [address] Phone: [phone]   Fax:  [phone]  Occupational Therapy Treatment  Patient Details  Name: Sonya [name] MRN: [id number] Date of Birth: 1990-04-11 Referring Provider (OT): Hillery Jacks, NP   Encounter Date: 01/01/2018  OT End of Session - 01/01/18 1311    Visit Number  6    Number of Visits  16    Date for OT Re-Evaluation  01/15/18    Authorization Type  [employer] employee    OT Start Time  1100    OT Stop Time  1200    OT Time Calculation (min)  60 min    Activity Tolerance  Patient tolerated treatment well    Behavior During Therapy  [facility] for tasks assessed/performed       Past Medical History:  Diagnosis Date  . Anxiety   . Depression   . GERD (gastroesophageal reflux disease) 10/2017  . Meniere disease UNKNOWN  . Vertigo     Past Surgical History:  Procedure Laterality Date  . COLONOSCOPY WITH PROPOFOL N/A 10/25/2017   Procedure: COLONOSCOPY WITH PROPOFOL;  Surgeon: Toledo, Boykin Nearing, MD;  Location: [facility] ENDOSCOPY;  Service: Gastroenterology;  Laterality: N/A;  . ESOPHAGOGASTRODUODENOSCOPY (EGD) WITH PROPOFOL N/A 10/25/2017   Procedure: ESOPHAGOGASTRODUODENOSCOPY (EGD) WITH PROPOFOL;  Surgeon: Toledo, Boykin Nearing, MD;  Location: [facility] ENDOSCOPY;  Service: Gastroenterology;  Laterality: N/A;  . WISDOM TOOTH EXTRACTION    . WRIST SURGERY      There were no vitals filed for this visit.  Subjective Assessment - 01/01/18 1309    Currently in Pain?  No/denies       S: "I am really passive about my stress and want others to handle my problems"   O: Stress management group completed to use as productive coping strategy, to help mitigate maladaptive coping to integrate in functional BADL/IADL. Education given on the definition of stress and its cognitive, behavioral, emotional, and physical effects on the body. Stress symptom checklist completed to raise  insight on current symptoms felt with stress. Stress management tool worksheet discussed to educate on unhealthy vs healthy coping skills to manage stress to improve community integration. Tools worksheet not completed in its entirety this date, to finish next date. Gratitude journaling prompts given at end of session for pt to begin at home.  A: Pt presents to group with blunted affect. Pt engaged and participatory with facilitator and other group members this date. Pt completed stress symptom checklist, reporting pt has very high stress levels. Pt in agrees with score and states she is mostly in the cognitive category of stress symptoms. Stress management tools worksheet completed to describe unhealthy coping strategies. Pt shares that she is passive with her stress/problems toward her husband. Pt accepted gratitude handout. Positive coping skills to be learned next date.  P: Pt provided with education on stress management activities. OT will continue to follow up with activities learned for successful implementation into daily life                     OT Education - 01/01/18 1310    Education Details  education given on stress management skills, to be cont next date    Person(s) Educated  Patient    Methods  Explanation;Handout    Comprehension  Verbalized understanding       OT Short Term Goals - 12/19/17 1400      OT SHORT TERM GOAL #1  Title  Pt will be educated on strategies to improve psychosocial skills needed to participate fully in all daily, work, and leisure activities    Time  4    Period  Weeks    Status  On-going    Target Date  01/15/18      OT SHORT TERM GOAL #2   Title  Pt will apply psychosocial skills and coping mechansims to daily activities in order to function independently and reintegrate into community dwelling    Time  4    Period  Weeks    Status  On-going      OT SHORT TERM GOAL #3   Title  Pt will recall and apply 1-3 sleep hygiene  strategies to improve engagement in BADL routine    Time  4    Period  Weeks    Status  On-going      OT SHORT TERM GOAL #4   Title  Pt will recall and implement 1-3 time management strategies to improve function in IADL routine    Time  4    Period  Weeks    Status  On-going      OT SHORT TERM GOAL #5   Title  Pt will recall and apply 3-5 stress management strategies for application into community reintegration    Time  4    Period  Weeks    Status  On-going               Plan - 01/01/18 1314    Occupational performance deficits (Please refer to evaluation for details):  ADL's;IADL's;Rest and Sleep;Education;Work;Leisure;Social Participation    Consulted and Agree with Plan of Care  Patient       Patient will benefit from skilled therapeutic intervention in order to improve the following deficits and impairments:  Decreased coping skills, Decreased psychosocial skills, Other (comment)(decreased ability ot engage in BADL and reintegrate into community)  Visit Diagnosis: Organic personality disorder  Difficulty coping    Problem List Patient Active Problem List   Diagnosis Date Noted  . Indication for care in labor and delivery, delivered 12/26/2014  . Indication for care or intervention related to labor and delivery 12/23/2014  . Labor and delivery, indication for care 10/13/2014   [name], MSOT, OTR/L Behavioral Health OT/ Acute Relief OT PHP Office: [phone]  [name] 01/01/2018, 1:15 PM  [facility] HOSPITALIZATION PROGRAM [address] [place], [place], [address] Phone: [phone]   Fax:  [phone]  Name: Sonya [name] MRN: [id number] Date of Birth: 21-May-1990

## 2018-01-02 ENCOUNTER — Other Ambulatory Visit (HOSPITAL_COMMUNITY): Payer: No Typology Code available for payment source | Admitting: Occupational Therapy

## 2018-01-02 ENCOUNTER — Other Ambulatory Visit (HOSPITAL_COMMUNITY): Payer: No Typology Code available for payment source | Admitting: Family

## 2018-01-02 ENCOUNTER — Encounter (HOSPITAL_COMMUNITY): Payer: Self-pay | Admitting: Occupational Therapy

## 2018-01-02 DIAGNOSIS — F070 Personality change due to known physiological condition: Secondary | ICD-10-CM

## 2018-01-02 DIAGNOSIS — F331 Major depressive disorder, recurrent, moderate: Secondary | ICD-10-CM | POA: Diagnosis not present

## 2018-01-02 DIAGNOSIS — F332 Major depressive disorder, recurrent severe without psychotic features: Secondary | ICD-10-CM

## 2018-01-02 DIAGNOSIS — F411 Generalized anxiety disorder: Secondary | ICD-10-CM

## 2018-01-02 DIAGNOSIS — R4589 Other symptoms and signs involving emotional state: Secondary | ICD-10-CM

## 2018-01-02 NOTE — Psych (Signed)
   [facility] BH PHP THERAPIST PROGRESS NOTE  [name] [id number]  Session Time: 9:00 -11:00  Participation Level: Active  Behavioral Response: CasualAlertDepressed  Type of Therapy: Group Therapy  Treatment Goals addressed: Coping  Interventions: CBT, DBT, Solution Focused, Supportive and Reframing  Summary:  Clinician led check-in regarding current stressors and situation, and review of patient completed daily inventory. Clinician utilized active listening and empathetic response and validated patient emotions. Clinician facilitated processing group on pertinent issues.  Therapist Response: Sonya [name] is a 27 y.o. female who presents with depression and anxiety symptoms. Patient arrived within time allowed and reports that she "doesn't feel bad, but not amazing." Patient rates her mood at a 7 on a scale of 1-10 with 10 being great. Pt shares her weekend was "up and down." Pt states she and her husband fought off and on, she was stressed about finances, and she reconnected with an old friend. Pt reports struggling with not holding grudges. Pt able to process.  Patient engaged in discussion.      Session Time: 11:00 -12:00   Participation Level: Active   Behavioral Response: CasualAlertDepressed   Type of Therapy: Group Therapy, OT   Treatment Goals addressed: Coping   Interventions: Psychosocial skills training, Supportive,    Summary:  Occupational Therapy group   Therapist Response: Patient engaged in group. See OT note.                 Session Time: 12:00 - 12:45   Participation Level: Active   Behavioral Response: CasualAlertDepressed   Type of Therapy: Group Therapy   Treatment Goals addressed: Coping   Interventions: Psychologist, occupational, Supportive   Summary:  Reflection Group: Patients encouraged to practice skills and interpersonal techniques or work on mindfulness and relaxation techniques. The importance of self-care and making skills part of  a routine to increase usage were stressed    Therapist Response: Patient engaged and participated appropriately.              Session Time: 12:45- 2:00  Participation Level: Active  Behavioral Response: CasualAlertDepressed  Type of Therapy: Group Therapy, Psychoeducation; Psychotherapy  Treatment Goals addressed: Coping  Interventions: CBT; Solution focused; Supportive; Reframing  Summary: 12:45 - 1:50: Clinician introduced topic of cognitive distortions. Cln educated on what cognitive distortions are and how they affect [place]. Cln introduced "Catch, Challenge, Change" and group began reviewing cognitive distortion handout and came up with examples to work on "catch." 1:50 -2:00 Clinician led check-out. Clinician assessed for immediate needs, medication compliance and efficacy, and safety concerns   Therapist Response: Patient engaged activity and discussion. Pt was able to identify real life examples of cognitive distortions discussed.  At check-out, patient rates her mood at a 7 on a scale of 1-10 with 10 being great. Patient reports afternoon plans of getting groceries and seeing a friend.  Patient demonstrates some progress as evidenced by increased prosocial activity. Patient denies SI/HI/self-harm at the end of group.       Suicidal/Homicidal: No without intent/plan   Plan: Pt will continue in PHP while working to decrease depression and anxiety symptoms, increase ability to use skills to manage the symptoms, and eliminate SI.  Diagnosis: Severe episode of recurrent major depressive disorder, without psychotic features (HCC) [F33.2]    1. Severe episode of recurrent major depressive disorder, without psychotic features (HCC)   2. GAD (generalized anxiety disorder)     [name], LCSW, LCAS 01/02/2018

## 2018-01-02 NOTE — Psych (Signed)
   [facility] BH PHP THERAPIST PROGRESS NOTE  Sonya [name] [id number]  Session Time: 9:00 -11:00  Participation Level: Active  Behavioral Response: CasualAlertDepressed  Type of Therapy: Group Therapy  Treatment Goals addressed: Coping  Interventions: CBT, DBT, Solution Focused, Supportive and Reframing  Summary:  Clinician led check-in regarding current stressors and situation, and review of patient completed daily inventory. Clinician utilized active listening and empathetic response and validated patient emotions. Clinician facilitated processing group on pertinent issues.  Therapist Response: [name] is a 27 y.o. female who presents with depression and anxiety symptoms. Patient arrived within time allowed and reports that she is feeling "neutral" and not particularly anxious or upset. Patient rates her mood at a 7 on a scale of 1-10 with 10 being great. Pt reports that she had dinner with her mom and family. Pt states that dinner went well and she used yoga nidra to help her go to sleep. Patient continues to struggle with toxic friendships. Patient engaged in discussion.      Session Time: 11:00 -12:00   Participation Level: Active   Behavioral Response: CasualAlertDepressed   Type of Therapy: Group Therapy, OT   Treatment Goals addressed: Coping   Interventions: Psychosocial skills training, Supportive,    Summary:  Occupational Therapy group   Therapist Response: Patient engaged in group. See OT note.                 Session Time: 12:00 - 12:45   Participation Level: Active   Behavioral Response: CasualAlertDepressed   Type of Therapy: Group Therapy   Treatment Goals addressed: Coping   Interventions: Psychologist, occupational, Supportive   Summary:  Reflection Group: Patients encouraged to practice skills and interpersonal techniques or work on mindfulness and relaxation techniques. The importance of self-care and making skills part of a routine to  increase usage were stressed    Therapist Response: Patient engaged and participated appropriately.              Session Time: 12:45 - 2:00   Participation Level: Active   Behavioral Response: CasualAlertDepressed   Type of Therapy: Group Therapy, Psychotherapy; Psychoeducation   Treatment Goals addressed: Coping   Interventions: CBT, Solution focused, Supportive, Reframing   Summary: 12:45 - 1:50 Cln introduced distress tolerance skills, reviewing their purpose and how to practice them. Cln introduced STOP skill and began ACCEPTS skill. Pt's discussed how they could utilize these skills.  1:50 -2:00 Clinician led check-out. Clinician assessed for immediate needs, medication compliance and efficacy, and safety concerns      Therapist Response:Patient engaged in discussion. Pt reports understanding of skills discussed and is able to brainstorm ways they could apply them.  At check-out, patient rates her mood at a 8 on a scale of 1-10 with 10 being great. Patient reports that she does not have afternoon plans. Patient demonstrates some progress as evidenced by recognizing which DBT skills will work for her. Patient denies SI/HI/self-harm thoughts at the end of group.       Suicidal/Homicidal: No without intent/plan   Plan: Pt will continue in PHP while working to decrease depression and anxiety symptoms, increase ability to use skills to manage the symptoms, and eliminate SI.  Diagnosis: Severe episode of recurrent major depressive disorder, without psychotic features (HCC) [F33.2]    1. Severe episode of recurrent major depressive disorder, without psychotic features (HCC)   2. GAD (generalized anxiety disorder)     [name], LCSW, LCAS 01/02/2018

## 2018-01-02 NOTE — Therapy (Signed)
[facility] PARTIAL HOSPITALIZATION PROGRAM [address] [place], [place], [address] Phone: [phone]   Fax:  [phone]  Occupational Therapy Treatment  Patient Details  Name: Yamile Roedl MRN: [id number] Date of Birth: 1990/04/23 Referring Provider (OT): Hillery Jacks, NP   Encounter Date: 01/02/2018  OT End of Session - 01/02/18 1205    Visit Number  7    Number of Visits  16    Date for OT Re-Evaluation  01/15/18    Authorization Type  [employer], [employer] employee    OT Start Time  1030    OT Stop Time  1140    OT Time Calculation (min)  70 min    Activity Tolerance  Patient tolerated treatment well    Behavior During Therapy  [facility] for tasks assessed/performed       Past Medical History:  Diagnosis Date  . Anxiety   . Depression   . GERD (gastroesophageal reflux disease) 10/2017  . Meniere disease UNKNOWN  . Vertigo     Past Surgical History:  Procedure Laterality Date  . COLONOSCOPY WITH PROPOFOL N/A 10/25/2017   Procedure: COLONOSCOPY WITH PROPOFOL;  Surgeon: Toledo, Boykin Nearing, MD;  Location: [facility] ENDOSCOPY;  Service: Gastroenterology;  Laterality: N/A;  . ESOPHAGOGASTRODUODENOSCOPY (EGD) WITH PROPOFOL N/A 10/25/2017   Procedure: ESOPHAGOGASTRODUODENOSCOPY (EGD) WITH PROPOFOL;  Surgeon: Toledo, Boykin Nearing, MD;  Location: [facility] ENDOSCOPY;  Service: Gastroenterology;  Laterality: N/A;  . WISDOM TOOTH EXTRACTION    . WRIST SURGERY      There were no vitals filed for this visit.  Subjective Assessment - 01/02/18 1205    Currently in Pain?  No/denies        S: "I like the idea of practicing positive affirmations"  O: Continued education given on stress management this date. Stress management tool worksheet discussed to educate on unhealthy vs healthy coping skills to manage stress to improve community integration. Coping strategies taught include: relaxation based- deep breathing, counting to 10, taking a 1 minute vacation, acceptance, stress  balls, relaxation audio/video, visual/mental imagery. Positive mental attitude- gratitude, acceptance, cognitive reframing, positive self talk, anger management. Mood tracker journal given at end of session.  A: Pt presents to group with blunted affect. Pt engaged and participatory with facilitator and other group members this date. Pt shares that she would like to practice positive affirmations by downloading an app that will send them to her phone every day. She also shares that she has been using deep breathing at baseline, which is helpful for her. Mood tracker accepted by pt at end of session.  P: Pt provided with education on stress management activities. OT will continue to follow up with activities learned for successful implementation into daily life                      OT Education - 01/02/18 1205    Education Details  continued education given on stress management skills    Person(s) Educated  Patient    Methods  Explanation;Handout    Comprehension  Verbalized understanding       OT Short Term Goals - 12/19/17 1400      OT SHORT TERM GOAL #1   Title  Pt will be educated on strategies to improve psychosocial skills needed to participate fully in all daily, work, and leisure activities    Time  4    Period  Weeks    Status  On-going    Target Date  01/15/18  OT SHORT TERM GOAL #2   Title  Pt will apply psychosocial skills and coping mechansims to daily activities in order to function independently and reintegrate into community dwelling    Time  4    Period  Weeks    Status  On-going      OT SHORT TERM GOAL #3   Title  Pt will recall and apply 1-3 sleep hygiene strategies to improve engagement in BADL routine    Time  4    Period  Weeks    Status  On-going      OT SHORT TERM GOAL #4   Title  Pt will recall and implement 1-3 time management strategies to improve function in IADL routine    Time  4    Period  Weeks    Status  On-going      OT  SHORT TERM GOAL #5   Title  Pt will recall and apply 3-5 stress management strategies for application into community reintegration    Time  4    Period  Weeks    Status  On-going               Plan - 01/02/18 1206    Occupational performance deficits (Please refer to evaluation for details):  ADL's;IADL's;Rest and Sleep;Education;Work;Leisure;Social Participation       Patient will benefit from skilled therapeutic intervention in order to improve the following deficits and impairments:  Decreased coping skills, Decreased psychosocial skills, Other (comment)(decreased ability to engage in BADL and reintegrate into community)  Visit Diagnosis: Organic personality disorder  Difficulty coping    Problem List Patient Active Problem List   Diagnosis Date Noted  . Indication for care in labor and delivery, delivered 12/26/2014  . Indication for care or intervention related to labor and delivery 12/23/2014  . Labor and delivery, indication for care 10/13/2014   [name], MSOT, OTR/L Behavioral Health OT/ Acute Relief OT PHP Office: [phone]  [name] 01/02/2018, 12:08 PM  [facility] HOSPITALIZATION PROGRAM [address] [place], [place], [address] Phone: [phone]   Fax:  [phone]  Name: Abie Killian MRN: [id number] Date of Birth: 07-17-1990

## 2018-01-03 ENCOUNTER — Encounter (HOSPITAL_COMMUNITY): Payer: Self-pay

## 2018-01-03 ENCOUNTER — Other Ambulatory Visit (HOSPITAL_COMMUNITY): Payer: No Typology Code available for payment source | Admitting: Licensed Clinical Social Worker

## 2018-01-03 VITALS — BP 106/70 | HR 85 | Ht 62.0 in | Wt 210.0 lb

## 2018-01-03 DIAGNOSIS — F411 Generalized anxiety disorder: Secondary | ICD-10-CM

## 2018-01-03 DIAGNOSIS — F331 Major depressive disorder, recurrent, moderate: Secondary | ICD-10-CM | POA: Diagnosis not present

## 2018-01-03 DIAGNOSIS — F332 Major depressive disorder, recurrent severe without psychotic features: Secondary | ICD-10-CM

## 2018-01-03 MED ORDER — NICOTINE 14 MG/24HR TD PT24: 14.0000 mg | MEDICATED_PATCH | Freq: Every day | TRANSDERMAL | 0 refills | Status: DC

## 2018-01-03 NOTE — Psych (Signed)
   [facility] PHP THERAPIST PROGRESS NOTE  Sonya [name] [id number]  Session Time: 9:00 -11:00  Participation Level: Active  Behavioral Response: CasualAlertDepressed  Type of Therapy: Group Therapy  Treatment Goals addressed: Coping  Interventions: CBT, DBT, Solution Focused, Supportive and Reframing  Summary:  Clinician led check-in regarding current stressors and situation, and review of patient completed daily inventory. Clinician utilized active listening and empathetic response and validated patient emotions. Clinician facilitated processing group on pertinent issues.  Therapist Response: Sonya [name] is a 27 y.o. female who presents with depression and anxiety symptoms. Patient arrived within time allowed and reports she feels "good." Patient rates her mood at a 8 on a scale of 1-10 with 10 being great. Pt shares she has been dealing with a headache however her mood is positive. Pt shares she spent more time with her reunited friend and is happy to have a new support. Pt states she and her husband had a "kind of fight" because she did not do her daily marriage homework. Pt reports continued issues with managing financial stress. Pt able to process.  Patient engaged in discussion.      Session Time: 11:00 -12:00   Participation Level: Active   Behavioral Response: CasualAlertDepressed   Type of Therapy: Group Therapy, OT   Treatment Goals addressed: Coping   Interventions: Psychosocial skills training, Supportive,    Summary:  Occupational Therapy group   Therapist Response: Patient engaged in group. See OT note.                 Session Time: 12:00 - 12:45   Participation Level: Active   Behavioral Response: CasualAlertDepressed   Type of Therapy: Group Therapy   Treatment Goals addressed: Coping   Interventions: Psychologist, occupational, Supportive   Summary:  Reflection Group: Patients encouraged to practice skills and interpersonal techniques or work on  mindfulness and relaxation techniques. The importance of self-care and making skills part of a routine to increase usage were stressed    Therapist Response: Patient engaged and participated appropriately.              Session Time: 12:45- 2:00  Participation Level: Active  Behavioral Response: CasualAlertDepressed  Type of Therapy: Group Therapy, Psychoeducation; Psychotherapy  Treatment Goals addressed: Coping  Interventions: CBT; Solution focused; Supportive; Reframing  Summary: 12:45 - 1:50: Clinician continued topic of cognitive distortions. Group continued to review cognitive distortion handout and come up with examples to work on "catch." 1:50 -2:00 Clinician led check-out. Clinician assessed for immediate needs, medication compliance and efficacy, and safety concerns   Therapist Response: Patient engaged activity and discussion. Pt reports understanding and is able to apply to her life.  At check-out, patient rates her mood at a 7 on a scale of 1-10 with 10 being great. Patient reports afternoon plans of picking up her medications and doing her marriage homework.  Patient demonstrates some progress as evidenced by overall increase in mood. Patient denies SI/HI/self-harm at the end of group.     Suicidal/Homicidal: No without intent/plan   Plan: Pt will continue in PHP while working to decrease depression and anxiety symptoms, increase ability to use skills to manage the symptoms, and eliminate SI.  Diagnosis: Severe episode of recurrent major depressive disorder, without psychotic features (HCC) [F33.2]    1. Severe episode of recurrent major depressive disorder, without psychotic features (HCC)   2. GAD (generalized anxiety disorder)     [name], LCSW, LCAS 01/03/2018

## 2018-01-03 NOTE — Progress Notes (Signed)
Patient presented with sad affect, depressed mood but reported for the past 24 hours she has had no suicidal thoughts even though she often gets these passive thoughts most days.  Reports she just started taking Zoloft and Tegretol-XR the previous day as she has not been able to afford them by report.  Patient denies any suicidal or homicidal ideations at this time, no plan or intent and reports things are "going okay" with her and her husband reconciliation efforts.  States they are using the 40 day "Love Dare" book to help rebuild their marriage and while she does not agree with all parts of it, does feel it is helpful and has her and her husband communicating more.  Patient reported plans to take her daughter out trick-or-treating tonight for Halloween the night before due to weather and plans to dress up with her. Patient rated her current level of depression a 2.5, anxiety a 5, and hopelessness a 7 on a scale of 0-10 with 0 being none and 10 the worst she could manage.  Patient reported feeling PHP groups have been helpful and discussed use of learned skills to help with management of emotions and in daily life.  Patient admitted she has gradually started smoking more again after being stopped for 3 years and now is up to a 1/2 pack each day.  Reports this helps with anxiety but knows it is not good for her health and would like to stop.  Patient will discuss trying Nicotine patches to help with stopping and agreed to send message to Hillery Jacks, NP that she would like to discuss this with her.  Patient reported no other concerns today, encouraged her to make sure to take medications daily and she agreed to inform of any worsening symptoms or problems with medications.  Patient returned to group with denial of any active suicidal ideations, plans or intent at this time.

## 2018-01-04 ENCOUNTER — Other Ambulatory Visit (HOSPITAL_COMMUNITY): Payer: No Typology Code available for payment source | Admitting: Licensed Clinical Social Worker

## 2018-01-04 ENCOUNTER — Other Ambulatory Visit (HOSPITAL_COMMUNITY): Payer: No Typology Code available for payment source | Admitting: Occupational Therapy

## 2018-01-04 ENCOUNTER — Encounter (HOSPITAL_COMMUNITY): Payer: Self-pay | Admitting: Occupational Therapy

## 2018-01-04 DIAGNOSIS — F332 Major depressive disorder, recurrent severe without psychotic features: Secondary | ICD-10-CM

## 2018-01-04 DIAGNOSIS — R4589 Other symptoms and signs involving emotional state: Secondary | ICD-10-CM

## 2018-01-04 DIAGNOSIS — F331 Major depressive disorder, recurrent, moderate: Secondary | ICD-10-CM | POA: Diagnosis not present

## 2018-01-04 DIAGNOSIS — F070 Personality change due to known physiological condition: Secondary | ICD-10-CM

## 2018-01-04 NOTE — Therapy (Signed)
[facility] PARTIAL HOSPITALIZATION PROGRAM [address] [place], [place], [address] Phone: [phone]   Fax:  [phone]  Occupational Therapy Treatment  Patient Details  Name: Sonya [name] MRN: [id number] Date of Birth: April 21, 1990 Referring Provider (OT): Hillery Jacks, NP   Encounter Date: 01/04/2018  OT End of Session - 01/04/18 1252    Visit Number  8    Number of Visits  16    Date for OT Re-Evaluation  01/15/18    Authorization Type  [employer] employee    OT Start Time  1100    OT Stop Time  1200    OT Time Calculation (min)  60 min    Activity Tolerance  Patient tolerated treatment well    Behavior During Therapy  [facility] for tasks assessed/performed       Past Medical History:  Diagnosis Date  . Anxiety   . Depression   . GERD (gastroesophageal reflux disease) 10/2017  . Meniere disease UNKNOWN  . Vertigo     Past Surgical History:  Procedure Laterality Date  . COLONOSCOPY WITH PROPOFOL N/A 10/25/2017   Procedure: COLONOSCOPY WITH PROPOFOL;  Surgeon: Toledo, Boykin Nearing, MD;  Location: [facility] ENDOSCOPY;  Service: Gastroenterology;  Laterality: N/A;  . ESOPHAGOGASTRODUODENOSCOPY (EGD) WITH PROPOFOL N/A 10/25/2017   Procedure: ESOPHAGOGASTRODUODENOSCOPY (EGD) WITH PROPOFOL;  Surgeon: Toledo, Boykin Nearing, MD;  Location: [facility] ENDOSCOPY;  Service: Gastroenterology;  Laterality: N/A;  . WISDOM TOOTH EXTRACTION    . WRIST SURGERY      There were no vitals filed for this visit.  Subjective Assessment - 01/04/18 1252    Currently in Pain?  No/denies         O: Education given on definition and importance of positive self-esteem in daily life and relationships with focus on using positive self-talk. Further education given on the relationship between self-esteem and mental illness and both high and low self-esteem factors. Worksheet given for pt to identify a positive trait about themselves with each letter of the alphabet to use as reference  for future times of need and to gain insight on numerous positive qualities. Pt to share with other group members at end of session.  A: Pt presents to group with blunted affect, engaged and participatory throughout entirety of session. Pt engaged in discussion of self esteem and its relation to mental health, sharing that she was affected at a young age by losing her father. She also shares that the "picture perfect expectation of beauty" in the media has affected her self esteem over the years. Pt completed A-Z activity with moderate VC's and with her sharing that she is only thinking of negative words. Pt then successful completed activity with notable improved affect.   P: Pt provided with self-esteem boosting skills to implement into a variety of daily activities/routines. OT will continue to follow up for successful implementation into daily life.                   OT Education - 01/04/18 1252    Education Details  education given on self esteem    Person(s) Educated  Patient    Methods  Explanation;Handout    Comprehension  Verbalized understanding       OT Short Term Goals - 12/19/17 1400      OT SHORT TERM GOAL #1   Title  Pt will be educated on strategies to improve psychosocial skills needed to participate fully in all daily, work, and leisure activities  Time  4    Period  Weeks    Status  On-going    Target Date  01/15/18      OT SHORT TERM GOAL #2   Title  Pt will apply psychosocial skills and coping mechansims to daily activities in order to function independently and reintegrate into community dwelling    Time  4    Period  Weeks    Status  On-going      OT SHORT TERM GOAL #3   Title  Pt will recall and apply 1-3 sleep hygiene strategies to improve engagement in BADL routine    Time  4    Period  Weeks    Status  On-going      OT SHORT TERM GOAL #4   Title  Pt will recall and implement 1-3 time management strategies to improve function in IADL  routine    Time  4    Period  Weeks    Status  On-going      OT SHORT TERM GOAL #5   Title  Pt will recall and apply 3-5 stress management strategies for application into community reintegration    Time  4    Period  Weeks    Status  On-going               Plan - 01/04/18 1253    Occupational performance deficits (Please refer to evaluation for details):  ADL's;IADL's;Rest and Sleep;Education;Work;Leisure;Social Participation       Patient will benefit from skilled therapeutic intervention in order to improve the following deficits and impairments:  Decreased coping skills, Decreased psychosocial skills, Other (comment)(decreased ability to engage in BADL and reintegrate into community)  Visit Diagnosis: Organic personality disorder  Difficulty coping    Problem List Patient Active Problem List   Diagnosis Date Noted  . Indication for care in labor and delivery, delivered 12/26/2014  . Indication for care or intervention related to labor and delivery 12/23/2014  . Labor and delivery, indication for care 10/13/2014   [name], MSOT, OTR/L Behavioral Health OT/ Acute Relief OT PHP Office: [phone]  [name] 01/04/2018, 12:55 PM  [facility] HOSPITALIZATION PROGRAM [address] [place], [place], [address] Phone: [phone]   Fax:  [phone]  Name: Sonya [name] MRN: [id number] Date of Birth: 1990/04/30

## 2018-01-04 NOTE — Psych (Signed)
   [facility] PHP THERAPIST PROGRESS NOTE  Sonya [name] [id number]  Session Time: 9:00 -11:00  Participation Level: Active  Behavioral Response: CasualAlertDepressed  Type of Therapy: Group Therapy  Treatment Goals addressed: Coping  Interventions: CBT, DBT, Solution Focused, Supportive and Reframing  Summary:  Clinician led check-in regarding current stressors and situation, and review of patient completed daily inventory. Clinician utilized active listening and empathetic response and validated patient emotions. Clinician facilitated processing group on pertinent issues.  Therapist Response: Sonya [name] is a 27 y.o. female who presents with depression and anxiety symptoms. Patient arrived within time allowed and reports that she is feeling good. Patient rates her mood at a 9 on a scale of 1-10 with 10 being great. Pt states that she spoke with her husband about her mental illness and it went well. Pt states that she picked up meds, visited grandmother and went to soccer game. Pt. states that boss texted to meet w/ her and she is "nervous" about meeting. Patient continues to struggle with accepting unknowns in her life. Pt able to process. Patient engaged in discussion.         Session Time: 11:00 -12:15  Participation Level: Active  Behavioral Response: CasualAlertDepressed  Type of Therapy: Group Therapy, psychotherapy  Treatment Goals addressed: Coping  Interventions: Strengths based, reframing, Supportive,   Summary:  Spiritual Care group  Therapist Response: Patient engaged in group. See chaplain note.         Session Time: 12:15 - 1:00  Participation Level: Active  Behavioral Response: CasualAlertDepressed  Type of Therapy: Group Therapy  Treatment Goals addressed: Coping  Interventions: Psychologist, occupational, Supportive  Summary:  Reflection Group: Patients encouraged to practice skills and interpersonal techniques or work on mindfulness and  relaxation techniques. The importance of self-care and making skills part of a routine to increase usage were stressed   Therapist Response: Patient engaged and participated appropriately.        Session Time: 1:00- 2:00  Participation Level: Active  Behavioral Response: CasualAlertDepressed  Type of Therapy: Group Therapy, Psychoeducation  Treatment Goals addressed: Coping  Interventions: relaxation training; Supportive; Reframing  Summary: 12:45 - 1:50: Relaxation group: Cln led group focused on retraining the body's response to stress.   1:50 -2:00 Clinician led check-out. Clinician assessed for immediate needs, medication compliance and efficacy, and safety concerns   Therapist Response: Patient engaged in activity and discussion. At check-out, patient rates her mood at a 9 on a scale of 1-10 with 10 being great. Patient reports afternoon plans of going to softball game and going out w/ daughter. Patient demonstrates some progress as evidenced by increased communication with support system. Patient denies SI/HI/self-harm thoughts at the end of group.       Suicidal/Homicidal: No without intent/plan   Plan: Pt will continue in PHP while working to decrease depression and anxiety symptoms, increase ability to use skills to manage the symptoms, and eliminate SI.  Diagnosis: Severe episode of recurrent major depressive disorder, without psychotic features (HCC) [F33.2]    1. Severe episode of recurrent major depressive disorder, without psychotic features (HCC)   2. GAD (generalized anxiety disorder)     [name], LCSW, LCAS 01/04/2018

## 2018-01-05 ENCOUNTER — Other Ambulatory Visit (HOSPITAL_COMMUNITY): Payer: Self-pay

## 2018-01-05 ENCOUNTER — Ambulatory Visit (HOSPITAL_COMMUNITY): Payer: Self-pay

## 2018-01-05 NOTE — Progress Notes (Signed)
GROUP NOTE - spiritual care group 01/03/2018 11:00 - 12:00 ?Facilitated by Wilkie Aye, MDiv, [facility].  ? Group focused on topic of strength. ?Group members reflected on what thoughts and feelings emerge when they hear this topic. ?They then engaged in facilitated dialog around how strength is present in their lives. This dialog focused on representing what strength had been to them in their lives (images and patterns given) and what they saw as helpful in their life now (what they needed / wanted). ?  ?  Activity drew on narrative framework.   Hadasah was present throughout group.  Alert and engaged in conversation voluntarily.  Jinny noted strength as "trying to find myself."  Spoke about dynamics of her relationship with spouse.  She entered into this relationship at a young age and felt that she did not have many connections outside of relationship.  Is working to be more vocal with spouse about her needs and hoping to work on this relationship together.     Burnis Kingfisher, MDiv, [facility]

## 2018-01-07 ENCOUNTER — Encounter (HOSPITAL_COMMUNITY): Payer: Self-pay | Admitting: Family

## 2018-01-07 NOTE — Progress Notes (Signed)
BH MD/PA/NP OP Progress Note  01/07/2018 1:38 PM Sonya [name]  MRN:  [id number]   Sonya [name] seen attending daily group session. Continues to report her mood is  "all over the place, I really think I am bipolar." Patient was initiated on tegretol 100 mg by mouth twice daily and Zoloft 50 mg on by mouth daily on 10/23. However patient reports she hasn't had time to pickup medications and will start taken medications by Friday of this week. Patient reports passive suicidal ideations Sonya [name] states . " I always have thoughts."  Patient is able to contract for safety, denies plan or intent. Patient reports " I wouldn't act on thoughts because of my family." reports her relationship with her husband is getting better. Sonya [name] states feeling indifferent related to returning back to work.  " I need to be able to focus when I return. "  Patient reports she feels that group sessions has been helpful. Reports she is resting well throughout the night. States a good appetite.  Patient continues to express concerns with depression, anxiety and mood irritability.  Chart was reviewed; RN has requesting nicotine patch at patient follow-assessment.  NP to follow-up with medication tolerance on next week.  Support, encouragement and reassurance was provided.    History: Per [facility] assessment note: Pt presents referred by her psychiatrist. Pt states the referral was made a few months ago but pt couldn't because of work. Pt states yesterday she was fired due to absences related to her health. Pt reports she is also trying to separate from her husband which is stressful. Pt states history of depression and anxiety and reports increasingly reckless behaviors over the past week including sex with strangers from the internet and maxing out her credit cards. Pt reports recent suicial ideation including considering overdosing and shooting herself. Pt states her husband has removed all the guns from the home and flushed the medication she  could have used to overdose.    Visit Diagnosis:    ICD-10-CM   1. GAD (generalized anxiety disorder) F41.1   2. Severe episode of recurrent major depressive disorder, without psychotic features (HCC) F33.2     Past Psychiatric History:  Past Medical History:  Past Medical History:  Diagnosis Date  . Anxiety   . Depression   . GERD (gastroesophageal reflux disease) 10/2017  . Meniere disease UNKNOWN  . Vertigo     Past Surgical History:  Procedure Laterality Date  . COLONOSCOPY WITH PROPOFOL N/A 10/25/2017   Procedure: COLONOSCOPY WITH PROPOFOL;  Surgeon: Toledo, Boykin Nearing, MD;  Location: [facility];  Service: Gastroenterology;  Laterality: N/A;  . ESOPHAGOGASTRODUODENOSCOPY (EGD) WITH PROPOFOL N/A 10/25/2017   Procedure: ESOPHAGOGASTRODUODENOSCOPY (EGD) WITH PROPOFOL;  Surgeon: Toledo, Boykin Nearing, MD;  Location: [facility];  Service: Gastroenterology;  Laterality: N/A;  . WISDOM TOOTH EXTRACTION    . WRIST SURGERY      Family Psychiatric History:  Family History:  Family History  Problem Relation Age of Onset  . Graves' disease Mother   . Physical abuse Mother   . Drug abuse Father   . Alcohol abuse Father   . Seizures Father   . Drug abuse Sister   . Anxiety disorder Sister   . Depression Brother   . Anxiety disorder Brother   . Bipolar disorder Brother     Social History:  Social History   Socioeconomic History  . Marital status: Married    Spouse name: calvin  . Number of children: 1  . Years of  education: Not on file  . Highest education level: Associate degree: occupational, Scientist, product/process development, or vocational program  Occupational History    Comment: not employed  Social Needs  . Financial resource strain: Hard  . Food insecurity:    Worry: Often true    Inability: Often true  . Transportation needs:    Medical: No    Non-medical: No  Tobacco Use  . Smoking status: Heavy Tobacco Smoker    Packs/day: 0.50    Types: Cigarettes  . Smokeless tobacco:  Never Used  . Tobacco comment: Recently started and reports helps with anxiety.   Substance and Sexual Activity  . Alcohol use: Not Currently    Comment: Occasional drinks with dinner twice a month   . Drug use: No  . Sexual activity: Yes    Partners: Male    Birth control/protection: Pill  Lifestyle  . Physical activity:    Days per week: 0 days    Minutes per session: 0 min  . Stress: Not on file  Relationships  . Social connections:    Talks on phone: More than three times a week    Gets together: More than three times a week    Attends religious service: Never    Active member of club or organization: No    Attends meetings of clubs or organizations: Never    Relationship status: Married  Other Topics Concern  . Not on file  Social History Narrative  . Not on file    Allergies:  Allergies  Allergen Reactions  . Adipex-P [Phentermine] Shortness Of Breath, Swelling, Palpitations and Hypertension    Tunnel vision, chest pain, headache, and feet became swollen  . Codeine Nausea And Vomiting  . Adhesive [Tape] Rash    Patient prefers paper tape    Metabolic Disorder Labs: No results found for: HGBA1C, MPG No results found for: PROLACTIN No results found for: CHOL, TRIG, HDL, CHOLHDL, VLDL, LDLCALC Lab Results  Component Value Date   TSH 2.61 08/15/2013    Therapeutic Level Labs: No results found for: LITHIUM No results found for: VALPROATE No components found for:  CBMZ  Current Medications: Current Outpatient Medications  Medication Sig Dispense Refill  . Aspirin-Acetaminophen-Caffeine (GOODY HEADACHE PO) Take 1 packet by mouth once as needed (for headaches).    . carbamazepine (TEGRETOL-XR) 100 MG 12 hr tablet Take 1 tablet (100 mg total) by mouth 2 (two) times daily. 60 tablet 0  . Dexlansoprazole (DEXILANT) 30 MG capsule Take 30 mg by mouth daily.    . Ferrous Sulfate (IRON) 28 MG TABS Take 28 mg by mouth daily.    . fluticasone (FLONASE) 50 MCG/ACT nasal  spray Place 1 spray into both nostrils daily.   12  . Homeopathic Products (AVENOC EX) Apply topically.    Marland Kitchen ibuprofen (ADVIL,MOTRIN) 800 MG tablet Take 800 mg by mouth every 6 (six) hours as needed (for pain or migraines).    . Lactobacillus Rhamnosus, GG, ([employer] PROBIOTIC, LACTOBACILLUS,) CAPS Take 1 capsule by mouth daily.    . meclizine (ANTIVERT) 25 MG tablet Take 25 mg by mouth 3 (three) times daily as needed (for vertigo).   0  . Multiple Vitamins-Minerals (ONE-A-DAY WOMENS VITACRAVES) CHEW Chew 2 tablets by mouth daily.    . nicotine (NICODERM CQ) 14 mg/24hr patch Place 1 patch (14 mg total) onto the skin daily. 28 patch 0  . nitrofurantoin, macrocrystal-monohydrate, (MACROBID) 100 MG capsule Take 1 capsule (100 mg total) by mouth 2 (two) times daily. 1  po BId (Patient not taking: Reported on 12/27/2017) 10 capsule 0  . ondansetron (ZOFRAN ODT) 4 MG disintegrating tablet Take 1 tablet (4 mg total) by mouth every 6 (six) hours as needed for nausea or vomiting. 20 tablet 0  . pantoprazole (PROTONIX) 40 MG tablet Take 40 mg by mouth daily before breakfast.     . promethazine (PHENERGAN) 25 MG tablet Take 1 tablet (25 mg total) by mouth every 6 (six) hours as needed for nausea or vomiting. (Patient not taking: Reported on 12/18/2017) 20 tablet 0  . propranolol (INDERAL) 10 MG tablet Take 1 tablet (10 mg total) by mouth 2 (two) times daily as needed. For severe anxiety sx (Patient not taking: Reported on 12/18/2017) 180 tablet 0  . sertraline (ZOLOFT) 50 MG tablet Take 1 tablet (50 mg total) by mouth daily. 60 tablet 0  . triamterene-hydrochlorothiazide (DYAZIDE) 37.5-25 MG capsule Take 1 capsule by mouth daily.  5  . VENTOLIN HFA 108 (90 Base) MCG/ACT inhaler Inhale 2 puffs into the lungs every 6 (six) hours as needed for wheezing or shortness of breath.      No current facility-administered medications for this visit.      Musculoskeletal: Strength & Muscle Tone: within normal limits Gait &  Station: normal Patient leans: Right  Psychiatric Specialty Exam: ROS  unknown if currently breastfeeding.There is no height or weight on file to calculate BMI.  General Appearance: Casual  Eye Contact:  Fair  Speech:  Clear and Coherent  Volume:  Normal  Mood:  Anxious, Depressed and Irritable  Affect:  Congruent  Thought Process:  Coherent  Orientation:  Full (Time, Place, and Person)  Thought Content: Logical   Suicidal Thoughts:  Yes.  with intent/plan patient is able to contract for safety with crisis plan in place.   Homicidal Thoughts:  No  Memory:  Immediate;   Fair Recent;   Fair Remote;   Fair  Judgement:  Fair  Insight:  Fair  Psychomotor Activity:  Normal  Concentration:  Concentration: Fair  Recall:  [employer] of Knowledge: Fair  Language: Fair  Akathisia:  No  Handed:  Right  AIMS (if indicated):   Assets:  Communication Skills Desire for Improvement Resilience Social Support  ADL's:  Intact  Cognition: WNL  Sleep:  Fair   Screenings: GAD-7     Counselor from 12/18/2017 in BE[facility]AVIORAL [facility]ALTH PARTIAL HOSPITALIZATION PROGRAM  Total GAD-7 Score  20    PHQ2-9     Counselor from 12/18/2017 in BEHAVIORAL HEALTH PARTIAL HOSPITALIZATION PROGRAM  PHQ-2 Total Score  4  PHQ-9 Total Score  21       Assessment and Plan:  Continue PHP - daily group session Medication management:                Start Zoloft 50 mg and Tegretol 100 mg Po BID for mood stabliziation as patient reports she hasn't taken medication as of yet.  Employment Leave form was completed on 12/29/2017. Consider referral for Dialectical Behavior Therapy (DBT) at discharge  Treatment plan was reviewed and agreed upon by NPT  and patient'sSummer Hardenneed for continued  group services.   Oneta Rack, NP 01/07/2018, 1:38 PM

## 2018-01-08 ENCOUNTER — Other Ambulatory Visit (HOSPITAL_COMMUNITY): Payer: No Typology Code available for payment source | Admitting: Occupational Therapy

## 2018-01-08 ENCOUNTER — Encounter (HOSPITAL_COMMUNITY): Payer: Self-pay | Admitting: Occupational Therapy

## 2018-01-08 ENCOUNTER — Other Ambulatory Visit (HOSPITAL_COMMUNITY)
Payer: No Typology Code available for payment source | Attending: Psychiatry | Admitting: Licensed Clinical Social Worker

## 2018-01-08 ENCOUNTER — Encounter (HOSPITAL_COMMUNITY): Payer: Self-pay | Admitting: Family

## 2018-01-08 DIAGNOSIS — R4589 Other symptoms and signs involving emotional state: Secondary | ICD-10-CM

## 2018-01-08 DIAGNOSIS — F411 Generalized anxiety disorder: Secondary | ICD-10-CM | POA: Insufficient documentation

## 2018-01-08 DIAGNOSIS — F332 Major depressive disorder, recurrent severe without psychotic features: Secondary | ICD-10-CM | POA: Insufficient documentation

## 2018-01-08 DIAGNOSIS — F070 Personality change due to known physiological condition: Secondary | ICD-10-CM

## 2018-01-08 NOTE — Therapy (Signed)
[facility] PARTIAL HOSPITALIZATION PROGRAM [address] [place], [place], [address] Phone: [phone]   Fax:  [phone]  Occupational Therapy Treatment  Patient Details  Name: Sonya [name] MRN: [id number] Date of Birth: 1990/07/12 Referring Provider (OT): Hillery Jacks, NP   Encounter Date: 01/08/2018  OT End of Session - 01/08/18 1358    Visit Number  9    Number of Visits  16    Date for OT Re-Evaluation  01/15/18    Authorization Type  [employer] employee    OT Start Time  1110    OT Stop Time  1215    OT Time Calculation (min)  65 min    Activity Tolerance  Patient tolerated treatment well    Behavior During Therapy  [facility] for tasks assessed/performed       Past Medical History:  Diagnosis Date  . Anxiety   . Depression   . GERD (gastroesophageal reflux disease) 10/2017  . Meniere disease UNKNOWN  . Vertigo     Past Surgical History:  Procedure Laterality Date  . COLONOSCOPY WITH PROPOFOL N/A 10/25/2017   Procedure: COLONOSCOPY WITH PROPOFOL;  Surgeon: Toledo, Boykin Nearing, MD;  Location: [facility] END[facility]COPY;  Service: Gastroenterology;  Laterality: N/A;  . ESOPHAGOGASTRODUODENOSCOPY (EGD) WITH PROPOFOL N/A 10/25/2017   Procedure: ESOPHAGOGASTRODUODENOSCOPY (EGD) WITH PROPOFOL;  Surgeon: Toledo, Boykin Nearing, MD;  Location: [facility];  Service: Gastroenterology;  Laterality: N/A;  . WISDOM TOOTH EXTRACTION    . WRIST SURGERY      There were no vitals filed for this visit.  Subjective Assessment - 01/08/18 1357    Currently in Pain?  No/denies        S: "I need to improve my leisure, it has been difficult with being a mom of a 66 year old"   O: Education and focus of session on accountability with goals/values setting. Definition and explanation given about accountability. Education further given on self-accountability being in line with personal values and goals to maintain occupational balance in various community settings. Pt given  goal identifying worksheet to list immediate, short term, medium term, and long-term goals using a SMART goal framework (specificity, meaningful, adaptive, realistic, and time bound). Goals created as guideline for pt to practice being accountable in various situations. Pt completed work sheet of goals and encouraged to share goals with the group, with emphasis on immediate goal for check in with pt for next session to maintain accountability.   A: Pt presents to group with blunted affect, engaged and participatory throughout entirety of session. Pt engaged in verbal discussion of definitions of self-accountability with personal examples. Pt completed goals work sheet using SMART goal framework, while remaining in line with values for promotion of occupational balance to help foster continuous practice of accountability skills. Pt identified that she plans to improve her leisure and relationships through a new video game and game she shares with her husband to improve their quality time spent together.  P: Pt provided with education on improving accountability. OT will continue to follow up with accountability skills for successful implementation into daily life.                  OT Education - 01/08/18 1357    Education Details  education given on goals/value setting    Person(s) Educated  Patient    Methods  Explanation;Handout    Comprehension  Verbalized understanding       OT Short Term Goals - 12/19/17 1400  OT SHORT TERM GOAL #1   Title  Pt will be educated on strategies to improve psychosocial skills needed to participate fully in all daily, work, and leisure activities    Time  4    Period  Weeks    Status  On-going    Target Date  01/15/18      OT SHORT TERM GOAL #2   Title  Pt will apply psychosocial skills and coping mechansims to daily activities in order to function independently and reintegrate into community dwelling    Time  4    Period  Weeks    Status   On-going      OT SHORT TERM GOAL #3   Title  Pt will recall and apply 1-3 sleep hygiene strategies to improve engagement in BADL routine    Time  4    Period  Weeks    Status  On-going      OT SHORT TERM GOAL #4   Title  Pt will recall and implement 1-3 time management strategies to improve function in IADL routine    Time  4    Period  Weeks    Status  On-going      OT SHORT TERM GOAL #5   Title  Pt will recall and apply 3-5 stress management strategies for application into community reintegration    Time  4    Period  Weeks    Status  On-going               Plan - 01/08/18 1358    Occupational performance deficits (Please refer to evaluation for details):  ADL's;IADL's;Rest and Sleep;Education;Work;Leisure;Social Participation       Patient will benefit from skilled therapeutic intervention in order to improve the following deficits and impairments:  Decreased coping skills, Decreased psychosocial skills, Other (comment)(decreased ability to engage in BADL and reintegrate into community)  Visit Diagnosis: Organic personality disorder  Difficulty coping    Problem List Patient Active Problem List   Diagnosis Date Noted  . Indication for care in labor and delivery, delivered 12/26/2014  . Indication for care or intervention related to labor and delivery 12/23/2014  . Labor and delivery, indication for care 10/13/2014   [name], MSOT, OTR/L Behavioral Health OT/ Acute Relief OT PHP Office: [phone]  [name] 01/08/2018, 1:59 PM  [facility] PARTIAL HOSPITALIZATION PROGRAM [address] [place], [place], [address] Phone: [phone]   Fax:  [phone]  Name: Sonya [name] MRN: [id number] Date of Birth: 09-13-1990

## 2018-01-09 ENCOUNTER — Other Ambulatory Visit (HOSPITAL_COMMUNITY): Payer: No Typology Code available for payment source | Admitting: Occupational Therapy

## 2018-01-09 ENCOUNTER — Encounter (HOSPITAL_COMMUNITY): Payer: Self-pay | Admitting: Occupational Therapy

## 2018-01-09 ENCOUNTER — Encounter (HOSPITAL_COMMUNITY): Payer: Self-pay | Admitting: Family

## 2018-01-09 ENCOUNTER — Other Ambulatory Visit (HOSPITAL_COMMUNITY): Payer: No Typology Code available for payment source | Admitting: Licensed Clinical Social Worker

## 2018-01-09 DIAGNOSIS — F411 Generalized anxiety disorder: Secondary | ICD-10-CM

## 2018-01-09 DIAGNOSIS — F331 Major depressive disorder, recurrent, moderate: Secondary | ICD-10-CM

## 2018-01-09 DIAGNOSIS — F070 Personality change due to known physiological condition: Secondary | ICD-10-CM

## 2018-01-09 DIAGNOSIS — R4589 Other symptoms and signs involving emotional state: Secondary | ICD-10-CM

## 2018-01-09 NOTE — Progress Notes (Signed)
  [facility] Behavioral Health Partial Hosptilazation Outpatient Program Discharge Summary  Sonya [name] [id number]  Admission date: 12/18/2017 Discharge date: 01/09/2018  Reason for admission: Depression and mood instability   Per [facility] assessment note: Patient presents with appropriate affect, depressed mood and admits to periodic thoughts of suicide over the past few weeks.  States she lost her job with [employer] one week prior and has a long history of failures with antidepressant medications.  Patient denies any current suicidal or homicidal ideations, no auditory or visual hallucinations and no plan or intent to harm self or others at this time.  Patient reported SSRI and SNRI antidepressants have only made her "not feel" in the past and particularly stated this about Prozac and Lexapro.  Patient reported she is currently not taking any medications for depression and reports "my mood has been all over the place".  Patient questions if she may be more Bipolar with symptoms and admits to some increased promiscuity over the past few months. States her husband and her are not together but still living together and have been together for more than 10 years.  Chemical Use History:  Was denied   Family of Origin Issues: Patient reports her relationship between she and her husband has been improving. Reports she is taking medication as prescribed during partial hospitalization program.   Progress in Program Toward Treatment Goals: Ongoing, Patient is stepping down to IOP  Progress (rationale): transition to  Intensive outpatient programming (IOP) 01/10/2018   Patient was initiated on Zoloft 50 mg QD  and Tegretol 100 mg po BID      Take all medications as prescribed. Keep all follow-up appointments as scheduled.  Do not consume alcohol or use illegal drugs while on prescription medications. Report any adverse effects from your medications to your primary care provider promptly.  In the event of  recurrent symptoms or worsening symptoms, call 911, a crisis hotline, or go to the nearest emergency department for evaluation.   Oneta Rack, NP 01/09/2018

## 2018-01-09 NOTE — Therapy (Signed)
[facility] [place], [place], [address] Phone: [phone]   Fax:  [phone]  Occupational Therapy Treatment  Patient Details  Name: Sonya [name] MRN: [id number] Date of Birth: 23-May-1990 Referring Provider (OT): Ricky Ala, NP   Encounter Date: 01/09/2018  OT End of Session - 01/09/18 1230    Visit Number  10    Number of Visits  16    Date for OT Re-Evaluation  01/15/18    Authorization Type  [employer], [employer] employee    OT Start Time  1100    OT Stop Time  1200    OT Time Calculation (min)  60 min    Activity Tolerance  Patient tolerated treatment well    Behavior During Therapy  [facility] for tasks assessed/performed       Past Medical History:  Diagnosis Date  . Anxiety   . Depression   . GERD (gastroesophageal reflux disease) 10/2017  . Meniere disease UNKNOWN  . Vertigo     Past Surgical History:  Procedure Laterality Date  . COLONOSCOPY WITH PROPOFOL N/A 10/25/2017   Procedure: COLONOSCOPY WITH PROPOFOL;  Surgeon: Toledo, Benay Pike, MD;  Location: [facility] ENDOSCOPY;  Service: Gastroenterology;  Laterality: N/A;  . ESOPHAGOGASTRODUODENOSCOPY (EGD) WITH PROPOFOL N/A 10/25/2017   Procedure: ESOPHAGOGASTRODUODENOSCOPY (EGD) WITH PROPOFOL;  Surgeon: Toledo, Benay Pike, MD;  Location: [facility] ENDOSCOPY;  Service: Gastroenterology;  Laterality: N/A;  . WISDOM TOOTH EXTRACTION    . WRIST SURGERY      There were no vitals filed for this visit.  Subjective Assessment - 01/09/18 1230    Currently in Pain?  No/denies        S: "I am 0/10 assertive"  O: Education given on assertiveness as it applies to conversation and context when reintegrating into the community. Education packet given on passive, aggressive, passive aggressive, and assertiveness communication. Additional education given on strategies to say "no". Pt encouraged to choose assertiveness mentor" that they can model in daily life. Role playing  situations given for entire group to practice with partners with situations of health care providers, friends, work, and community members. Discussion facilitated after each role play.  A: Pt presents to group with blunted affect, engaged and participatory throughout session. Pt identifying that she is more passive and passive aggressive than she is assertive at baseline. Pt chose previous boss to help model assertive behavior in the future. Pt engaged in role play needing min VC's to shape conversation well and assertively.   P: Education given on assertiveness, OT will follow up in next treatment session to ensure implementation to daily life.                   OT Education - 01/09/18 1230    Education Details  education given on assertiveness communication strategies    Person(s) Educated  Patient    Methods  Explanation;Handout    Comprehension  Verbalized understanding       OT Short Term Goals - 01/09/18 1231      OT SHORT TERM GOAL #1   Title  Pt will be educated on strategies to improve psychosocial skills needed to participate fully in all daily, work, and leisure activities    Time  4    Period  Weeks    Status  Achieved    Target Date  01/15/18      OT SHORT TERM GOAL #2   Title  Pt will apply psychosocial skills and coping  mechansims to daily activities in order to function independently and reintegrate into community dwelling    Time  4    Period  Weeks    Status  Achieved      OT SHORT TERM GOAL #3   Title  Pt will recall and apply 1-3 sleep hygiene strategies to improve engagement in BADL routine    Time  4    Period  Weeks    Status  Achieved      OT SHORT TERM GOAL #4   Title  Pt will recall and implement 1-3 time management strategies to improve function in IADL routine    Time  4    Period  Weeks    Status  Achieved      OT SHORT TERM GOAL #5   Title  Pt will recall and apply 3-5 stress management strategies for application into community  reintegration    Time  4    Period  Weeks    Status  Achieved               Plan - 01/09/18 1231    Occupational performance deficits (Please refer to evaluation for details):  ADL's;IADL's;Rest and Sleep;Education;Work;Leisure;Social Participation       Patient will benefit from skilled therapeutic intervention in order to improve the following deficits and impairments:  Decreased coping skills, Decreased psychosocial skills, Other (comment)(decreased ability to engage in BADL and reintegrate into community)  Visit Diagnosis: Organic personality disorder  Difficulty coping    Problem List Patient Active Problem List   Diagnosis Date Noted  . Indication for care in labor and delivery, delivered 12/26/2014  . Indication for care or intervention related to labor and delivery 12/23/2014  . Labor and delivery, indication for care 10/13/2014   OCCUPATIONAL THERAPY DISCHARGE SUMMARY  Visits from Start of Care: 10  Current functional level related to goals / functional outcomes: Pt is stepping down to IOP level of care   Remaining deficits: Continuing to apply coping mechanisms and psychosocial skills learned in group.   Education / Equipment: Education given on psychosocial skills and coping mechanisms as it applies to BADL/IADL routine and functional community reintegration. Plan: Patient agrees to discharge.  Patient goals were met. Patient is being discharged due to meeting the stated rehab goals.  ?????         , MSOT, OTR/L Behavioral Health OT/ Acute Relief OT PHP Office: [phone]   01/09/2018, 12:33 PM   BEHAVIORAL HEALTH PARTIAL HOSPITALIZATION PROGRAM [address] [place], [place], [address] Phone: [phone]   Fax:  [phone]  Name: Sonya [name] MRN: [id number] Date of Birth: 12/18/1990 

## 2018-01-10 ENCOUNTER — Other Ambulatory Visit (HOSPITAL_COMMUNITY): Payer: Self-pay

## 2018-01-10 NOTE — Psych (Signed)
[facility] BH PHP THERAPIST PROGRESS NOTE  Sonya [name] [id number]  Session Time: 9:00 -11:00  Participation Level: Active  Behavioral Response: CasualAlertDepressed  Type of Therapy: Group Therapy  Treatment Goals addressed: Coping  Interventions: CBT, DBT, Solution Focused, Supportive and Reframing  Summary:  Clinician led check-in regarding current stressors and situation, and review of patient completed daily inventory. Clinician utilized active listening and empathetic response and validated patient emotions. Clinician facilitated processing group on pertinent issues.  Therapist Response: Sonya [name] is a 27 y.o. female who presents with depression and anxiety symptoms. Patient arrived within time allowed and reports that she is feeling anxious. Pt. states that her anxiety is related to her meeting with her boss this afternoon. Patient rates her mood at a 6 on a scale of 1-10 with 10 being great. Pt reports that she went to a trunk or treat with her daughter and husband last night. Pt. states that she has been ruminating about the outcome of the meeting with her boss today and is worried. Patient continues to struggle with utilizing skills for anxiety. Patient engaged in discussion.       Session Time: 11:00 -12:00   Participation Level: Active   Behavioral Response: CasualAlertDepressed   Type of Therapy: Group Therapy, OT   Treatment Goals addressed: Coping   Interventions: Psychosocial skills training, Supportive,    Summary:  Occupational Therapy group   Therapist Response: Patient engaged in group. See OT note.                 Session Time: 12:00 - 12:45   Participation Level: Active   Behavioral Response: CasualAlertDepressed   Type of Therapy: Group Therapy   Treatment Goals addressed: Coping   Interventions: Psychologist, occupational, Supportive   Summary:  Reflection Group: Patients encouraged to practice skills and interpersonal techniques or  work on mindfulness and relaxation techniques. The importance of self-care and making skills part of a routine to increase usage were stressed    Therapist Response: Patient engaged and participated appropriately.              Session Time: 12:45- 2:00  Participation Level: Active  Behavioral Response: CasualAlertDepressed  Type of Therapy: Group Therapy, Psychoeducation; Psychotherapy  Treatment Goals addressed: Coping  Interventions: CBT; Solution focused; Supportive; Reframing  Summary: 12:45 - 1:50 Cln led art therapy group. Group used creativity to depict how their mental health feels on bad days and then how they want to feel in the future. Pt's shared their artwork.  1:50 -2:00 Clinician led check-out. Clinician assessed for immediate needs, medication compliance and efficacy, and safety concerns    Therapist Response: Patient engaged in activity. Pt depicted bad days as being a wilted flower that does not get any rain during a rainstorm and is struck by lightning envious of other flowers under the sun. Pt depicted good days as being a large sunflower that is happy with herself and is not envious of the other flowers in the field.  At check-out, patient rates her mood at a 6 on a scale of 1-10 with 10 being great. Patient reports afternoon plans of meeting with boss. Patient demonstrates some progress recognizing envy of others is connected to her depression. Patient denies SI/HI/self-harm thoughts at the end of group.       Suicidal/Homicidal: No without intent/plan   Plan: Pt will continue in PHP while working to decrease depression and anxiety symptoms, increase ability to use skills to manage the symptoms, and eliminate SI.  Diagnosis: Severe episode of recurrent major depressive disorder, without psychotic features (HCC) [F33.2]    1. Severe episode of recurrent major depressive disorder, without psychotic features (HCC)     [name], LCSW,  LCAS 01/10/2018

## 2018-01-11 ENCOUNTER — Other Ambulatory Visit (HOSPITAL_COMMUNITY): Payer: Self-pay

## 2018-01-12 ENCOUNTER — Other Ambulatory Visit (HOSPITAL_COMMUNITY): Payer: Self-pay

## 2018-01-15 ENCOUNTER — Other Ambulatory Visit (HOSPITAL_COMMUNITY): Payer: Self-pay

## 2018-01-16 ENCOUNTER — Other Ambulatory Visit (HOSPITAL_COMMUNITY): Payer: Self-pay

## 2018-01-16 NOTE — Psych (Signed)
[facility] BH PHP THERAPIST PROGRESS NOTE  Sonya [name] [id number]  Session Time: 9:00 -11:00  Participation Level: Active  Behavioral Response: CasualAlertDepressed  Type of Therapy: Group Therapy  Treatment Goals addressed: Coping  Interventions: CBT, DBT, Solution Focused, Supportive and Reframing  Summary:  Clinician led check-in regarding current stressors and situation, and review of patient completed daily inventory. Clinician utilized active listening and empathetic response and validated patient emotions. Clinician facilitated processing group on pertinent issues.  Therapist Response: Sonya [name] is a 27 y.o. female who presents with depression and anxiety symptoms. Patient arrived within time allowed and reports that she is feeling "fine." Patient rates her mood at a 5 on a scale of 1-10 with 10 being great. Pt shares that she was fired last Thursday due to her ADA claim being declined. Pt states she is disappointed however processed through most feelings last time, so it is not hitting as hard. Pt reports desire to pursue disability. Pt states overall having a positive weekend and making it a priority to take time alone. Patient continues to struggle with considering long term outcomes. Patient engaged in discussion.       Session Time: 11:00 -12:00   Participation Level: Active   Behavioral Response: CasualAlertDepressed   Type of Therapy: Group Therapy, OT   Treatment Goals addressed: Coping   Interventions: Psychosocial skills training, Supportive,    Summary:  Occupational Therapy group   Therapist Response: Patient engaged in group. See OT note.                 Session Time: 12:00 - 12:45   Participation Level: Active   Behavioral Response: CasualAlertDepressed   Type of Therapy: Group Therapy   Treatment Goals addressed: Coping   Interventions: Psychologist, occupational, Supportive   Summary:  Reflection Group: Patients encouraged to practice  skills and interpersonal techniques or work on mindfulness and relaxation techniques. The importance of self-care and making skills part of a routine to increase usage were stressed    Therapist Response: Patient engaged and participated appropriately.              Session Time: 12:45- 2:00  Participation Level: Active  Behavioral Response: CasualAlertDepressed  Type of Therapy: Group Therapy, Psychoeducation; Psychotherapy  Treatment Goals addressed: Coping  Interventions: CBT; Solution focused; Supportive; Reframing  Summary: 12:45 - 1:50 Clinician introduced topic of feelings and emotions. Clinician discussed the way to contextualize feelings as things that come and go and the ability to choose which feelings we attach to. Cln used metaphor of leaves in the wind. Group viewed TED talk "The power of vulnerability" and discussed how vulnerability and other difficult feelings can be thought of differently.   1:50 -2:00 Clinician led check-out. Clinician assessed for immediate needs, medication compliance and efficacy, and safety concerns    Therapist Response: Pt reports understanding of feelings and topics discussed. Pt is able to recognize ways in which this framework can affect how they view feelings.                                                                                       At check-out, patient rates  her mood at a 5.5 on a scale of 1-10 with 10 being great. Patient reports afternoon plans of running errands and playing video games. Patient demonstrates some progress as evidenced by increase in ability to complete ADL's. Patient denies SI/HI/self-harm thoughts at the end of group.      Suicidal/Homicidal: No without intent/plan   Plan: Pt will continue in PHP while working to decrease depression and anxiety symptoms, increase ability to use skills to manage the symptoms, and eliminate SI.   Diagnosis: Severe episode of recurrent major depressive disorder,  without psychotic features (HCC) [F33.2]    1. Severe episode of recurrent major depressive disorder, without psychotic features (HCC)   2. GAD (generalized anxiety disorder)     Donia GuilesJenny Sullivan Jacuinde, LCSW, LCAS 01/16/2018

## 2018-01-16 NOTE — Psych (Signed)
[facility] BH PHP THERAPIST PROGRESS NOTE  Sonya [name] [id number]  Session Time: 9:00 -11:00  Participation Level: Active  Behavioral Response: CasualAlertDepressed  Type of Therapy: Group Therapy  Treatment Goals addressed: Coping  Interventions: CBT, DBT, Solution Focused, Supportive and Reframing  Summary:  Clinician led check-in regarding current stressors and situation, and review of patient completed daily inventory. Clinician utilized active listening and empathetic response and validated patient emotions. Clinician facilitated processing group on pertinent issues.  Therapist Response: Sonya [name] is a 27 y.o. female who presents with depression and anxiety symptoms. Patient arrived within time allowed and reports that she is feeling "kind of low." Patient rates her mood at a 4 on a scale of 1-10 with 10 being great. Pt reports she is feeling anxious about it being her last day and feels that is where the low mood comes from. pt states she spent the afternoon yesterday running errands and crashed when she finally got home. Pt reports she feels the relationship with her husband has improved. Patient engaged in discussion.       Session Time: 11:00 -12:00   Participation Level: Active   Behavioral Response: CasualAlertDepressed   Type of Therapy: Group Therapy, OT   Treatment Goals addressed: Coping   Interventions: Psychosocial skills training, Supportive,    Summary:  Occupational Therapy group   Therapist Response: Patient engaged in group. See OT note.                 Session Time: 12:00 - 12:45   Participation Level: Active   Behavioral Response: CasualAlertDepressed   Type of Therapy: Group Therapy   Treatment Goals addressed: Coping   Interventions: Psychologist, occupational, Supportive   Summary:  Reflection Group: Patients encouraged to practice skills and interpersonal techniques or work on mindfulness and relaxation techniques. The importance  of self-care and making skills part of a routine to increase usage were stressed    Therapist Response: Patient engaged and participated appropriately.              Session Time: 12:45- 2:00  Participation Level: Active  Behavioral Response: CasualAlertDepressed  Type of Therapy: Group Therapy, Psychoeducation; Psychotherapy  Treatment Goals addressed: Coping  Interventions: CBT; Solution focused; Supportive; Reframing  Summary: 12:45 - 1:50 Clinician continued topic of feelings. Clinician led review of topic. Group played feelings Jenga and group members worked to access specificity with feeling recognition.      1:50 -2:00 Clinician led check-out. Clinician assessed for immediate needs, medication compliance and efficacy, and safety concerns    Therapist Response: Pt participated in activity. Pt is able to recognize and define a spectrum of feeling words and how she has experienced them.         At check-out, patient rates her mood at a 6 on a scale of 1-10 with 10 being great. Patient reports afternoon plans of playing video games and resting. Patient demonstrates some progress as evidenced by brighter affect and outlook. Patient denies SI/HI/self-harm thoughts at the end of group.      Suicidal/Homicidal: No without intent/plan   Plan: Pt will discharge from PHP due to meeting treatment goals of decreased depression symptoms, increased ability to utilize skills to manage symptoms, increased prosocial activities, and eliminated SI. Pt's progress noted by self report, observation, and scales. Pt and provider are aligned with discharge. Pt will step down to IOP within this agency and has start date of 01/10/18. Pt denies SI/HI at time of discharge.    Diagnosis:  MDD (major depressive disorder), recurrent episode, moderate (HCC) [F33.1]    1. MDD (major depressive disorder), recurrent episode, moderate (HCC)   2. GAD (generalized anxiety disorder)     Donia GuilesJenny  Sonya Spargur, LCSW, LCAS 01/16/2018

## 2018-01-17 ENCOUNTER — Other Ambulatory Visit (HOSPITAL_COMMUNITY): Payer: Self-pay

## 2018-01-17 ENCOUNTER — Telehealth (HOSPITAL_COMMUNITY): Payer: Self-pay | Admitting: Professional

## 2018-01-17 NOTE — Telephone Encounter (Signed)
Cln returned pt's call. Cln told pt we would not extend STD because we only write out while pt is in group. Cln recommended pt to go to [facility] or [facility]. Pt reports she want to [facility] and they will not see her b/c she still has ins until 11/30 and they do not do STD pw. Pt states STD would need to be extended by 11/22. Cln told pt she would be unable to get an appt in this office with a psychiatrist before 11/22. Cln recommended pt call psychiatrists in the area to see who would be able to get her in that quickly. Cln recommended [facility]. Pt agreed.

## 2018-01-18 ENCOUNTER — Other Ambulatory Visit (HOSPITAL_COMMUNITY): Payer: Self-pay

## 2018-01-19 ENCOUNTER — Other Ambulatory Visit (HOSPITAL_COMMUNITY): Payer: Self-pay

## 2018-04-12 ENCOUNTER — Emergency Department: Payer: BLUE CROSS/BLUE SHIELD

## 2018-04-12 ENCOUNTER — Emergency Department
Admission: EM | Admit: 2018-04-12 | Discharge: 2018-04-12 | Disposition: A | Discharge status: Home / Self Care | Payer: BLUE CROSS/BLUE SHIELD | Attending: Emergency Medicine | Admitting: Emergency Medicine

## 2018-04-12 ENCOUNTER — Encounter: Payer: Self-pay | Admitting: Emergency Medicine

## 2018-04-12 DIAGNOSIS — R079 Chest pain, unspecified: Secondary | ICD-10-CM | POA: Diagnosis present

## 2018-04-12 DIAGNOSIS — F17210 Nicotine dependence, cigarettes, uncomplicated: Secondary | ICD-10-CM | POA: Diagnosis not present

## 2018-04-12 DIAGNOSIS — K219 Gastro-esophageal reflux disease without esophagitis: Secondary | ICD-10-CM | POA: Diagnosis not present

## 2018-04-12 DIAGNOSIS — R002 Palpitations: Secondary | ICD-10-CM | POA: Insufficient documentation

## 2018-04-12 DIAGNOSIS — F419 Anxiety disorder, unspecified: Secondary | ICD-10-CM | POA: Insufficient documentation

## 2018-04-12 LAB — URINALYSIS, COMPLETE (UACMP) WITH MICROSCOPIC
Bilirubin Urine: NEGATIVE
GLUCOSE, UA: NEGATIVE mg/dL
Hgb urine dipstick: NEGATIVE
Ketones, ur: NEGATIVE mg/dL
Leukocytes, UA: NEGATIVE
Nitrite: NEGATIVE
Protein, ur: NEGATIVE mg/dL
Specific Gravity, Urine: 1.015 (ref 1.005–1.030)
pH: 6 (ref 5.0–8.0)

## 2018-04-12 LAB — BASIC METABOLIC PANEL
ANION GAP: 9 (ref 5–15)
BUN: 10 mg/dL (ref 6–20)
CO2: 20 mmol/L — ABNORMAL LOW (ref 22–32)
Calcium: 9.3 mg/dL (ref 8.9–10.3)
Chloride: 106 mmol/L (ref 98–111)
Creatinine, Ser: 0.55 mg/dL (ref 0.44–1.00)
GFR calc Af Amer: >60 mL/min (ref >60)
GFR calc non Af Amer: >60 mL/min (ref >60)
Glucose, Bld: 127 mg/dL — ABNORMAL HIGH (ref 70–99)
Potassium: 3.4 mmol/L — ABNORMAL LOW (ref 3.5–5.1)
SODIUM: 135 mmol/L (ref 135–145)

## 2018-04-12 LAB — POCT PREGNANCY, URINE: Preg Test, Ur: NEGATIVE

## 2018-04-12 LAB — TROPONIN I
Troponin I: <0.03 ng/mL (ref <0.03)
Troponin I: <0.03 ng/mL (ref <0.03)

## 2018-04-12 LAB — CBC
HCT: 38.7 % (ref 36.0–46.0)
Hemoglobin: 12.6 g/dL (ref 12.0–15.0)
MCH: 27.9 pg (ref 26.0–34.0)
MCHC: 32.6 g/dL (ref 30.0–36.0)
MCV: 85.6 fL (ref 80.0–100.0)
Platelets: 268 10*3/uL (ref 150–400)
RBC: 4.52 MIL/uL (ref 3.87–5.11)
RDW: 13.3 % (ref 11.5–15.5)
WBC: 12.7 10*3/uL — ABNORMAL HIGH (ref 4.0–10.5)
nRBC: 0 % (ref 0.0–0.2)

## 2018-04-12 LAB — FIBRIN DERIVATIVES D-DIMER (ARMC ONLY): Fibrin derivatives D-dimer (ARMC): 934.68 ng/mL (FEU) — ABNORMAL HIGH (ref 0.00–499.00)

## 2018-04-12 MED ORDER — IOHEXOL 350 MG/ML SOLN
75.0000 mL | Freq: Once | INTRAVENOUS | Status: AC | PRN
  Administered 2018-04-12: 75 mL via INTRAVENOUS

## 2018-04-12 MED ORDER — SODIUM CHLORIDE 0.9 % IV BOLUS
1000.0000 mL | Freq: Once | INTRAVENOUS | Status: AC
  Administered 2018-04-12: 1000 mL via INTRAVENOUS

## 2018-04-12 NOTE — ED Triage Notes (Signed)
Pt called for EKG. Pt in BR and will let RN know when she is out.

## 2018-04-12 NOTE — ED Notes (Signed)
X-ray at bedside

## 2018-04-12 NOTE — ED Triage Notes (Signed)
Patient presents to the ED with near syncopal episode.  Feeling dizzy and having pain between shoulder blades and sharp chest pain.  Patient states, "I checked my heart rate and it was 140 so I thought I should come get checked out."  Patient is in no obvious distress at this time.  Ambulatory to triage.

## 2018-04-12 NOTE — ED Notes (Signed)
Patient c/o sharp pains on Right side of neck and down leg

## 2018-04-12 NOTE — ED Provider Notes (Addendum)
[facility] Emergency Department Provider Note  Time seen: 4:35 PM  I have reviewed the triage vital signs and the nursing notes.   HISTORY  Chief Complaint Palpitations and Near Syncope    HPI Sonya [name] is a 28 y.o. female with a past medical history of anxiety, depression, gastric reflux, presents to the emergency department for palpitations and intermittent chest discomfort.  According to the patient since early this morning she has been feeling palpitations, states she checked her heart rate and it was 147 bpm this morning.  She called her doctor and they referred her to the emergency department for evaluation.  Patient states she is noticed a sharp pain in between her shoulder blades that is worse if she takes a deep breath.  Denies any leg swelling.  Patient is on birth control.   Past Medical History:  Diagnosis Date  . Anxiety   . Depression   . GERD (gastroesophageal reflux disease) 10/2017  . Meniere disease UNKNOWN  . Vertigo     Patient Active Problem List   Diagnosis Date Noted  . Indication for care in labor and delivery, delivered 12/26/2014  . Indication for care or intervention related to labor and delivery 12/23/2014  . Labor and delivery, indication for care 10/13/2014    Past Surgical History:  Procedure Laterality Date  . COLONOSCOPY WITH PROPOFOL N/A 10/25/2017   Procedure: COLONOSCOPY WITH PROPOFOL;  Surgeon: Toledo, Boykin Nearingeodoro K, MD;  Location: [facility] ENDOSCOPY;  Service: Gastroenterology;  Laterality: N/A;  . ESOPHAGOGASTRODUODENOSCOPY (EGD) WITH PROPOFOL N/A 10/25/2017   Procedure: ESOPHAGOGASTRODUODENOSCOPY (EGD) WITH PROPOFOL;  Surgeon: Toledo, Boykin Nearingeodoro K, MD;  Location: [facility] ENDOSCOPY;  Service: Gastroenterology;  Laterality: N/A;  . WISDOM TOOTH EXTRACTION    . WRIST SURGERY      Prior to Admission medications   Medication Sig Start Date End Date Taking? Authorizing Provider  Aspirin-Acetaminophen-Caffeine (GOODY HEADACHE  PO) Take 1 packet by mouth once as needed (for headaches).    [provider]  carbamazepine (TEGRETOL-XR) 100 MG 12 hr tablet Take 1 tablet (100 mg total) by mouth 2 (two) times daily. 12/27/17 12/27/18  Oneta RackLewis, Tanika N, NP  Dexlansoprazole (DEXILANT) 30 MG capsule Take 30 mg by mouth daily.    [provider]  Ferrous Sulfate (IRON) 28 MG TABS Take 28 mg by mouth daily.    [provider]  fluticasone (FLONASE) 50 MCG/ACT nasal spray Place 1 spray into both nostrils daily.  09/08/17   [provider]  Homeopathic Products (AVENOC EX) Apply topically.    [provider]  ibuprofen (ADVIL,MOTRIN) 800 MG tablet Take 800 mg by mouth every 6 (six) hours as needed (for pain or migraines).    [provider]  Lactobacillus Rhamnosus, GG, ([employer] PROBIOTIC, LACTOBACILLUS,) CAPS Take 1 capsule by mouth daily.    [provider]  meclizine (ANTIVERT) 25 MG tablet Take 25 mg by mouth 3 (three) times daily as needed (for vertigo).  09/08/17   [provider]  Multiple Vitamins-Minerals (ONE-A-DAY WOMENS VITACRAVES) CHEW Chew 2 tablets by mouth daily.    [provider]  nicotine (NICODERM CQ) 14 mg/24hr patch Place 1 patch (14 mg total) onto the skin daily. 01/03/18   Oneta RackLewis, Tanika N, NP  nitrofurantoin, macrocrystal-monohydrate, (MACROBID) 100 MG capsule Take 1 capsule (100 mg total) by mouth 2 (two) times daily. 1 po BId Patient not taking: Reported on 12/27/2017 12/16/17   Bennie PieriniMartin, Mary-Margaret, FNP  ondansetron (ZOFRAN ODT) 4 MG disintegrating tablet Take  1 tablet (4 mg total) by mouth every 6 (six) hours as needed for nausea or vomiting. 10/07/17   Evon Slack, PA-C  pantoprazole (PROTONIX) 40 MG tablet Take 40 mg by mouth daily before breakfast.     [provider]  promethazine (PHENERGAN) 25 MG tablet Take 1 tablet (25 mg total) by mouth every 6 (six) hours as needed for nausea or vomiting. Patient not taking:  Reported on 12/18/2017 10/08/17   Irean Hong, MD  propranolol (INDERAL) 10 MG tablet Take 1 tablet (10 mg total) by mouth 2 (two) times daily as needed. For severe anxiety sx Patient not taking: Reported on 12/18/2017 08/17/17   Jomarie Longs, MD  sertraline (ZOLOFT) 50 MG tablet Take 1 tablet (50 mg total) by mouth daily. 12/27/17   Oneta Rack, NP  triamterene-hydrochlorothiazide (DYAZIDE) 37.5-25 MG capsule Take 1 capsule by mouth daily. 01/18/17   [provider]  VENTOLIN HFA 108 (90 Base) MCG/ACT inhaler Inhale 2 puffs into the lungs every 6 (six) hours as needed for wheezing or shortness of breath.  09/15/17   [provider]    Allergies  Allergen Reactions  . Adipex-P [Phentermine] Shortness Of Breath, Swelling, Palpitations and Hypertension    Tunnel vision, chest pain, headache, and feet became swollen  . Codeine Nausea And Vomiting  . Adhesive [Tape] Rash    Patient prefers paper tape    Family History  Problem Relation Age of Onset  . Graves' disease Mother   . Physical abuse Mother   . Drug abuse Father   . Alcohol abuse Father   . Seizures Father   . Drug abuse Sister   . Anxiety disorder Sister   . Depression Brother   . Anxiety disorder Brother   . Bipolar disorder Brother     Social History Social History   Tobacco Use  . Smoking status: Heavy Tobacco Smoker    Packs/day: 0.50    Types: Cigarettes  . Smokeless tobacco: Never Used  . Tobacco comment: Recently started and reports helps with anxiety.   Substance Use Topics  . Alcohol use: Not Currently    Comment: Occasional drinks with dinner twice a month   . Drug use: No    Review of Systems Constitutional: Negative for fever. Cardiovascular: Mild sharp posterior chest pain.  No anterior chest pain. Respiratory: Patient states minimal intermittent shortness of breath, none currently. Gastrointestinal: Negative for abdominal pain, vomiting  Musculoskeletal: Negative for leg  swelling. Skin: Negative for skin complaints  Neurological: Negative for headache All other ROS negative  ____________________________________________   PHYSICAL EXAM:  VITAL SIGNS: ED Triage Vitals  Enc Vitals Group     BP 04/12/18 1140 123/85     Pulse Rate 04/12/18 1140 (!) 117     Resp 04/12/18 1140 20     Temp 04/12/18 1140 98.4 F (36.9 C)     Temp Source 04/12/18 1140 Oral     SpO2 04/12/18 1140 98 %     Weight 04/12/18 1141 208 lb (94.3 kg)     Height 04/12/18 1141 5\' 3"  (1.6 m)     Head Circumference --      Peak Flow --      Pain Score 04/12/18 1148 8     Pain Loc --      Pain Edu? --      Excl. in GC? --     Constitutional: Alert and oriented. Well appearing and in no distress. Eyes: Normal exam ENT  Head: Normocephalic and atraumatic.   Mouth/Throat: Mucous membranes are moist. Cardiovascular: Regular rhythm, rate around 120 bpm.  No obvious murmur rub or gallop. Respiratory: Normal respiratory effort without tachypnea nor retractions. Breath sounds are clear  Gastrointestinal: Soft and nontender. No distention.  Musculoskeletal: Nontender with normal range of motion in all extremities. No lower extremity tenderness or edema. Neurologic:  Normal speech and language. No gross focal neurologic deficits Skin:  Skin is warm, dry and intact.  Psychiatric: Mood and affect are normal.   ____________________________________________    EKG  EKG viewed and interpreted by myself shows sinus tachycardia 117 bpm with a narrow QRS, normal axis, largely normal intervals nonspecific ST changes.  There is some mild inferior ST changes which appear to be changed from prior EKG.  We will repeat EKG.  Repeat EKG viewed and interpreted by myself shows a sinus tachycardia 114 bpm with a narrow QRS, normal axis, normal intervals, nonspecific ST changes.  ____________________________________________    RADIOLOGY  CT angiography the chest is  negative  ____________________________________________   INITIAL IMPRESSION / ASSESSMENT AND PLAN / ED COURSE  Pertinent labs & imaging results that were available during my care of the patient were reviewed by me and considered in my medical decision making (see chart for details).  Patient presents to the emergency department for mild sharp posterior chest pain as well as heart rate as high as 147 bpm.  Patient does state the chest pain is somewhat worse if she takes a deep inspiration.  No leg pain or swelling at this time.  Patient's initial blood work is largely within normal limits, I will add on a troponin.  Given the mild EKG changes we will also add on a d-dimer.  Patient does have admitted anxiety which could be contributing to her symptoms as well today.  We will repeat an EKG, add on a troponin, send a repeat troponin as well as d-dimer, IV hydrate and continue to closely monitor.  Patient's repeat troponin is negative.  Repeat EKG although improved continues though very minimal T wave abnormality in the inferior leads.  Repeat troponin was negative.  D-dimer was positive at 900.  We will proceed with CT angiography of the chest.  CT angiography chest has resulted negative for PE.  Patient is feeling better, we will discharge with cardiology follow-up given the slight abnormality on EKG for possible stress or echocardiogram.  Patient agreeable to plan of care.  I also discussed return precautions for any return of/worsening chest pain or shortness of breath.  Patient agreeable.  ____________________________________________   FINAL CLINICAL IMPRESSION(S) / ED DIAGNOSES  Chest pain Palpitations   Minna AntisPaduchowski, Andreia Gandolfi, MD 04/12/18 1924    Minna AntisPaduchowski, Yatzari Jonsson, MD 04/12/18 1925

## 2018-04-12 NOTE — ED Notes (Signed)
Patient asking for phone charger at this time. No phone charger available to give patient

## 2018-04-18 ENCOUNTER — Emergency Department
Admission: EM | Admit: 2018-04-18 | Discharge: 2018-04-18 | Disposition: A | Discharge status: Home / Self Care | Payer: No Typology Code available for payment source

## 2018-05-17 ENCOUNTER — Other Ambulatory Visit: Payer: Self-pay

## 2018-05-17 ENCOUNTER — Encounter: Payer: Self-pay | Admitting: *Deleted

## 2018-05-17 ENCOUNTER — Emergency Department
Admission: EM | Admit: 2018-05-17 | Discharge: 2018-05-17 | Disposition: A | Discharge status: Home / Self Care | Payer: BLUE CROSS/BLUE SHIELD | Attending: Emergency Medicine | Admitting: Emergency Medicine

## 2018-05-17 ENCOUNTER — Emergency Department: Payer: BLUE CROSS/BLUE SHIELD

## 2018-05-17 DIAGNOSIS — R1010 Upper abdominal pain, unspecified: Secondary | ICD-10-CM

## 2018-05-17 DIAGNOSIS — Z79899 Other long term (current) drug therapy: Secondary | ICD-10-CM | POA: Diagnosis not present

## 2018-05-17 DIAGNOSIS — R52 Pain, unspecified: Secondary | ICD-10-CM

## 2018-05-17 DIAGNOSIS — F17210 Nicotine dependence, cigarettes, uncomplicated: Secondary | ICD-10-CM | POA: Insufficient documentation

## 2018-05-17 LAB — COMPREHENSIVE METABOLIC PANEL
ALT: 11 U/L (ref 0–44)
AST: 16 U/L (ref 15–41)
Albumin: 3.9 g/dL (ref 3.5–5.0)
Alkaline Phosphatase: 61 U/L (ref 38–126)
Anion gap: 10 (ref 5–15)
BUN: 10 mg/dL (ref 6–20)
CO2: 22 mmol/L (ref 22–32)
Calcium: 9.1 mg/dL (ref 8.9–10.3)
Chloride: 106 mmol/L (ref 98–111)
Creatinine, Ser: 0.53 mg/dL (ref 0.44–1.00)
GFR calc non Af Amer: >60 mL/min (ref >60)
Glucose, Bld: 92 mg/dL (ref 70–99)
Potassium: 3.6 mmol/L (ref 3.5–5.1)
Sodium: 138 mmol/L (ref 135–145)
Total Bilirubin: 0.3 mg/dL (ref 0.3–1.2)
Total Protein: 7.9 g/dL (ref 6.5–8.1)

## 2018-05-17 LAB — URINALYSIS, COMPLETE (UACMP) WITH MICROSCOPIC
BILIRUBIN URINE: NEGATIVE
Bacteria, UA: NONE SEEN
Glucose, UA: NEGATIVE mg/dL
Hgb urine dipstick: NEGATIVE
Ketones, ur: 5 mg/dL — AB
Nitrite: NEGATIVE
Protein, ur: NEGATIVE mg/dL
Specific Gravity, Urine: 1.026 (ref 1.005–1.030)
pH: 5 (ref 5.0–8.0)

## 2018-05-17 LAB — CBC
HCT: 39.7 % (ref 36.0–46.0)
Hemoglobin: 13 g/dL (ref 12.0–15.0)
MCH: 28 pg (ref 26.0–34.0)
MCHC: 32.7 g/dL (ref 30.0–36.0)
MCV: 85.6 fL (ref 80.0–100.0)
NRBC: 0 % (ref 0.0–0.2)
Platelets: 310 10*3/uL (ref 150–400)
RBC: 4.64 MIL/uL (ref 3.87–5.11)
RDW: 12.9 % (ref 11.5–15.5)
WBC: 16.3 10*3/uL — ABNORMAL HIGH (ref 4.0–10.5)

## 2018-05-17 LAB — POCT PREGNANCY, URINE: Preg Test, Ur: NEGATIVE

## 2018-05-17 LAB — LIPASE, BLOOD: Lipase: 33 U/L (ref 11–51)

## 2018-05-17 MED ORDER — ACETAMINOPHEN 325 MG PO TABS
650.0000 mg | ORAL_TABLET | Freq: Once | ORAL | Status: AC
  Administered 2018-05-17: 650 mg via ORAL
  Filled 2018-05-17: qty 2

## 2018-05-17 MED ORDER — ALUM & MAG HYDROXIDE-SIMETH 200-200-20 MG/5ML PO SUSP
30.0000 mL | Freq: Once | ORAL | Status: AC
  Administered 2018-05-17: 30 mL via ORAL
  Filled 2018-05-17: qty 30

## 2018-05-17 MED ORDER — SODIUM CHLORIDE 0.9% FLUSH: 3.0000 mL | Freq: Once | INTRAVENOUS | Status: DC

## 2018-05-17 MED ORDER — PANTOPRAZOLE SODIUM 40 MG PO TBEC
40.0000 mg | DELAYED_RELEASE_TABLET | Freq: Once | ORAL | Status: AC
  Administered 2018-05-17: 40 mg via ORAL
  Filled 2018-05-17: qty 1

## 2018-05-17 MED ORDER — LIDOCAINE VISCOUS HCL 2 % MT SOLN
15.0000 mL | Freq: Once | OROMUCOSAL | Status: AC
  Administered 2018-05-17: 15 mL via ORAL
  Filled 2018-05-17: qty 15

## 2018-05-17 MED ORDER — PANTOPRAZOLE SODIUM 40 MG PO TBEC: 40.0000 mg | DELAYED_RELEASE_TABLET | Freq: Every day | ORAL | 1 refills | Status: DC

## 2018-05-17 NOTE — ED Provider Notes (Signed)
[facility] Emergency Department Provider Note  Time seen: 7:59 PM  I have reviewed the triage vital signs and the nursing notes.   HISTORY  Chief Complaint Abdominal Pain    HPI Sonya [name] is a 28 y.o. female with a past medical history of anxiety, depression, gastric reflux, Mnire's disease, vertigo, presents to the emergency department for intermittent epigastric pain.  According to the patient since this morning she has been experiencing intermittent sharp pain in the upper abdomen.  States the pain only lasts 1 or 2 seconds and then goes away but when it comes it is moderate to severe somewhat sharp.  Has not noticed an association with food.  Denies any fever.   Past Medical History:  Diagnosis Date  . Anxiety   . Depression   . GERD (gastroesophageal reflux disease) 10/2017  . Meniere disease UNKNOWN  . Vertigo     Patient Active Problem List   Diagnosis Date Noted  . Indication for care in labor and delivery, delivered 12/26/2014  . Indication for care or intervention related to labor and delivery 12/23/2014  . Labor and delivery, indication for care 10/13/2014    Past Surgical History:  Procedure Laterality Date  . COLONOSCOPY WITH PROPOFOL N/A 10/25/2017   Procedure: COLONOSCOPY WITH PROPOFOL;  Surgeon: Toledo, Boykin Nearing, MD;  Location: [facility] ENDOSCOPY;  Service: Gastroenterology;  Laterality: N/A;  . ESOPHAGOGASTRODUODENOSCOPY (EGD) WITH PROPOFOL N/A 10/25/2017   Procedure: ESOPHAGOGASTRODUODENOSCOPY (EGD) WITH PROPOFOL;  Surgeon: Toledo, Boykin Nearing, MD;  Location: [facility] ENDOSCOPY;  Service: Gastroenterology;  Laterality: N/A;  . WISDOM TOOTH EXTRACTION    . WRIST SURGERY      Prior to Admission medications   Medication Sig Start Date End Date Taking? Authorizing Provider  Aspirin-Acetaminophen-Caffeine (GOODY HEADACHE PO) Take 1 packet by mouth once as needed (for headaches).    [provider]  carbamazepine (TEGRETOL-XR) 100  MG 12 hr tablet Take 1 tablet (100 mg total) by mouth 2 (two) times daily. 12/27/17 12/27/18  Oneta Rack, NP  Dexlansoprazole (DEXILANT) 30 MG capsule Take 30 mg by mouth daily.    [provider]  Ferrous Sulfate (IRON) 28 MG TABS Take 28 mg by mouth daily.    [provider]  fluticasone (FLONASE) 50 MCG/ACT nasal spray Place 1 spray into both nostrils daily.  09/08/17   [provider]  Homeopathic Products (AVENOC EX) Apply topically.    [provider]  ibuprofen (ADVIL,MOTRIN) 800 MG tablet Take 800 mg by mouth every 6 (six) hours as needed (for pain or migraines).    [provider]  Lactobacillus Rhamnosus, GG, ([employer] PROBIOTIC, LACTOBACILLUS,) CAPS Take 1 capsule by mouth daily.    [provider]  meclizine (ANTIVERT) 25 MG tablet Take 25 mg by mouth 3 (three) times daily as needed (for vertigo).  09/08/17   [provider]  Multiple Vitamins-Minerals (ONE-A-DAY WOMENS VITACRAVES) CHEW Chew 2 tablets by mouth daily.    [provider]  nicotine (NICODERM CQ) 14 mg/24hr patch Place 1 patch (14 mg total) onto the skin daily. 01/03/18   Oneta Rack, NP  nitrofurantoin, macrocrystal-monohydrate, (MACROBID) 100 MG capsule Take 1 capsule (100 mg total) by mouth 2 (two) times daily. 1 po BId Patient not taking: Reported on 12/27/2017 12/16/17   Bennie Pierini, FNP  ondansetron (ZOFRAN ODT) 4 MG disintegrating tablet Take 1 tablet (4 mg total) by mouth every 6 (six) hours as needed for nausea or vomiting. 10/07/17   Floyce Stakes,  Jesse Sans, PA-C  pantoprazole (PROTONIX) 40 MG tablet Take 40 mg by mouth daily before breakfast.     [provider]  promethazine (PHENERGAN) 25 MG tablet Take 1 tablet (25 mg total) by mouth every 6 (six) hours as needed for nausea or vomiting. Patient not taking: Reported on 12/18/2017 10/08/17   Irean Hong, MD  propranolol (INDERAL) 10 MG tablet Take 1 tablet (10 mg total) by mouth 2  (two) times daily as needed. For severe anxiety sx Patient not taking: Reported on 12/18/2017 08/17/17   Jomarie Longs, MD  sertraline (ZOLOFT) 50 MG tablet Take 1 tablet (50 mg total) by mouth daily. 12/27/17   Oneta Rack, NP  triamterene-hydrochlorothiazide (DYAZIDE) 37.5-25 MG capsule Take 1 capsule by mouth daily. 01/18/17   [provider]  VENTOLIN HFA 108 (90 Base) MCG/ACT inhaler Inhale 2 puffs into the lungs every 6 (six) hours as needed for wheezing or shortness of breath.  09/15/17   [provider]    Allergies  Allergen Reactions  . Adipex-P [Phentermine] Shortness Of Breath, Swelling, Palpitations and Hypertension    Tunnel vision, chest pain, headache, and feet became swollen  . Codeine Nausea And Vomiting  . Adhesive [Tape] Rash    Patient prefers paper tape    Family History  Problem Relation Age of Onset  . Graves' disease Mother   . Physical abuse Mother   . Drug abuse Father   . Alcohol abuse Father   . Seizures Father   . Drug abuse Sister   . Anxiety disorder Sister   . Depression Brother   . Anxiety disorder Brother   . Bipolar disorder Brother     Social History Social History   Tobacco Use  . Smoking status: Heavy Tobacco Smoker    Packs/day: 0.50    Types: Cigarettes  . Smokeless tobacco: Never Used  . Tobacco comment: Recently started and reports helps with anxiety.   Substance Use Topics  . Alcohol use: Not Currently    Comment: Occasional drinks with dinner twice a month   . Drug use: No    Review of Systems Constitutional: Negative for fever. Cardiovascular: Negative for chest pain. Respiratory: Negative for shortness of breath. Gastrointestinal: Intermittent upper abdominal pain.  No nausea vomiting or diarrhea. Genitourinary: Negative for urinary compaints Musculoskeletal: Negative for musculoskeletal complaints Skin: Negative for skin complaints  Neurological: Negative for headache All other ROS  negative  ____________________________________________   PHYSICAL EXAM:  VITAL SIGNS: ED Triage Vitals  Enc Vitals Group     BP 05/17/18 1534 125/83     Pulse Rate 05/17/18 1534 95     Resp 05/17/18 1534 20     Temp 05/17/18 1534 98.4 F (36.9 C)     Temp Source 05/17/18 1534 Oral     SpO2 05/17/18 1534 98 %     Weight 05/17/18 1536 204 lb (92.5 kg)     Height 05/17/18 1536 5\' 3"  (1.6 m)     Head Circumference --      Peak Flow --      Pain Score 05/17/18 1536 10     Pain Loc --      Pain Edu? --      Excl. in GC? --     Constitutional: Alert and oriented. Well appearing and in no distress. Eyes: Normal exam ENT   Head: Normocephalic and atraumatic.   Nose: No congestion/rhinnorhea.   Mouth/Throat: Mucous membranes are moist. Cardiovascular: Normal rate, regular rhythm.  No murmurs, rubs, or gallops. Respiratory: Normal respiratory effort without tachypnea nor retractions. Breath sounds are clear  Gastrointestinal: Soft and nontender. No distention.   Musculoskeletal: Nontender with normal range of motion in all extremities Neurologic:  Normal speech and language. No gross focal neurologic deficits Skin:  Skin is warm, dry and intact.  Psychiatric: Mood and affect are normal. Speech and behavior are normal.   ____________________________________________   RADIOLOGY  Ultrasound negative besides hemangioma.  ____________________________________________   INITIAL IMPRESSION / ASSESSMENT AND PLAN / ED COURSE  Pertinent labs & imaging results that were available during my care of the patient were reviewed by me and considered in my medical decision making (see chart for details).  Patient presents to the emergency department intermittent upper abdominal pain since 10 AM this morning.  States pain only last 1 or 2 seconds and then goes away.  Patient went to see her doctor and was sent to the ER to make sure her gallbladder was okay.  Patient denies any fever  nausea vomiting or diarrhea.  Reassuringly on exam patient has a completely benign abdominal exam describes her pain as being between the umbilicus and epigastrium when it does occur.  Denies any chest pain or shortness of breath at any point.  Patient's lab work today is nonrevealing besides an elevated white blood cell count of 16, and her ultrasound is negative.  Differential would still include gastric reflux, gastritis, peptic ulcer disease.  Patient states she used to be on reflux medications but is been off of these medications for approximately 1 year.  We will dose a GI cocktail in the emergency department.  Overall the patient appears extremely well currently with a benign abdominal exam.  I believe the patient would be safe for discharge home with GI follow-up and starting the patient on Protonix for trial of 2 months.  I discussed very strict return precautions for any worsening pain any development of chest pain or trouble breathing or development of fever.  Patient agreeable to plan of care.    ____________________________________________   FINAL CLINICAL IMPRESSION(S) / ED DIAGNOSES  Epigastric pain   Minna AntisPaduchowski, Nayab Aten, MD 05/17/18 2316

## 2018-05-17 NOTE — ED Triage Notes (Signed)
Pt has upper abd pain.  Sharp intermittent pain.  Pt sent for eval of gallbladder from doctor's office.  No n/v/d. Pt alert.

## 2018-05-17 NOTE — Discharge Instructions (Signed)
To the emergency department for any worsening abdominal pain, development of fever, chest pain or shortness of breath, or any other symptom personally concerning to yourself.

## 2018-05-17 NOTE — ED Notes (Signed)
Patient discharged to home per MD order. Patient in stable condition, and deemed medically cleared by ED provider for discharge. Discharge instructions reviewed with patient/family using "Teach Back"; verbalized understanding of medication education and administration, and information about follow-up care. Denies further concerns. ° °

## 2018-05-30 ENCOUNTER — Emergency Department: Payer: BLUE CROSS/BLUE SHIELD

## 2018-05-30 ENCOUNTER — Emergency Department
Admission: EM | Admit: 2018-05-30 | Discharge: 2018-05-30 | Disposition: A | Discharge status: Home / Self Care | Payer: BLUE CROSS/BLUE SHIELD | Attending: Student in an Organized Health Care Education/Training Program | Admitting: Student in an Organized Health Care Education/Training Program

## 2018-05-30 ENCOUNTER — Other Ambulatory Visit: Payer: Self-pay

## 2018-05-30 ENCOUNTER — Encounter: Payer: Self-pay | Admitting: Emergency Medicine

## 2018-05-30 DIAGNOSIS — B349 Viral infection, unspecified: Secondary | ICD-10-CM | POA: Diagnosis not present

## 2018-05-30 DIAGNOSIS — Z79899 Other long term (current) drug therapy: Secondary | ICD-10-CM | POA: Insufficient documentation

## 2018-05-30 DIAGNOSIS — F17210 Nicotine dependence, cigarettes, uncomplicated: Secondary | ICD-10-CM | POA: Diagnosis not present

## 2018-05-30 DIAGNOSIS — I2510 Atherosclerotic heart disease of native coronary artery without angina pectoris: Secondary | ICD-10-CM | POA: Diagnosis not present

## 2018-05-30 DIAGNOSIS — R0789 Other chest pain: Secondary | ICD-10-CM | POA: Diagnosis not present

## 2018-05-30 DIAGNOSIS — R05 Cough: Secondary | ICD-10-CM | POA: Diagnosis present

## 2018-05-30 LAB — CBC
HCT: 38.8 % (ref 36.0–46.0)
Hemoglobin: 12.6 g/dL (ref 12.0–15.0)
MCH: 28.3 pg (ref 26.0–34.0)
MCHC: 32.5 g/dL (ref 30.0–36.0)
MCV: 87 fL (ref 80.0–100.0)
PLATELETS: 298 10*3/uL (ref 150–400)
RBC: 4.46 MIL/uL (ref 3.87–5.11)
RDW: 12.7 % (ref 11.5–15.5)
WBC: 15.6 10*3/uL — ABNORMAL HIGH (ref 4.0–10.5)
nRBC: 0 % (ref 0.0–0.2)

## 2018-05-30 LAB — BASIC METABOLIC PANEL
ANION GAP: 9 (ref 5–15)
BUN: 12 mg/dL (ref 6–20)
CO2: 22 mmol/L (ref 22–32)
Calcium: 9.1 mg/dL (ref 8.9–10.3)
Chloride: 105 mmol/L (ref 98–111)
Creatinine, Ser: 0.47 mg/dL (ref 0.44–1.00)
GFR calc Af Amer: >60 mL/min (ref >60)
GFR calc non Af Amer: >60 mL/min (ref >60)
Glucose, Bld: 101 mg/dL — ABNORMAL HIGH (ref 70–99)
POTASSIUM: 4 mmol/L (ref 3.5–5.1)
Sodium: 136 mmol/L (ref 135–145)

## 2018-05-30 LAB — TROPONIN I: Troponin I: <0.03 ng/mL (ref <0.03)

## 2018-05-30 MED ORDER — SODIUM CHLORIDE 0.9% FLUSH: 3.0000 mL | Freq: Once | INTRAVENOUS | Status: DC

## 2018-05-30 NOTE — Discharge Instructions (Addendum)
You have a viral illness which can have symptoms like muscle aches, fevers, chills, runny nose, cough, sneezing, sore throat, vomiting or diarrhea. One of the viruses that can cause this is SARS- CoV-2, the virus that causes COVID-19, also known as the novel coronavirus. You could also have a different viral infection such as the common cold or flu. Most patients with viral illness including COVID-19 have mild symptoms and recover on their own. Resting, staying hydrated, and sleeping are helpful. Today we think you are well enough to go home and treat your symptoms with oral liquids, and medicine for fevers, cough, and pain. ° °We generally do not do COVID-19 testing on people with mild symptoms who are being discharged from the Emergency Department or Clinic.  ° °If we did a test for COVID-19 the results will not be available for several days. We will call you with the result. Please DO NOT CONTACT THE EMERGENCY DEPARTMENT OR CLINIC FOR RESULTS OF THIS TEST.  ° °Please follow the precautions below:  °Stay home for at least 7 days after your symptoms began OR for 3 days after your fever ends, whichever takes longer. ? ° °If people live with you they should also stay home and avoid contact with others for 14 days.? ° ° °ISOLATION GUIDANCE FOR POSSIBLE COVID °Most people with cough and fever have an illness caused by a virus. One is COVID-19. Not all people with these infections are being tested for the virus that causes COVID-19. People who might have COVID but are not being tested should still try to prevent the spread of the infection. ° °These instructions are modified recommendations from the [place] Department of Health and Human Services. ° °People who might have COVID-19 should follow the instructions below until a doctor or health department says they can stop. ° °Stay home unless you need to see a doctor °Stop doing things outside your home except for getting medical care. Do not go to work, school,  or public areas; and do not use public transportation or taxis. ° °Call ahead before visiting the doctor °Before your appointment, call the doctor's office and tell them about your symptoms. This will help them take steps to keep other people from getting infected.  ° °Keep track of your symptoms °Symptoms are the things you feel, like fever or trouble breathing. Go to your doctor or the ER if you think you are getting worse, like having more trouble breathing. Call the doctor's office and tell them about your symptoms. This will help them take steps to keep other people from getting sick.  ° °Wear a face mask °You should wear a face mask that covers your nose and mouth when you are in the same room with other people and when you visit the doctor's office. People who live with you or visit you should also wear a face mask when they are in the same room with you. ° °Separate yourself from other people in your home °You should stay in a different room from other people in your home. You should stay separate from your family members as much as possible. You should use a separate bathroom if you can. ° °Avoid sharing things in your house °Don't share dishes, drinking glasses, cups, eating utensils, towels, bedding, or other things with people in your home. After using these things please wash them really well with soap and water. ° °Cover your coughs and sneezes °Cover your mouth and nose with a tissue when you   cough or sneeze, or you can cough or sneeze into your sleeve. Throw used tissues in a trash can that has a bag in it, and immediately wash your hands with soap and water for at least 20 seconds. If you use an alcohol-based hand rub please rub your hands together for 20 seconds. ° °Wash your hands °Wash your hands often and very well with soap and water for at least 20 seconds. If your hands are not visibly dirty you can use an alcohol-based hand sanitizer. Don't touch your eyes, nose, or mouth with unwashed  hands. ° ° ° °Instructions for People Helping Care for Patients with Possible COVID-19 °Follow your doctor's instructions °Make sure that you understand and can help the patient follow any instructions for care. ° °Provide for the patient's basic needs °You should help the patient with basic needs in the home and provide support for getting groceries, prescriptions, and other personal needs. ° °Keep track of the patient's symptoms °If they are getting sicker, call his or her doctor. This will help the doctor's office take steps to keep other people from getting infected. ° °Limit the number of people who have contact with the patient °If possible, have only one caregiver for the patient. °Other family members should stay in another home or place of residence. If they can't, they should stay in another room and stay separated from the patient as much as possible. °Keep one bathroom JUST for the patient if you can.  °Only allow visitors if they MUST be in the home. ° °Keep older adults, very young children, and other sick people away from the patient °Keep older adults, very young children, and people who have compromised immune systems or chronic health conditions away from the patient. This includes people with chronic heart, lung, or kidney conditions, diabetes, and cancer. ° °Wash your hands often °Avoid touching your eyes, nose, and mouth with unwashed hands. °Wash your hands often and thoroughly with soap and water for at least 20 seconds. You can use an alcohol-based hand sanitizer if soap and water are not available and if your hands are not visibly dirty.  °Use disposable paper towels to dry your hands. If not available, use dedicated cloth towels and replace them when they become wet. ° °Avoid contamination from face masks and gloves °Wear a disposable face mask and gloves whenever you are touching the patient, things in their room or bathroom, or things that can have their body fluid on them, like bedding  or dishes, or blood, vomit, urine, or feces (poop).  °Ensure the mask fits over your nose and mouth tightly, and do not touch it at all while you are wearing it. °Throw out disposable facemasks and gloves after using them. Do not reuse. °Wash your hands immediately after removing your facemask and gloves. °If your personal clothing becomes dirty with a patient's body fluids, carefully remove clothing and launder. Wash your hands after handling dirty clothing. °Place all used disposable facemasks, gloves, and other waste in a lined container before disposing them with other household garbage.  °Remove gloves and wash your hands immediately after handling these items. ° °Do not share dishes, glasses, or other household items with the patient °Avoid sharing household items. You should not share dishes, drinking glasses, cups, eating utensils, towels, bedding, or other items with a patient who is confirmed to have, or being evaluated for, COVID-19 infection.  °After the person uses these items, you should wash them very well with soap and   water. ° °Wash laundry thoroughly °Immediately remove and wash clothes or bedding that have blood, body fluids, and/or secretions or excretions, such as sweat, saliva, sputum, nasal mucus, vomit, urine, or feces, on them.  °Wear gloves when handling laundry from the patient.  °Read and follow directions on labels of laundry or clothing items and detergent. In general, wash and dry with the warmest temperatures recommended on the label. ° °Clean all areas the individual has used  °Clean all touchable surfaces, such as counters, tabletops, doorknobs, bathroom fixtures, toilets, phones, keyboards, tablets, and bedside tables, every day. Also, clean any surfaces that may have blood, body fluids, and/or secretions or excretions on them. Wear gloves when cleaning surfaces the patient has come in contact with.  °Use a diluted bleach solution (dilute bleach with 1 part bleach and 10 parts  water) or a household disinfectant with a label that says EPA-registered for coronaviruses. To make a bleach solution at home, add 1 tablespoon of bleach to 1 quart (4 cups) of water. For a larger supply, add ¼ cup of bleach to 1 gallon (16 cups) of water.  °Read labels of cleaning products and follow recommendations provided on product labels. Labels contain instructions for safe and effective use of the cleaning product including precautions you should take when applying the product, such as wearing gloves or eye protection and making sure you have good ventilation during use of the product.  °Remove gloves and wash hands immediately after cleaning. ° °Monitor yourself for signs and symptoms of illness °Caregivers and household members are considered close contacts, should monitor their health, and will be asked to limit movement outside of the home as much as possible.  ° °If you have additional questions °Contact  °Your Doctor or Healthcare Provider °The [facility] website (http://www.[facility].edu/coronavirus) °The [facility] COVID hotline 336-70COVID °Your local health department  ° °This guidance is subject to change. For the most up to date guidance, check the state Department of Health website at [facility].GOV ° °

## 2018-05-30 NOTE — ED Provider Notes (Signed)
[facility] Emergency Department Provider Note    First MD Initiated Contact with Patient 05/30/18 2213     (approximate)  I have reviewed the triage vital signs and the nursing notes.   HISTORY  Chief Complaint Cough; Generalized Body Aches; and Fever    HPI Sonya [name] is a 28 y.o. female the below listed past medical history currently living at home with her spouse who is high risk for coated being kept in home quarantine presents the ER for chest pain low-grade temperature and cough.  Denies any recent antibiotic use.  Does not smoke.  States he does have a history of coronary artery disease that runs in her family.  States that she was having chest discomfort that was on the right side of her chest and started earlier this afternoon.  Not worsened with exertional but is worsened with movement of her right arm.    Past Medical History:  Diagnosis Date  . Anxiety   . Depression   . GERD (gastroesophageal reflux disease) 10/2017  . Meniere disease UNKNOWN  . Vertigo    Family History  Problem Relation Age of Onset  . Graves' disease Mother   . Physical abuse Mother   . Drug abuse Father   . Alcohol abuse Father   . Seizures Father   . Drug abuse Sister   . Anxiety disorder Sister   . Depression Brother   . Anxiety disorder Brother   . Bipolar disorder Brother    Past Surgical History:  Procedure Laterality Date  . COLONOSCOPY WITH PROPOFOL N/A 10/25/2017   Procedure: COLONOSCOPY WITH PROPOFOL;  Surgeon: Toledo, Boykin Nearing, MD;  Location: [facility] ENDOSCOPY;  Service: Gastroenterology;  Laterality: N/A;  . ESOPHAGOGASTRODUODENOSCOPY (EGD) WITH PROPOFOL N/A 10/25/2017   Procedure: ESOPHAGOGASTRODUODENOSCOPY (EGD) WITH PROPOFOL;  Surgeon: Toledo, Boykin Nearing, MD;  Location: [facility] ENDOSCOPY;  Service: Gastroenterology;  Laterality: N/A;  . WISDOM TOOTH EXTRACTION    . WRIST SURGERY     Patient Active Problem List   Diagnosis Date Noted  . Indication  for care in labor and delivery, delivered 12/26/2014  . Indication for care or intervention related to labor and delivery 12/23/2014  . Labor and delivery, indication for care 10/13/2014      Prior to Admission medications   Medication Sig Start Date End Date Taking? Authorizing Provider  Aspirin-Acetaminophen-Caffeine (GOODY HEADACHE PO) Take 1 packet by mouth once as needed (for headaches).    [provider]  carbamazepine (TEGRETOL-XR) 100 MG 12 hr tablet Take 1 tablet (100 mg total) by mouth 2 (two) times daily. 12/27/17 12/27/18  Oneta Rack, NP  Dexlansoprazole (DEXILANT) 30 MG capsule Take 30 mg by mouth daily.    [provider]  Ferrous Sulfate (IRON) 28 MG TABS Take 28 mg by mouth daily.    [provider]  fluticasone (FLONASE) 50 MCG/ACT nasal spray Place 1 spray into both nostrils daily.  09/08/17   [provider]  Homeopathic Products (AVENOC EX) Apply topically.    [provider]  ibuprofen (ADVIL,MOTRIN) 800 MG tablet Take 800 mg by mouth every 6 (six) hours as needed (for pain or migraines).    [provider]  Lactobacillus Rhamnosus, GG, ([employer] PROBIOTIC, LACTOBACILLUS,) CAPS Take 1 capsule by mouth daily.    [provider]  meclizine (ANTIVERT) 25 MG tablet Take 25 mg by mouth 3 (three) times daily as needed (for vertigo).  09/08/17   [provider]  Multiple Vitamins-Minerals (ONE-A-DAY  WOMENS VITACRAVES) CHEW Chew 2 tablets by mouth daily.    [provider]  nicotine (NICODERM CQ) 14 mg/24hr patch Place 1 patch (14 mg total) onto the skin daily. 01/03/18   Oneta RackLewis, Tanika N, NP  nitrofurantoin, macrocrystal-monohydrate, (MACROBID) 100 MG capsule Take 1 capsule (100 mg total) by mouth 2 (two) times daily. 1 po BId Patient not taking: Reported on 12/27/2017 12/16/17   Bennie PieriniMartin, Mary-Margaret, FNP  ondansetron (ZOFRAN ODT) 4 MG disintegrating tablet Take 1 tablet (4 mg total) by mouth every 6  (six) hours as needed for nausea or vomiting. 10/07/17   Evon SlackGaines, [name] C, PA-C  pantoprazole (PROTONIX) 40 MG tablet Take 1 tablet (40 mg total) by mouth daily. 05/17/18 05/17/19  Minna AntisPaduchowski, Kevin, MD  promethazine (PHENERGAN) 25 MG tablet Take 1 tablet (25 mg total) by mouth every 6 (six) hours as needed for nausea or vomiting. Patient not taking: Reported on 12/18/2017 10/08/17   Irean HongSung, Jade J, MD  propranolol (INDERAL) 10 MG tablet Take 1 tablet (10 mg total) by mouth 2 (two) times daily as needed. For severe anxiety sx Patient not taking: Reported on 12/18/2017 08/17/17   Jomarie LongsEappen, Saramma, MD  sertraline (ZOLOFT) 50 MG tablet Take 1 tablet (50 mg total) by mouth daily. 12/27/17   Oneta RackLewis, Tanika N, NP  triamterene-hydrochlorothiazide (DYAZIDE) 37.5-25 MG capsule Take 1 capsule by mouth daily. 01/18/17   [provider]  VENTOLIN HFA 108 (90 Base) MCG/ACT inhaler Inhale 2 puffs into the lungs every 6 (six) hours as needed for wheezing or shortness of breath.  09/15/17   [provider]    Allergies Adipex-p [phentermine]; Codeine; and Adhesive [tape]    Social History Social History   Tobacco Use  . Smoking status: Heavy Tobacco Smoker    Packs/day: 0.50    Types: Cigarettes  . Smokeless tobacco: Never Used  . Tobacco comment: Recently started and reports helps with anxiety.   Substance Use Topics  . Alcohol use: Not Currently    Comment: Occasional drinks with dinner twice a month   . Drug use: No    Review of Systems Patient denies headaches, rhinorrhea, blurry vision, numbness, shortness of breath, chest pain, edema, cough, abdominal pain, nausea, vomiting, diarrhea, dysuria, fevers, rashes or hallucinations unless otherwise stated above in HPI. ____________________________________________   PHYSICAL EXAM:  VITAL SIGNS: Vitals:   05/30/18 2141 05/30/18 2151  BP: 122/88   Pulse: 95   Resp: 18   Temp:  98.3 F (36.8 C)  SpO2: 98%     Constitutional: Alert  and oriented.  Eyes: Conjunctivae are normal.  Head: Atraumatic. Nose: No congestion/rhinnorhea. Mouth/Throat: Mucous membranes are moist.   Neck: No stridor. Painless ROM.  Cardiovascular: Normal rate, regular rhythm. Grossly normal heart sounds.  Good peripheral circulation. Respiratory: Normal respiratory effort.  No retractions. Lungs CTAB. Gastrointestinal: Soft and nontender. No distention. No abdominal bruits. No CVA tenderness. Genitourinary:  Musculoskeletal: No lower extremity tenderness nor edema.  No joint effusions. Neurologic:  Normal speech and language. No gross focal neurologic deficits are appreciated. No facial droop Skin:  Skin is warm, dry and intact. No rash noted. Psychiatric: Mood and affect are normal. Speech and behavior are normal.  ____________________________________________   LABS (all labs ordered are listed, but only abnormal results are displayed)  Results for orders placed or performed during the hospital encounter of 05/30/18 (from the past 24 hour(s))  Basic metabolic panel     Status: Abnormal   Collection Time: 05/30/18  9:59 PM  Result Value Ref Range   Sodium 136 135 - 145 mmol/L   Potassium 4.0 3.5 - 5.1 mmol/L   Chloride 105 98 - 111 mmol/L   CO2 22 22 - 32 mmol/L   Glucose, Bld 101 (H) 70 - 99 mg/dL   BUN 12 6 - 20 mg/dL   Creatinine, Ser 2.59 0.44 - 1.00 mg/dL   Calcium 9.1 8.9 - 56.3 mg/dL   GFR calc non Af Amer >60 >60 mL/min   GFR calc Af Amer >60 >60 mL/min   Anion gap 9 5 - 15  CBC     Status: Abnormal   Collection Time: 05/30/18  9:59 PM  Result Value Ref Range   WBC 15.6 (H) 4.0 - 10.5 K/uL   RBC 4.46 3.87 - 5.11 MIL/uL   Hemoglobin 12.6 12.0 - 15.0 g/dL   HCT 87.5 64.3 - 32.9 %   MCV 87.0 80.0 - 100.0 fL   MCH 28.3 26.0 - 34.0 pg   MCHC 32.5 30.0 - 36.0 g/dL   RDW 51.8 84.1 - 66.0 %   Platelets 298 150 - 400 K/uL   nRBC 0.0 0.0 - 0.2 %  Troponin I - ONCE - STAT     Status: None   Collection Time: 05/30/18  9:59 PM   Result Value Ref Range   Troponin I <0.03 <0.03 ng/mL   ____________________________________________  EKG My review and personal interpretation at Time: 21:55   Indication: chest pain  Rate: 95  Rhythm: sinus Axis: normal Other: normal intervals, no stemi ____________________________________________  RADIOLOGY  I personally reviewed all radiographic images ordered to evaluate for the above acute complaints and reviewed radiology reports and findings.  These findings were personally discussed with the patient.  Please see medical record for radiology report.  ____________________________________________   PROCEDURES  Procedure(s) performed:  Procedures    Critical Care performed: no ____________________________________________   INITIAL IMPRESSION / ASSESSMENT AND PLAN / ED COURSE  Pertinent labs & imaging results that were available during my care of the patient were reviewed by me and considered in my medical decision making (see chart for details).   DDX: Pneumonia, flu, bronchiolitis, coronavirus, ACS  Sonya [name] is a 28 y.o. who presents to the ED with Patient's vital signs are stable and within normal limits including having no fever and no tachycardia.  Trope negative.  EKG without any acute ischemia.  She is low risk by heart score.  PERC negative.  Low risk for ACS.  No worries some travel or sick contacts that would suggest an increased risk of COVID-19.  This patient does not meet criteria for testing currently and I explained that in detail.  The work-up today is reassuring with no evidence of emergent medical condition that requires further work-up or evaluation or inpatient treatment.  I gave my usual and customary follow-up recommendations and return precautions and the patient understands and agrees with the plan.       As part of my medical decision making, I reviewed the following data within the electronic MEDICAL RECORD NUMBER Nursing notes reviewed and  incorporated, Labs reviewed, notes from prior ED visits.   ____________________________________________   FINAL CLINICAL IMPRESSION(S) / ED DIAGNOSES  Final diagnoses:  Viral syndrome  Atypical chest pain      NEW MEDICATIONS STARTED DURING THIS VISIT:  New Prescriptions   No medications on file     Note:  This document was prepared using Dragon voice recognition software and may include unintentional dictation  errors.    Willy Eddy, MD 05/30/18 2255

## 2018-05-30 NOTE — ED Triage Notes (Signed)
Pt to ED from home c/o non productive cough, congestion, fever 100.1 at home, chills, diarrhea.  States took 1g tylenol around 2030 and robotusin.  States has friends traveled in from [place] and [place] recently, living with husband who is deemed suspicious COVID pt but not tested d/t young age and otherwise healthy, house is quarantine.  Pt speaking in complete and coherent sentences, chest rise even and unlabored, in NAD at this time.

## 2018-07-07 ENCOUNTER — Ambulatory Visit: Admission: RE | Admit: 2018-07-07 | Payer: Self-pay | Source: Ambulatory Visit

## 2018-07-07 ENCOUNTER — Other Ambulatory Visit: Payer: Self-pay | Admitting: Physician Assistant

## 2018-07-07 ENCOUNTER — Ambulatory Visit
Admission: RE | Admit: 2018-07-07 | Discharge: 2018-07-07 | Disposition: A | Discharge status: Home / Self Care | Payer: BLUE CROSS/BLUE SHIELD | Source: Ambulatory Visit | Attending: Physician Assistant | Admitting: Physician Assistant

## 2018-07-07 ENCOUNTER — Inpatient Hospital Stay: Admit: 2018-07-07 | Payer: Self-pay

## 2018-07-07 ENCOUNTER — Other Ambulatory Visit: Payer: Self-pay

## 2018-07-07 DIAGNOSIS — M7989 Other specified soft tissue disorders: Secondary | ICD-10-CM | POA: Diagnosis present

## 2018-07-07 DIAGNOSIS — M79671 Pain in right foot: Secondary | ICD-10-CM

## 2018-07-31 DIAGNOSIS — M65341 Trigger finger, right ring finger: Secondary | ICD-10-CM | POA: Insufficient documentation

## 2018-07-31 DIAGNOSIS — R768 Other specified abnormal immunological findings in serum: Secondary | ICD-10-CM | POA: Insufficient documentation

## 2018-08-13 DIAGNOSIS — M1990 Unspecified osteoarthritis, unspecified site: Secondary | ICD-10-CM | POA: Insufficient documentation

## 2018-08-13 DIAGNOSIS — Z79899 Other long term (current) drug therapy: Secondary | ICD-10-CM | POA: Insufficient documentation

## 2018-09-18 DIAGNOSIS — M109 Gout, unspecified: Secondary | ICD-10-CM | POA: Insufficient documentation

## 2018-09-22 ENCOUNTER — Encounter: Payer: Self-pay | Admitting: Emergency Medicine

## 2018-09-22 ENCOUNTER — Other Ambulatory Visit: Payer: Self-pay

## 2018-09-22 ENCOUNTER — Emergency Department
Admission: EM | Admit: 2018-09-22 | Discharge: 2018-09-22 | Disposition: A | Discharge status: Home / Self Care | Payer: BLUE CROSS/BLUE SHIELD | Attending: Emergency Medicine | Admitting: Emergency Medicine

## 2018-09-22 DIAGNOSIS — I10 Essential (primary) hypertension: Secondary | ICD-10-CM | POA: Insufficient documentation

## 2018-09-22 DIAGNOSIS — Z5321 Procedure and treatment not carried out due to patient leaving prior to being seen by health care provider: Secondary | ICD-10-CM | POA: Diagnosis not present

## 2018-09-22 LAB — URINALYSIS, COMPLETE (UACMP) WITH MICROSCOPIC
Bacteria, UA: NONE SEEN
Bilirubin Urine: NEGATIVE
Glucose, UA: NEGATIVE mg/dL
Hgb urine dipstick: NEGATIVE
Ketones, ur: NEGATIVE mg/dL
Nitrite: NEGATIVE
Protein, ur: NEGATIVE mg/dL
Specific Gravity, Urine: 1.021 (ref 1.005–1.030)
pH: 7 (ref 5.0–8.0)

## 2018-09-22 LAB — POCT PREGNANCY, URINE: Preg Test, Ur: NEGATIVE

## 2018-09-22 NOTE — ED Triage Notes (Signed)
Pt arrives ambulatory and POV to triage with c/o "feeling funny" and HTN with the highest BP reaching 153/89. Pt is in NAD.

## 2018-09-22 NOTE — ED Notes (Signed)
Patient states going outside to speak with family.

## 2018-10-08 DIAGNOSIS — F3175 Bipolar disorder, in partial remission, most recent episode depressed: Secondary | ICD-10-CM | POA: Insufficient documentation

## 2018-10-23 ENCOUNTER — Other Ambulatory Visit: Payer: Self-pay | Admitting: Certified Nurse Midwife

## 2018-10-23 DIAGNOSIS — N6320 Unspecified lump in the left breast, unspecified quadrant: Secondary | ICD-10-CM

## 2018-10-24 ENCOUNTER — Ambulatory Visit
Admission: RE | Admit: 2018-10-24 | Discharge: 2018-10-24 | Disposition: A | Discharge status: Home / Self Care | Payer: BLUE CROSS/BLUE SHIELD | Source: Ambulatory Visit | Attending: Certified Nurse Midwife | Admitting: Certified Nurse Midwife

## 2018-10-24 DIAGNOSIS — N6320 Unspecified lump in the left breast, unspecified quadrant: Secondary | ICD-10-CM | POA: Diagnosis not present

## 2018-12-17 ENCOUNTER — Other Ambulatory Visit: Payer: Self-pay | Admitting: Family

## 2018-12-17 DIAGNOSIS — F331 Major depressive disorder, recurrent, moderate: Secondary | ICD-10-CM

## 2019-01-11 ENCOUNTER — Emergency Department
Admission: EM | Admit: 2019-01-11 | Discharge: 2019-01-11 | Disposition: A | Discharge status: Home / Self Care | Payer: BLUE CROSS/BLUE SHIELD | Attending: Emergency Medicine | Admitting: Emergency Medicine

## 2019-01-11 ENCOUNTER — Other Ambulatory Visit: Payer: Self-pay

## 2019-01-11 ENCOUNTER — Encounter: Payer: Self-pay | Admitting: Intensive Care

## 2019-01-11 DIAGNOSIS — Z79899 Other long term (current) drug therapy: Secondary | ICD-10-CM | POA: Insufficient documentation

## 2019-01-11 DIAGNOSIS — O99331 Smoking (tobacco) complicating pregnancy, first trimester: Secondary | ICD-10-CM | POA: Insufficient documentation

## 2019-01-11 DIAGNOSIS — Z3A09 9 weeks gestation of pregnancy: Secondary | ICD-10-CM | POA: Diagnosis not present

## 2019-01-11 DIAGNOSIS — O219 Vomiting of pregnancy, unspecified: Secondary | ICD-10-CM

## 2019-01-11 DIAGNOSIS — R5383 Other fatigue: Secondary | ICD-10-CM | POA: Insufficient documentation

## 2019-01-11 DIAGNOSIS — F17210 Nicotine dependence, cigarettes, uncomplicated: Secondary | ICD-10-CM | POA: Insufficient documentation

## 2019-01-11 LAB — COMPREHENSIVE METABOLIC PANEL
ALT: 15 U/L (ref 0–44)
AST: 18 U/L (ref 15–41)
Albumin: 3.7 g/dL (ref 3.5–5.0)
Alkaline Phosphatase: 51 U/L (ref 38–126)
Anion gap: 10 (ref 5–15)
BUN: 9 mg/dL (ref 6–20)
CO2: 25 mmol/L (ref 22–32)
Calcium: 9.5 mg/dL (ref 8.9–10.3)
Chloride: 100 mmol/L (ref 98–111)
Creatinine, Ser: 0.52 mg/dL (ref 0.44–1.00)
GFR calc Af Amer: >60 mL/min (ref >60)
GFR calc non Af Amer: >60 mL/min (ref >60)
Glucose, Bld: 106 mg/dL — ABNORMAL HIGH (ref 70–99)
Potassium: 3.2 mmol/L — ABNORMAL LOW (ref 3.5–5.1)
Sodium: 135 mmol/L (ref 135–145)
Total Bilirubin: 0.3 mg/dL (ref 0.3–1.2)
Total Protein: 7.3 g/dL (ref 6.5–8.1)

## 2019-01-11 LAB — URINALYSIS, COMPLETE (UACMP) WITH MICROSCOPIC
Bilirubin Urine: NEGATIVE
Glucose, UA: NEGATIVE mg/dL
Hgb urine dipstick: NEGATIVE
Ketones, ur: NEGATIVE mg/dL
Nitrite: NEGATIVE
Protein, ur: NEGATIVE mg/dL
Specific Gravity, Urine: 1.011 (ref 1.005–1.030)
pH: 7 (ref 5.0–8.0)

## 2019-01-11 LAB — CBC
HCT: 37.6 % (ref 36.0–46.0)
Hemoglobin: 12.5 g/dL (ref 12.0–15.0)
MCH: 28.5 pg (ref 26.0–34.0)
MCHC: 33.2 g/dL (ref 30.0–36.0)
MCV: 85.6 fL (ref 80.0–100.0)
Platelets: 278 10*3/uL (ref 150–400)
RBC: 4.39 MIL/uL (ref 3.87–5.11)
RDW: 13 % (ref 11.5–15.5)
WBC: 13.9 10*3/uL — ABNORMAL HIGH (ref 4.0–10.5)
nRBC: 0 % (ref 0.0–0.2)

## 2019-01-11 LAB — LIPASE, BLOOD: Lipase: 31 U/L (ref 11–51)

## 2019-01-11 LAB — POCT PREGNANCY, URINE: Preg Test, Ur: POSITIVE — AB

## 2019-01-11 LAB — HCG, QUANTITATIVE, PREGNANCY: hCG, Beta Chain, Quant, S: 71063 m[IU]/mL — ABNORMAL HIGH (ref <5)

## 2019-01-11 MED ORDER — METOCLOPRAMIDE HCL 10 MG PO TABS
10.0000 mg | ORAL_TABLET | Freq: Once | ORAL | Status: AC
  Administered 2019-01-11: 10 mg via ORAL
  Filled 2019-01-11: qty 1

## 2019-01-11 MED ORDER — METOCLOPRAMIDE HCL 5 MG PO TABS: 5.0000 mg | ORAL_TABLET | Freq: Three times a day (TID) | ORAL | 0 refills | Status: DC | PRN

## 2019-01-11 NOTE — ED Notes (Signed)
ED Provider at bedside. 

## 2019-01-11 NOTE — Discharge Instructions (Signed)
Your exam and labs are normal at this time. Take the prescription nausea medicine as needed. Select foods that are carb-rich, to prevent nausea and vomiting. Follow-up with your OB provider or return to the ED as needed.

## 2019-01-11 NOTE — ED Triage Notes (Signed)
Reports being [redacted] weeks pregnant.Patient c/o weakness and black stools that started today. Does take prenatal daily. Abdominal cramping yesterday. Reports very light brown spotting on Wednesday

## 2019-01-11 NOTE — ED Notes (Signed)
This RN introduced self to pt. Pt c/o generalized weakness that started today. Pt states she is about 8-[redacted] weeks pregnant. Pt states she has also noticed black tarry stools.

## 2019-01-11 NOTE — ED Provider Notes (Signed)
[facility] Emergency Department Provider Note ____________________________________________  Time seen: 1645  I have reviewed the triage vital signs and the nursing notes.  HISTORY  Chief Complaint  Fatigue  HPI Sonya [name] is a 28 y.o.G2P1 female presents itself to the ED for evaluation of 1 day complaint of fatigue and weakness.  Patient admittedly has had prolonged nausea without vomiting, during the 9 weeks of her current pregnancy.  She reports decreased solid food intake due to nausea-induced food-aversion. She has been able to eat cereal and milk as a regular daily meal. She is not currently taking any medications for emesis.  She is under the care of Dr. Leeroy Bockhelsea Ward at [facility].  She also reports a single episode of firm stool today that she described as being dark in color, and "nearly black.".  She has had no other GI symptoms including (coffee-ground) emesis, reflux, heartburn, or BRB per rectum. She has history of reflux, but is not currently on any therapy. She denies fevers, chills, sweats, or chest pain.   Past Medical History:  Diagnosis Date  . Anxiety   . Depression   . GERD (gastroesophageal reflux disease) 10/2017  . Meniere disease UNKNOWN  . Vertigo     Patient Active Problem List   Diagnosis Date Noted  . Indication for care in labor and delivery, delivered 12/26/2014  . Indication for care or intervention related to labor and delivery 12/23/2014  . Labor and delivery, indication for care 10/13/2014    Past Surgical History:  Procedure Laterality Date  . COLONOSCOPY WITH PROPOFOL N/A 10/25/2017   Procedure: COLONOSCOPY WITH PROPOFOL;  Surgeon: Toledo, Boykin Nearingeodoro K, MD;  Location: [facility] ENDOSCOPY;  Service: Gastroenterology;  Laterality: N/A;  . ESOPHAGOGASTRODUODENOSCOPY (EGD) WITH PROPOFOL N/A 10/25/2017   Procedure: ESOPHAGOGASTRODUODENOSCOPY (EGD) WITH PROPOFOL;  Surgeon: Toledo, Boykin Nearingeodoro K, MD;  Location: [facility] ENDOSCOPY;   Service: Gastroenterology;  Laterality: N/A;  . WISDOM TOOTH EXTRACTION    . WRIST SURGERY      Prior to Admission medications   Medication Sig Start Date End Date Taking? Authorizing Provider  meclizine (ANTIVERT) 25 MG tablet Take 25 mg by mouth 3 (three) times daily as needed (for vertigo).  09/08/17   [provider]  metoCLOPramide (REGLAN) 5 MG tablet Take 1 tablet (5 mg total) by mouth every 8 (eight) hours as needed for nausea or vomiting (Take 1-2 tabs TID prn N/V). 01/11/19   Abdulai Blaylock, Charlesetta IvoryJenise V Bacon, PA-C  nicotine (NICODERM CQ) 14 mg/24hr patch Place 1 patch (14 mg total) onto the skin daily. 01/03/18   Oneta RackLewis, Tanika N, NP  promethazine (PHENERGAN) 25 MG tablet Take 1 tablet (25 mg total) by mouth every 6 (six) hours as needed for nausea or vomiting. Patient not taking: Reported on 12/18/2017 10/08/17   Irean HongSung, Jade J, MD  sertraline (ZOLOFT) 50 MG tablet Take 1 tablet (50 mg total) by mouth daily. 12/27/17   Oneta RackLewis, Tanika N, NP  triamterene-hydrochlorothiazide (DYAZIDE) 37.5-25 MG capsule Take 1 capsule by mouth daily. 01/18/17   [provider]  VENTOLIN HFA 108 (90 Base) MCG/ACT inhaler Inhale 2 puffs into the lungs every 6 (six) hours as needed for wheezing or shortness of breath.  09/15/17   [provider]  carbamazepine (TEGRETOL-XR) 100 MG 12 hr tablet Take 1 tablet (100 mg total) by mouth 2 (two) times daily. 12/27/17 01/11/19  Oneta RackLewis, Tanika N, NP  Dexlansoprazole (DEXILANT) 30 MG capsule Take 30 mg by mouth daily.  01/11/19  [provider]  Ferrous Sulfate (IRON) 28 MG TABS Take 28 mg by mouth daily.  01/11/19  [provider]  fluticasone (FLONASE) 50 MCG/ACT nasal spray Place 1 spray into both nostrils daily.  09/08/17 01/11/19  [provider]  pantoprazole (PROTONIX) 40 MG tablet Take 1 tablet (40 mg total) by mouth daily. 05/17/18 01/11/19  Minna Antis, MD  propranolol (INDERAL) 10 MG tablet Take 1 tablet (10 mg total) by  mouth 2 (two) times daily as needed. For severe anxiety sx Patient not taking: Reported on 12/18/2017 08/17/17 01/11/19  Jomarie Longs, MD    Allergies Adipex-p [phentermine], Codeine, and Adhesive [tape]  Family History  Problem Relation Age of Onset  . Graves' disease Mother   . Physical abuse Mother   . Drug abuse Father   . Alcohol abuse Father   . Seizures Father   . Drug abuse Sister   . Anxiety disorder Sister   . Depression Brother   . Anxiety disorder Brother   . Bipolar disorder Brother     Social History Social History   Tobacco Use  . Smoking status: Heavy Tobacco Smoker    Packs/day: 0.50    Types: Cigarettes  . Smokeless tobacco: Never Used  . Tobacco comment: Recently started and reports helps with anxiety.   Substance Use Topics  . Alcohol use: Not Currently    Comment: Occasional drinks with dinner twice a month   . Drug use: No    Review of Systems  Constitutional: Negative for fever. Eyes: Negative for visual changes. ENT: Negative for sore throat. Cardiovascular: Negative for chest pain. Respiratory: Negative for shortness of breath. Gastrointestinal: Negative for abdominal pain, vomiting and diarrhea. Reports dark stool x 1. Denies hematemesis, hematochezia, or rectal pain.  Genitourinary: Negative for dysuria. Musculoskeletal: Negative for back pain. Skin: Negative for rash. Neurological: Negative for headaches, focal weakness or numbness. ____________________________________________  PHYSICAL EXAM:  VITAL SIGNS: ED Triage Vitals  Enc Vitals Group     BP 01/11/19 1536 121/63     Pulse Rate 01/11/19 1536 95     Resp 01/11/19 1536 16     Temp 01/11/19 1536 97.8 F (36.6 C)     Temp Source 01/11/19 1536 Oral     SpO2 01/11/19 1536 98 %     Weight 01/11/19 1537 215 lb (97.5 kg)     Height 01/11/19 1537 5\' 3"  (1.6 m)     Head Circumference --      Peak Flow --      Pain Score 01/11/19 1537 0     Pain Loc --      Pain Edu? --       Excl. in GC? --     Constitutional: Alert and oriented. Well appearing and in no distress. Head: Normocephalic and atraumatic. Eyes: Conjunctivae are normal. Normal extraocular movements Cardiovascular: Normal rate, regular rhythm. Normal distal pulses. Respiratory: Normal respiratory effort. No wheezes/rales/rhonchi. Gastrointestinal: Soft and nontender. No distention. Normal bowel sounds noted. Patient declined rectal exam/guiac.  Musculoskeletal: Nontender with normal range of motion in all extremities.  Neurologic:  Normal gait without ataxia. Normal speech and language. No gross focal neurologic deficits are appreciated. Skin:  Skin is warm, dry and intact. No rash noted. ____________________________________________   LABS (pertinent positives/negatives) Labs Reviewed  COMPREHENSIVE METABOLIC PANEL - Abnormal; Notable for the following components:      Result Value   Potassium 3.2 (*)    Glucose, Bld 106 (*)    All other components  within normal limits  CBC - Abnormal; Notable for the following components:   WBC 13.9 (*)    All other components within normal limits  URINALYSIS, COMPLETE (UACMP) WITH MICROSCOPIC - Abnormal; Notable for the following components:   Color, Urine YELLOW (*)    APPearance CLOUDY (*)    Leukocytes,Ua MODERATE (*)    Bacteria, UA RARE (*)    All other components within normal limits  HCG, QUANTITATIVE, PREGNANCY - Abnormal; Notable for the following components:   hCG, Beta Chain, Quant, S 71,063 (*)    All other components within normal limits  POCT PREGNANCY, URINE - Abnormal; Notable for the following components:   Preg Test, Ur POSITIVE (*)    All other components within normal limits  LIPASE, BLOOD  POC URINE PREG, ED  ____________________________________________  PROCEDURES  Reglan 10 mg PO Procedures ____________________________________________  INITIAL IMPRESSION / ASSESSMENT AND PLAN / ED COURSE  Pregnant patient with ED  evaluation of weakness, nausea, and a dark stool.  Patient exam is overall benign and reassuring at this time.  No complaints of acute abdominal pain, or signs of any acute GI bleeding on exam.  Labs are stable without any signs of anemia.  Patient symptoms likely represent pregnancy related nausea without vomiting.  She is also had decreased intake of solid foods secondary to her extreme nausea.  Patient with out any reports of any acute rectal bleeding, has declined a rectal exam at this time.  She is advised to follow-up with her primary provider for ongoing stool changes.  She verbalizes understanding of her follow-up instructions.  She will be discharged with a prescription for Reglan to take for pregnancy related nausea and vomiting.  She is encouraged to eat carb rich foods to further help prevent nausea and vomiting and to help counter her general weakness and fatigue.  Patient will follow up with [facility] provider as discussed.  Sonya [name] was evaluated in Emergency Department on 01/11/2019 for the symptoms described in the history of present illness. She was evaluated in the context of the global COVID-19 pandemic, which necessitated consideration that the patient might be at risk for infection with the SARS-CoV-2 virus that causes COVID-19. Institutional protocols and algorithms that pertain to the evaluation of patients at risk for COVID-19 are in a state of rapid change based on information released by regulatory bodies including the CDC and federal and state organizations. These policies and algorithms were followed during the patient's care in the ED. ____________________________________________  FINAL CLINICAL IMPRESSION(S) / ED DIAGNOSES  Final diagnoses:  Nausea/vomiting in pregnancy      Melvenia Needles, PA-C 01/11/19 2256    Blake Divine, MD 01/11/19 2312

## 2019-01-24 LAB — OB RESULTS CONSOLE RUBELLA ANTIBODY, IGM: Rubella: IMMUNE

## 2019-01-24 LAB — OB RESULTS CONSOLE VARICELLA ZOSTER ANTIBODY, IGG: Varicella: IMMUNE

## 2019-01-24 LAB — OB RESULTS CONSOLE GC/CHLAMYDIA
Chlamydia: NEGATIVE
Gonorrhea: NEGATIVE

## 2019-01-24 LAB — OB RESULTS CONSOLE HEPATITIS B SURFACE ANTIGEN: Hepatitis B Surface Ag: NEGATIVE

## 2019-01-24 LAB — OB RESULTS CONSOLE RPR: RPR: NONREACTIVE

## 2019-01-28 ENCOUNTER — Other Ambulatory Visit: Payer: Self-pay | Admitting: Certified Nurse Midwife

## 2019-01-28 DIAGNOSIS — H8109 Meniere's disease, unspecified ear: Secondary | ICD-10-CM

## 2019-01-28 DIAGNOSIS — Z3481 Encounter for supervision of other normal pregnancy, first trimester: Secondary | ICD-10-CM

## 2019-01-28 DIAGNOSIS — R768 Other specified abnormal immunological findings in serum: Secondary | ICD-10-CM

## 2019-02-14 ENCOUNTER — Other Ambulatory Visit: Payer: Self-pay

## 2019-02-15 ENCOUNTER — Other Ambulatory Visit: Payer: Self-pay

## 2019-02-15 DIAGNOSIS — Z20822 Contact with and (suspected) exposure to covid-19: Secondary | ICD-10-CM

## 2019-02-16 LAB — NOVEL CORONAVIRUS, NAA: SARS-CoV-2, NAA: NOT DETECTED

## 2019-02-18 ENCOUNTER — Ambulatory Visit: Payer: BLUE CROSS/BLUE SHIELD

## 2019-02-18 ENCOUNTER — Ambulatory Visit: Payer: BLUE CROSS/BLUE SHIELD | Attending: Certified Nurse Midwife

## 2019-02-21 ENCOUNTER — Other Ambulatory Visit: Payer: Self-pay

## 2019-02-21 DIAGNOSIS — Q8719 Other congenital malformation syndromes predominantly associated with short stature: Secondary | ICD-10-CM

## 2019-02-25 ENCOUNTER — Ambulatory Visit
Admission: RE | Admit: 2019-02-25 | Discharge: 2019-02-25 | Disposition: A | Discharge status: Home / Self Care | Payer: BLUE CROSS/BLUE SHIELD | Source: Ambulatory Visit | Attending: Maternal & Fetal Medicine | Admitting: Maternal & Fetal Medicine

## 2019-02-25 ENCOUNTER — Ambulatory Visit (HOSPITAL_BASED_OUTPATIENT_CLINIC_OR_DEPARTMENT_OTHER)
Admission: RE | Admit: 2019-02-25 | Discharge: 2019-02-25 | Disposition: A | Discharge status: Home / Self Care | Payer: BLUE CROSS/BLUE SHIELD | Source: Ambulatory Visit | Attending: Maternal & Fetal Medicine | Admitting: Maternal & Fetal Medicine

## 2019-02-25 ENCOUNTER — Other Ambulatory Visit: Payer: Self-pay | Admitting: Maternal & Fetal Medicine

## 2019-02-25 ENCOUNTER — Other Ambulatory Visit: Payer: Self-pay

## 2019-02-25 DIAGNOSIS — F319 Bipolar disorder, unspecified: Secondary | ICD-10-CM | POA: Insufficient documentation

## 2019-02-25 DIAGNOSIS — Z7982 Long term (current) use of aspirin: Secondary | ICD-10-CM | POA: Diagnosis not present

## 2019-02-25 DIAGNOSIS — Z888 Allergy status to other drugs, medicaments and biological substances status: Secondary | ICD-10-CM | POA: Diagnosis not present

## 2019-02-25 DIAGNOSIS — F419 Anxiety disorder, unspecified: Secondary | ICD-10-CM | POA: Diagnosis not present

## 2019-02-25 DIAGNOSIS — F17210 Nicotine dependence, cigarettes, uncomplicated: Secondary | ICD-10-CM | POA: Insufficient documentation

## 2019-02-25 DIAGNOSIS — Z818 Family history of other mental and behavioral disorders: Secondary | ICD-10-CM | POA: Diagnosis not present

## 2019-02-25 DIAGNOSIS — R000 Tachycardia, unspecified: Secondary | ICD-10-CM | POA: Insufficient documentation

## 2019-02-25 DIAGNOSIS — Z8759 Personal history of other complications of pregnancy, childbirth and the puerperium: Secondary | ICD-10-CM | POA: Insufficient documentation

## 2019-02-25 DIAGNOSIS — Z8249 Family history of ischemic heart disease and other diseases of the circulatory system: Secondary | ICD-10-CM | POA: Diagnosis not present

## 2019-02-25 DIAGNOSIS — O99891 Other specified diseases and conditions complicating pregnancy: Secondary | ICD-10-CM | POA: Diagnosis present

## 2019-02-25 DIAGNOSIS — O99212 Obesity complicating pregnancy, second trimester: Secondary | ICD-10-CM

## 2019-02-25 DIAGNOSIS — Z885 Allergy status to narcotic agent status: Secondary | ICD-10-CM | POA: Diagnosis not present

## 2019-02-25 DIAGNOSIS — R899 Unspecified abnormal finding in specimens from other organs, systems and tissues: Secondary | ICD-10-CM

## 2019-02-25 DIAGNOSIS — Z801 Family history of malignant neoplasm of trachea, bronchus and lung: Secondary | ICD-10-CM | POA: Diagnosis not present

## 2019-02-25 DIAGNOSIS — H8109 Meniere's disease, unspecified ear: Secondary | ICD-10-CM | POA: Diagnosis not present

## 2019-02-25 DIAGNOSIS — O99412 Diseases of the circulatory system complicating pregnancy, second trimester: Secondary | ICD-10-CM | POA: Diagnosis not present

## 2019-02-25 DIAGNOSIS — Z3481 Encounter for supervision of other normal pregnancy, first trimester: Secondary | ICD-10-CM

## 2019-02-25 DIAGNOSIS — E669 Obesity, unspecified: Secondary | ICD-10-CM | POA: Diagnosis not present

## 2019-02-25 DIAGNOSIS — O99210 Obesity complicating pregnancy, unspecified trimester: Secondary | ICD-10-CM | POA: Insufficient documentation

## 2019-02-25 DIAGNOSIS — F331 Major depressive disorder, recurrent, moderate: Secondary | ICD-10-CM

## 2019-02-25 DIAGNOSIS — Z3A14 14 weeks gestation of pregnancy: Secondary | ICD-10-CM

## 2019-02-25 DIAGNOSIS — O99342 Other mental disorders complicating pregnancy, second trimester: Secondary | ICD-10-CM | POA: Diagnosis not present

## 2019-02-25 DIAGNOSIS — Z79899 Other long term (current) drug therapy: Secondary | ICD-10-CM | POA: Insufficient documentation

## 2019-02-25 DIAGNOSIS — O99332 Smoking (tobacco) complicating pregnancy, second trimester: Secondary | ICD-10-CM | POA: Diagnosis not present

## 2019-02-25 DIAGNOSIS — R760 Raised antibody titer: Secondary | ICD-10-CM

## 2019-02-25 DIAGNOSIS — R768 Other specified abnormal immunological findings in serum: Secondary | ICD-10-CM

## 2019-02-25 HISTORY — DX: Bipolar disorder, unspecified: F31.9

## 2019-02-25 NOTE — Progress Notes (Signed)
Current Outpatient Medications on File Prior to Encounter  Medication Sig Dispense Refill  . aspirin EC 81 MG tablet Take 81 mg by mouth daily.    . cholecalciferol (VITAMIN D3) 25 MCG (1000 UT) tablet Take 2,000 Units by mouth daily.    Marland Kitchen lamoTRIgine (LAMICTAL) 100 MG tablet Take 200 mg by mouth daily.    . meclizine (ANTIVERT) 25 MG tablet Take 25 mg by mouth 3 (three) times daily as needed (for vertigo).   0  . Prenatal Vit-Fe Fumarate-FA (PRENATAL MULTIVITAMIN) TABS tablet Take 1 tablet by mouth daily at 12 noon.    . sertraline (ZOLOFT) 50 MG tablet Take 1 tablet (50 mg total) by mouth daily. (Patient taking differently: Take 200 mg by mouth daily. ) 60 tablet 0  . traZODone (DESYREL) 100 MG tablet Take 100 mg by mouth at bedtime.    . VENTOLIN HFA 108 (90 Base) MCG/ACT inhaler Inhale 2 puffs into the lungs every 6 (six) hours as needed for wheezing or shortness of breath.     . vitamin B-12 (CYANOCOBALAMIN) 1000 MCG tablet Take 1,000 mcg by mouth daily.    Marland Kitchen triamterene-hydrochlorothiazide (DYAZIDE) 37.5-25 MG capsule Take 1 capsule by mouth daily.  5  . [DISCONTINUED] carbamazepine (TEGRETOL-XR) 100 MG 12 hr tablet Take 1 tablet (100 mg total) by mouth 2 (two) times daily. 60 tablet 0  . [DISCONTINUED] Dexlansoprazole (DEXILANT) 30 MG capsule Take 30 mg by mouth daily.    . [DISCONTINUED] Ferrous Sulfate (IRON) 28 MG TABS Take 28 mg by mouth daily.    . [DISCONTINUED] fluticasone (FLONASE) 50 MCG/ACT nasal spray Place 1 spray into both nostrils daily.   12  . [DISCONTINUED] pantoprazole (PROTONIX) 40 MG tablet Take 1 tablet (40 mg total) by mouth daily. 30 tablet 1  . [DISCONTINUED] propranolol (INDERAL) 10 MG tablet Take 1 tablet (10 mg total) by mouth 2 (two) times daily as needed. For severe anxiety sx (Patient not taking: Reported on 12/18/2017) 180 tablet 0   No current facility-administered medications on file prior to encounter.

## 2019-02-25 NOTE — Progress Notes (Signed)
Consult Note - [facility]   Date of Visit:  02/25/2019  [facility]  Reason for Consult: Meniere disease, bipolar disorder, history of preeclampsia, SSA (anti-Ro) antibody positive.    History of Present Illness: Sonya [name] is a 28 y.o. female gravida 3 para 1011 at 7415w6d by earliest available CRL which was done at 5942w1d by LMP of 11/13/2018 (as recorded on the report) and was consistent with 342w1d by CRL.  The patient's history is complicated by her Meniere disease, bipolar disorder, history of preeclampsia and SSA (anti-Ro) antibody positive.  Her pregnancy is also complicated by obesity, history of sinus tachycardia, smoking and difficult vaginal delivery.    Meniere disease: She was diagnosed with Meniere's disease in high school after she developed vertigo.  She was hospitalized initially.  She is currently followed by ENT. Her last appt was in July.   She takes meclizine prn.  She has been prescribed Diazide (triamterene and HCTZ) but has not been taking it while she is pregnant.  Her symptoms are well controlled and she rarely takes the meclizine.  Bipolar disorder (also major depression and anxiety) She was hospitalized for 2-4 weeks in 2018.  [facility]  She is currently on Lamictal, Trazodone (an SSRI used to treat insomnia and depression) and Zoloft 100 mg per day (another SSRI).   History of preeclampsia at term Induced at 39 weeks.  Received magnesium sulfate.  Baby did not require NICU.  On low-dose ASA this pregnancy.   SSA (anti-Ro) antibody positive In May, the patient underwent evaluation for swelling of her foot.  She was SSA positive.  She was referred to rheumatology.  She saw Dr. Tresa MooreBehalal-Bock.  SSA was repeated on 07/31/2018 and was:  Sjogren's Anti-SS-A - [facility] 0.0 - 0.9 AI 1.1High    The patient's foot is no longer a problem.       Past Ob History: OB History  Gravida Para Term Preterm AB  Living  3 1 1  0 1 1  SAB TAB Ectopic Molar Multiple Live Births  1 0 0 0 0 1    # Outcome Date GA Lbr Len/2nd Weight Sex Delivery Anes PTL Lv  3 Current           2 Term 12/25/14 363w1d  3.714 kg (8 lb 3 oz) F Vag-Forceps EPI  LIV     Birth Comments: Patient States it was a difficult delivery, next delivery prefers a C-Section     Complications: Perineal laceration during delivery     Name: Jonna ClarkLillie  1 SAB           Induced at 39 week for preeclampsia.  Delivered by forceps after "6 failed attempts at vacuum" with 4th degree perineal laceration.    Past Gyn History: Pap negative 08/08/2017  Past Medical History:  Past Medical History:  Diagnosis Date  . Allergy    Phentermine  . Anemia   . Anxiety   . Bipolar disorder (CMS-HCC)   . Depression   . Encounter for blood transfusion 12/27/14  . GERD (gastroesophageal reflux disease)   . Headache, migraine    random  . Inflammatory arthritis 08/13/2018  . Meniere's disease 07/06/2015    Past Surgical History:  Past Surgical History:  Procedure Laterality Date  . COLONOSCOPY  10/25/2017   Negative colon biopsies/Repeat 9859yrs at age 28/TKT  . EGD  10/25/2017   Gastritis/No Repeat/[facility]  . GANGLION CYST EXCISION  2010  Family History:  Family History  Problem Relation Age of Onset  . Myocardial Infarction (Heart attack) Father   . Alcohol abuse Father   . Bipolar disorder Brother   . Deep vein thrombosis (DVT or abnormal blood clot formation) Maternal Grandmother   . Depression Sister   . Depression Brother   . Thyroid disease Mother        Graves disease  . Arthritis Mother   . Psoriasis Mother   . High blood pressure (Hypertension) Mother   . Lung cancer Maternal Aunt     Social History:  Social History   Tobacco Use  . Smoking status: Current Some Day Smoker    Packs/day: 0.00    Years: 0.00    Pack years: 0.00  . Smokeless tobacco: Never Used  . Tobacco comment: 0-1 Packs Daily  Substance Use Topics  .  Alcohol use: Not Currently    Alcohol/week: 0.0 standard drinks    Comment: Social use     Allergies:  Allergies  Allergen Reactions  . Phentermine Other (See Comments), Palpitations, Shortness Of Breath and Swelling    Tunnel vision, chest pain, headache, and feet became swollen  . Codeine Nausea  . Adhesive Tape-Silicones Rash    Patient prefers paper tape     Medications:   Medication Sig  . aspirin EC 81 MG tablet Take 81 mg by mouth daily.  . cholecalciferol (VITAMIN D3) 25 MCG (1000 UT) tablet Take 2,000 Units by mouth daily.  Marland Kitchen lamoTRIgine (LAMICTAL) 100 MG tablet Take 200 mg by mouth daily.  . meclizine (ANTIVERT) 25 MG tablet Take 25 mg by mouth 3 (three) times daily as needed (for vertigo).   . Prenatal Vit-Fe Fumarate-FA (PRENATAL MULTIVITAMIN) TABS tablet Take 1 tablet by mouth daily at 12 noon.  . sertraline (ZOLOFT) 50 MG tablet Take 1 tablet (50 mg total) by mouth daily. (Patient taking differently: Take 200 mg by mouth daily. )  . traZODone (DESYREL) 100 MG tablet Take 100 mg by mouth at bedtime.  . VENTOLIN HFA 108 (90 Base) MCG/ACT inhaler Inhale 2 puffs into the lungs every 6 (six) hours as needed for wheezing or shortness of breath.   . vitamin B-12 (CYANOCOBALAMIN) 1000 MCG tablet Take 1,000 mcg by mouth daily.  Marland Kitchen triamterene-hydrochlorothiazide (DYAZIDE) 37.5-25 MG capsule Take 1 capsule by mouth daily. (not taking while pregnant_   Review of Systems: Backache, sciatica, nausea, mild cramping.  Otherwise ROS is negative or as above.   OBJECTIVE: Wt 205.5 ;bs T 98.6, BP 116/70. HR 111. R 18. O2 Sat 97%  [place] today - [redacted]w[redacted]d sized fetus at [redacted]w[redacted]d.  FHR 140   ASSESSMENT/RECOMMENDATIONS: 1. Meniere disease - not currently on a diuretic - symptoms controlled  OK to take meclizine 2. Bipolar/depression/anxiety on Lamictal, 2 SSRIs - not taking benzodiazepines  While individual SSRIs and Lamictal may not impose much of a fetal risk, it is hard to know what the  combination may do.  I would recommend a detailed fetal anatomy US at 18 weeks and requested it be done at [facility] would recommend fetal surveillance to include monthly ultrasounds starting at [redacted] weeks gestation, weekly antepartum testing starting at 32-[redacted] weeks gestation and delivery at [redacted] weeks gestation, or sooner if necesary.  3. History of preeclampsia at term - on low-dose ASA  Fetal surveillance as above 4. SSA antibodies - low titer  I have repeated her SSA (anti-Ro) titer today  The patient was counseled about the risks  of neonatal lupus (skin, liver, blood and heart) manifestations with a 3% risk of CHB  It is too late to start Plaquenil to reduce the risk of CHB  If her anti-Ro is positive, it is our practice at [facility] to refer for serial fetal echoes between 16 and 28 weeks.  I have referred her for her first one at [facility] in [place].  If the SSA titer is negative, this referral can be cancelled. 5. Obesity - S/P normal baseline labs  Fetal surveillance as above 6. History of sinus tachycardia - on chronic beta blockade per cardiology.  Last saw them earlier this year. Patient says she has a "leaky valve."  I see no echo.  Fetal surveillance as above 7. Smoking - has been counseled  Fetal surveillance as above 8. Difficult vaginal delivery with 4th degree laceration  Has been offered primary elective cesarean 9. FOB has sister with son with 4p trisomy - not an inherited condition  The patient is not interested in meeting with a Dentist.  I spent 60 minutes in consultation more than half of which was spent counseling the patient and coordinating her care.  I personally performed the service. (TP)  Argentina Ponder, MD

## 2019-02-26 LAB — SJOGRENS SYNDROME-B EXTRACTABLE NUCLEAR ANTIBODY: SSB (La) (ENA) Antibody, IgG: <0.2 AI (ref 0.0–0.9)

## 2019-02-26 LAB — SJOGRENS SYNDROME-A EXTRACTABLE NUCLEAR ANTIBODY: SSA (Ro) (ENA) Antibody, IgG: 1.1 AI — ABNORMAL HIGH (ref 0.0–0.9)

## 2019-03-07 ENCOUNTER — Encounter: Payer: Self-pay | Admitting: Emergency Medicine

## 2019-03-07 ENCOUNTER — Other Ambulatory Visit: Payer: Self-pay

## 2019-03-07 ENCOUNTER — Emergency Department
Admission: EM | Admit: 2019-03-07 | Discharge: 2019-03-07 | Disposition: A | Discharge status: Home / Self Care | Payer: BLUE CROSS/BLUE SHIELD | Attending: Emergency Medicine | Admitting: Emergency Medicine

## 2019-03-07 ENCOUNTER — Emergency Department: Payer: BLUE CROSS/BLUE SHIELD

## 2019-03-07 DIAGNOSIS — Z3A17 17 weeks gestation of pregnancy: Secondary | ICD-10-CM | POA: Diagnosis not present

## 2019-03-07 DIAGNOSIS — I10 Essential (primary) hypertension: Secondary | ICD-10-CM | POA: Insufficient documentation

## 2019-03-07 DIAGNOSIS — O26892 Other specified pregnancy related conditions, second trimester: Secondary | ICD-10-CM | POA: Insufficient documentation

## 2019-03-07 DIAGNOSIS — O26899 Other specified pregnancy related conditions, unspecified trimester: Secondary | ICD-10-CM

## 2019-03-07 DIAGNOSIS — Z79899 Other long term (current) drug therapy: Secondary | ICD-10-CM | POA: Insufficient documentation

## 2019-03-07 DIAGNOSIS — R102 Pelvic and perineal pain: Secondary | ICD-10-CM | POA: Insufficient documentation

## 2019-03-07 LAB — URINALYSIS, COMPLETE (UACMP) WITH MICROSCOPIC
Bacteria, UA: NONE SEEN
Bilirubin Urine: NEGATIVE
Glucose, UA: NEGATIVE mg/dL
Hgb urine dipstick: NEGATIVE
Ketones, ur: NEGATIVE mg/dL
Leukocytes,Ua: NEGATIVE
Nitrite: NEGATIVE
Protein, ur: NEGATIVE mg/dL
Specific Gravity, Urine: 1.016 (ref 1.005–1.030)
pH: 6 (ref 5.0–8.0)

## 2019-03-07 NOTE — ED Triage Notes (Signed)
Pt is SSA antibody positive so gets echo of baby heart every 2 weeks.  Pt feels pressure like prior to delivery with last baby.  Ambulatory. No fever. No bleeding.  Unsure if has felt movement yet in pregnancy.

## 2019-03-07 NOTE — ED Triage Notes (Signed)
FIRST NURSE NOTE- pt c/o pressure in abdomen. Is 17 weeks, G2P1.  No bleeding. Does not feel need to push at this time. Appears uncomfortable.

## 2019-03-07 NOTE — Discharge Instructions (Signed)
Call for a follow up appointment. Rest and hydrate as much as possible until you are evaluated by OB GYN Return to the ER for symptoms that change or worsen or for new concerns if unable to schedule an appointment.

## 2019-03-07 NOTE — ED Notes (Signed)
See triage note   States she is pregnant and is feeling some pressure in lower abd  States the pressure has eased off  Denies any vaginal bleeding

## 2019-03-07 NOTE — ED Provider Notes (Signed)
[facility] Emergency Department Provider Note  ____________________________________________  Time seen: Approximately 7:35 PM  I have reviewed the triage vital signs and the nursing notes.   HISTORY  Chief Complaint abdominal pressure    HPI Sonya [name] is a 28 y.o. female who presents to the emergency department for treatment and evaluation of lower abdomen pressure. She is [redacted] weeks pregnant. She has SSA antibody and has an ultrasound every 2 weeks. No abnormality thus far.  She denies vaginal discharge, gush of fluids, vaginal bleeding, or back pain.   Past Medical History:  Diagnosis Date  . Anxiety   . Bipolar 1 disorder ([place])   . Depression   . Meniere disease UNKNOWN  . Vertigo     Patient Active Problem List   Diagnosis Date Noted  . History of pre-eclampsia 02/25/2019  . Obesity affecting pregnancy 02/25/2019  . Abnormal laboratory test 02/25/2019  . MDD (major depressive disorder), recurrent episode, moderate ([place]) 02/25/2019  . Indication for care in labor and delivery, delivered 12/26/2014  . Indication for care or intervention related to labor and delivery 12/23/2014  . Labor and delivery, indication for care 10/13/2014    Past Surgical History:  Procedure Laterality Date  . COLONOSCOPY WITH PROPOFOL N/A 10/25/2017   Procedure: COLONOSCOPY WITH PROPOFOL;  Surgeon: Toledo, Benay Pike, MD;  Location: [facility] ENDOSCOPY;  Service: Gastroenterology;  Laterality: N/A;  . ESOPHAGOGASTRODUODENOSCOPY (EGD) WITH PROPOFOL N/A 10/25/2017   Procedure: ESOPHAGOGASTRODUODENOSCOPY (EGD) WITH PROPOFOL;  Surgeon: Toledo, Benay Pike, MD;  Location: [facility] ENDOSCOPY;  Service: Gastroenterology;  Laterality: N/A;  . WISDOM TOOTH EXTRACTION    . WRIST SURGERY      Prior to Admission medications   Medication Sig Start Date End Date Taking? Authorizing Provider  aspirin EC 81 MG tablet Take 81 mg by mouth daily.    [provider]  cholecalciferol  (VITAMIN D3) 25 MCG (1000 UT) tablet Take 2,000 Units by mouth daily.    [provider]  lamoTRIgine (LAMICTAL) 100 MG tablet Take 200 mg by mouth daily.    [provider]  meclizine (ANTIVERT) 25 MG tablet Take 25 mg by mouth 3 (three) times daily as needed (for vertigo).  09/08/17   [provider]  Prenatal Vit-Fe Fumarate-FA (PRENATAL MULTIVITAMIN) TABS tablet Take 1 tablet by mouth daily at 12 noon.    [provider]  sertraline (ZOLOFT) 50 MG tablet Take 1 tablet (50 mg total) by mouth daily. Patient taking differently: Take 200 mg by mouth daily.  12/27/17   Derrill Center, NP  traZODone (DESYREL) 100 MG tablet Take 100 mg by mouth at bedtime.    [provider]  triamterene-hydrochlorothiazide (DYAZIDE) 37.5-25 MG capsule Take 1 capsule by mouth daily. 01/18/17   [provider]  VENTOLIN HFA 108 (90 Base) MCG/ACT inhaler Inhale 2 puffs into the lungs every 6 (six) hours as needed for wheezing or shortness of breath.  09/15/17   [provider]  vitamin B-12 (CYANOCOBALAMIN) 1000 MCG tablet Take 1,000 mcg by mouth daily.    [provider]  carbamazepine (TEGRETOL-XR) 100 MG 12 hr tablet Take 1 tablet (100 mg total) by mouth 2 (two) times daily. 12/27/17 01/11/19  Derrill Center, NP  Dexlansoprazole (DEXILANT) 30 MG capsule Take 30 mg by mouth daily.  01/11/19  [provider]  Ferrous Sulfate (IRON) 28 MG TABS Take 28 mg by mouth daily.  01/11/19  [provider]  fluticasone (FLONASE) 50 MCG/ACT nasal  spray Place 1 spray into both nostrils daily.  09/08/17 01/11/19  [provider]  pantoprazole (PROTONIX) 40 MG tablet Take 1 tablet (40 mg total) by mouth daily. 05/17/18 01/11/19  Minna AntisPaduchowski, Kevin, MD  propranolol (INDERAL) 10 MG tablet Take 1 tablet (10 mg total) by mouth 2 (two) times daily as needed. For severe anxiety sx Patient not taking: Reported on 12/18/2017 08/17/17 01/11/19  Jomarie LongsEappen,  Saramma, MD    Allergies Adipex-p [phentermine], Codeine, and Adhesive [tape]  Family History  Problem Relation Age of Onset  . Graves' disease Mother   . Physical abuse Mother   . Drug abuse Father   . Alcohol abuse Father   . Seizures Father   . Drug abuse Sister   . Anxiety disorder Sister   . Depression Brother   . Anxiety disorder Brother   . Bipolar disorder Brother     Social History Social History   Tobacco Use  . Smoking status: Light Tobacco Smoker    Packs/day: 0.20    Types: Cigarettes  . Smokeless tobacco: Never Used  . Tobacco comment: Recently started and reports helps with anxiety.   Substance Use Topics  . Alcohol use: Not Currently    Comment: Occasional drinks with dinner twice a month   . Drug use: No    Review of Systems Constitutional: Negative for fever. Respiratory: Negative for shortness of breath or cough. Gastrointestinal: Negative for abdominal pain; negative for nausea , negative for vomiting. Genitourinary: Negative for dysuria , negative for vaginal discharge. Musculoskeletal: Negative for back pain. Skin: Negative for acute skin changes/rash/lesion. ____________________________________________   PHYSICAL EXAM:  VITAL SIGNS: ED Triage Vitals  Enc Vitals Group     BP 03/07/19 1633 125/73     Pulse Rate 03/07/19 1633 100     Resp 03/07/19 1633 16     Temp 03/07/19 1633 98.2 F (36.8 C)     Temp Source 03/07/19 1633 Oral     SpO2 03/07/19 1633 97 %     Weight 03/07/19 1627 201 lb (91.2 kg)     Height --      Head Circumference --      Peak Flow --      Pain Score 03/07/19 1627 7     Pain Loc --      Pain Edu? --      Excl. in GC? --     Constitutional: Alert and oriented. Well appearing and in no acute distress. Eyes: Conjunctivae are normal. Head: Atraumatic. Nose: No congestion/rhinnorhea. Mouth/Throat: Mucous membranes are moist. Respiratory: Normal respiratory effort.  No retractions. Gastrointestinal: Bowel  sounds active x 4; Abdomen is soft without rebound or guarding. Genitourinary: Pelvic exam: Deferred Musculoskeletal: No extremity tenderness nor edema.  Neurologic:  Normal speech and language. No gross focal neurologic deficits are appreciated. Speech is normal. No gait instability. Skin:  Skin is warm, dry and intact. No rash noted on exposed skin. Psychiatric: Mood and affect are normal. Speech and behavior are normal.  ____________________________________________   LABS (all labs ordered are listed, but only abnormal results are displayed)  Labs Reviewed  URINALYSIS, COMPLETE (UACMP) WITH MICROSCOPIC - Abnormal; Notable for the following components:      Result Value   Color, Urine YELLOW (*)    APPearance CLEAR (*)    All other components within normal limits   ____________________________________________  RADIOLOGY  Ultrasound shows single live IUP with a gestation of 17 weeks 2 days.  Cervix appears closed.  Fetal heart  rate 140.  Fetal movement was observed. ____________________________________________  Procedures  ____________________________________________  Exam and [place] is reassuring. No speculum or bimanual exam performed as the cervix appears closed on [place]. Patient states that the pressure sensation has nearly resolved with rest. She denies pain or cramping.   Consulted with Haroldine Laws, CNM who agrees that patient can follow up in the office next week. Patient was given strict ER return precautions and advised bed rest and pelvic rest until symptoms have completely resolved or she is cleared by gynecology to resume normal ADL.  INITIAL IMPRESSION / ASSESSMENT AND PLAN / ED COURSE  Pertinent labs & imaging results that were available during my care of the patient were reviewed by me and considered in my medical decision making (see chart for details).  ____________________________________________   FINAL CLINICAL IMPRESSION(S) / ED DIAGNOSES  Final diagnoses:   Pelvic pressure in pregnancy    Note:  This document was prepared using Dragon voice recognition software and may include unintentional dictation errors.   Chinita Pester, FNP 03/07/19 2307    Sharman Cheek, MD 03/08/19 [phone]

## 2019-03-21 ENCOUNTER — Other Ambulatory Visit: Payer: Self-pay

## 2019-03-21 DIAGNOSIS — Z3A19 19 weeks gestation of pregnancy: Secondary | ICD-10-CM

## 2019-03-25 ENCOUNTER — Other Ambulatory Visit: Payer: BLUE CROSS/BLUE SHIELD

## 2019-03-28 ENCOUNTER — Ambulatory Visit
Admission: RE | Admit: 2019-03-28 | Discharge: 2019-03-28 | Disposition: A | Discharge status: Home / Self Care | Payer: BLUE CROSS/BLUE SHIELD | Source: Ambulatory Visit | Attending: Obstetrics and Gynecology | Admitting: Obstetrics and Gynecology

## 2019-03-28 ENCOUNTER — Other Ambulatory Visit: Payer: Self-pay

## 2019-03-28 DIAGNOSIS — O99212 Obesity complicating pregnancy, second trimester: Secondary | ICD-10-CM | POA: Diagnosis not present

## 2019-03-28 DIAGNOSIS — E669 Obesity, unspecified: Secondary | ICD-10-CM | POA: Diagnosis not present

## 2019-03-28 DIAGNOSIS — O26892 Other specified pregnancy related conditions, second trimester: Secondary | ICD-10-CM | POA: Diagnosis not present

## 2019-03-28 DIAGNOSIS — H8109 Meniere's disease, unspecified ear: Secondary | ICD-10-CM | POA: Diagnosis not present

## 2019-03-28 DIAGNOSIS — Z3A19 19 weeks gestation of pregnancy: Secondary | ICD-10-CM | POA: Diagnosis not present

## 2019-03-28 DIAGNOSIS — R899 Unspecified abnormal finding in specimens from other organs, systems and tissues: Secondary | ICD-10-CM

## 2019-03-28 DIAGNOSIS — Z8759 Personal history of other complications of pregnancy, childbirth and the puerperium: Secondary | ICD-10-CM | POA: Diagnosis not present

## 2019-04-01 DIAGNOSIS — G4700 Insomnia, unspecified: Secondary | ICD-10-CM | POA: Insufficient documentation

## 2019-04-01 DIAGNOSIS — F17209 Nicotine dependence, unspecified, with unspecified nicotine-induced disorders: Secondary | ICD-10-CM | POA: Insufficient documentation

## 2019-04-09 ENCOUNTER — Other Ambulatory Visit: Payer: Self-pay

## 2019-04-09 ENCOUNTER — Inpatient Hospital Stay: Payer: BLUE CROSS/BLUE SHIELD

## 2019-04-09 ENCOUNTER — Encounter: Payer: Self-pay | Admitting: Obstetrics and Gynecology

## 2019-04-09 ENCOUNTER — Inpatient Hospital Stay
Admission: EM | Admit: 2019-04-09 | Discharge: 2019-04-09 | Disposition: A | Discharge status: Home / Self Care | Payer: BLUE CROSS/BLUE SHIELD | Attending: Certified Nurse Midwife | Admitting: Certified Nurse Midwife

## 2019-04-09 DIAGNOSIS — Z3A21 21 weeks gestation of pregnancy: Secondary | ICD-10-CM | POA: Diagnosis not present

## 2019-04-09 DIAGNOSIS — Z7982 Long term (current) use of aspirin: Secondary | ICD-10-CM | POA: Diagnosis not present

## 2019-04-09 DIAGNOSIS — O99212 Obesity complicating pregnancy, second trimester: Secondary | ICD-10-CM

## 2019-04-09 DIAGNOSIS — O26852 Spotting complicating pregnancy, second trimester: Secondary | ICD-10-CM | POA: Diagnosis present

## 2019-04-09 DIAGNOSIS — R899 Unspecified abnormal finding in specimens from other organs, systems and tissues: Secondary | ICD-10-CM

## 2019-04-09 NOTE — OB Triage Note (Signed)
Patient presents to labor and delivery with complaints of vaginal bleeding and cramping since 1630. She states she noticed a small bit of bleeding and states she had a clot the size of a sesame seed. She states that 30 minutes later she noticed some pink when she used the bathroom. She states she had cramping after the bleeding but that she only feels a cramp every 30 minutes. She states she fell around 03/18/19 and hit her abdomen and was monitored after that fall. She also states she fells this past weekend and fell on her back, She states + FM and VSS.

## 2019-04-09 NOTE — OB Triage Provider Note (Signed)
Sonya [name] is a 29 y.o. female. She is at [redacted]w[redacted]d gestation. Patient's last menstrual period was 11/06/2018 (approximate).  Estimated Date of Delivery: 08/20/19  Prenatal care site: [facility]   Chief complaint: Pt came in after 2 episodes of spotting after voiding this afternoon.  She reports occasional cramping for the last week.  Pt also reports a fall that involved her belly on 1/11 and another fall flat on her back on 1/29.  Denies LOF,  reports active fetal movement.  Pregnancy Complicated by: [facility] recommendations for overall pregnancy:   Serial fetal echos between 16w-28w    Peds cardio recs: neonatal ECG should be performed on prior to hospital discharge   Will follow peds cardio biweekly 28-30 weeks    Anatomy US at 18w (scheduled at [facility])   Q monthly [place] starting at 28w   Weekly antepartum testing starting at 32-36 weeks   Delivery at 39w 1. SS-A Antibody positive   Serial fetal echos between 16w-28w   Fetal echo [redacted]w[redacted]d: normal  2. Prepregnancy BMI 37   CMP, P/C ratio, wnl, A1c: 5.7   Early 1h OGTT: 132 01/28/2019 3. Subchorionic hemorrhage on ultrasound 01/02/2019   01/02/2019: Lt of IUP=1.37 x 0.51 x 1.26cm   01/24/2019: Lt of IUP=1.35 x 1.06 x 1.32cm   Pelvic rest advised 4. FOB with genetic hx   sister with a baby born with 4p Trisomy and brother with Tourettes.   Pt declined genetic testing when [facility] asked 5. Bipolar disorder, hx severe postpartum depression:   Taking Lamictal, Zoloft, and Trazodone    [facility] for management 6. Meniere's disease:   Followed by ENT   Sx well-controlled with Meclizine prn, rare use   Not taking her prescribed diuretic Triamterene-HCTZ during pregnancy  7. Tachycardia:   12.5 metoprolol daily per cardiology for heart rate control 8. Current smoker:   Patient is currently smoking 3-5 cigarettes/day. She is working on cutting down.  9. History of preeclampsia at term with  first baby:   Diagnosed in week 38 and induced.    Diagnosed with severe features postpartum and had course of magnesium.    Baseline CMP and P/C ratio wnl   Advised to start 81mg  aspirin at 12 weeks. 10. Difficult first delivery:   Attempted vacuum assisted delivery-->forceps assisted delivery and 4th degree laceration   Interested in an elective cesarean delivery for this pregnancy  Maternal Medical History:   Past Medical History:  Diagnosis Date  . Anxiety   . Bipolar 1 disorder (HCC)   . Depression   . Meniere disease UNKNOWN  . Vertigo     Past Surgical History:  Procedure Laterality Date  . COLONOSCOPY WITH PROPOFOL N/A 10/25/2017   Procedure: COLONOSCOPY WITH PROPOFOL;  Surgeon: Toledo, 10/27/2017, MD;  Location: [facility] ENDOSCOPY;  Service: Gastroenterology;  Laterality: N/A;  . ESOPHAGOGASTRODUODENOSCOPY (EGD) WITH PROPOFOL N/A 10/25/2017   Procedure: ESOPHAGOGASTRODUODENOSCOPY (EGD) WITH PROPOFOL;  Surgeon: Toledo, 10/27/2017, MD;  Location: [facility] ENDOSCOPY;  Service: Gastroenterology;  Laterality: N/A;  . WISDOM TOOTH EXTRACTION    . WRIST SURGERY      Allergies  Allergen Reactions  . Adipex-P [Phentermine] Shortness Of Breath, Swelling, Palpitations and Hypertension    Tunnel vision, chest pain, headache, and feet became swollen  . Codeine Nausea And Vomiting  . Adhesive [Tape] Rash    Patient prefers paper tape    Prior to Admission medications   Medication Sig Start Date End Date Taking? Authorizing  Provider  aspirin EC 81 MG tablet Take 81 mg by mouth daily.   Yes [provider]  cholecalciferol (VITAMIN D3) 25 MCG (1000 UT) tablet Take 2,000 Units by mouth daily.   Yes [provider]  lamoTRIgine (LAMICTAL) 100 MG tablet Take 200 mg by mouth daily.   Yes [provider]  Prenatal Vit-Fe Fumarate-FA (PRENATAL MULTIVITAMIN) TABS tablet Take 1 tablet by mouth daily at 12 noon.   Yes [provider]  sertraline (ZOLOFT) 50  MG tablet Take 1 tablet (50 mg total) by mouth daily. Patient taking differently: Take 200 mg by mouth daily.  12/27/17  Yes Oneta Rack, NP  traZODone (DESYREL) 100 MG tablet Take 100 mg by mouth at bedtime.   Yes [provider]  vitamin B-12 (CYANOCOBALAMIN) 1000 MCG tablet Take 1,000 mcg by mouth daily.   Yes [provider]  meclizine (ANTIVERT) 25 MG tablet Take 25 mg by mouth 3 (three) times daily as needed (for vertigo).  09/08/17   [provider]  triamterene-hydrochlorothiazide (DYAZIDE) 37.5-25 MG capsule Take 1 capsule by mouth daily. 01/18/17   [provider]  VENTOLIN HFA 108 (90 Base) MCG/ACT inhaler Inhale 2 puffs into the lungs every 6 (six) hours as needed for wheezing or shortness of breath.  09/15/17   [provider]  carbamazepine (TEGRETOL-XR) 100 MG 12 hr tablet Take 1 tablet (100 mg total) by mouth 2 (two) times daily. 12/27/17 01/11/19  Oneta Rack, NP  Dexlansoprazole (DEXILANT) 30 MG capsule Take 30 mg by mouth daily.  01/11/19  [provider]  Ferrous Sulfate (IRON) 28 MG TABS Take 28 mg by mouth daily.  01/11/19  [provider]  fluticasone (FLONASE) 50 MCG/ACT nasal spray Place 1 spray into both nostrils daily.  09/08/17 01/11/19  [provider]  pantoprazole (PROTONIX) 40 MG tablet Take 1 tablet (40 mg total) by mouth daily. 05/17/18 01/11/19  Minna Antis, MD  propranolol (INDERAL) 10 MG tablet Take 1 tablet (10 mg total) by mouth 2 (two) times daily as needed. For severe anxiety sx Patient not taking: Reported on 12/18/2017 08/17/17 01/11/19  Jomarie Longs, MD     Social History: She  reports that she has been smoking cigarettes. She has been smoking about 0.20 packs per day. She has never used smokeless tobacco. She reports previous alcohol use. She reports that she does not use drugs.  Family History: family history includes Alcohol abuse in her father; Anxiety disorder in her  brother and sister; Bipolar disorder in her brother; Depression in her brother; Drug abuse in her father and sister; Luiz Blare' disease in her mother; Physical abuse in her mother; Seizures in her father.   Review of Systems: A full review of systems was performed and negative except as noted in the HPI.     O:  BP 125/73 (BP Location: Left Arm)   Pulse 96   Temp 98.5 F (36.9 C) (Oral)   Resp 16   Ht 5\' 2"  (1.575 m)   Wt 92.5 kg   LMP 11/06/2018 (Approximate)   BMI 37.31 kg/m  No results found for this or any previous visit (from the past 48 hour(s)).   Constitutional: NAD, AAOx3  HE/ENT: extraocular movements grossly intact, moist mucous membranes CV: RRR PULM: nl respiratory effort    Abd: gravid, non-tender, non-distended, soft      Ext: Non-tender, Nonedmeatous   Psych: mood appropriate, speech normal Pelvic deferred  LIMITED OBSTETRIC ULTRASOUND FINDINGS: Number of  Fetuses: 1 Heart Rate:  130 bpm Movement: Yes Presentation: Breech Placental Location: Posterior Previa: No Amniotic Fluid (Subjective):  Within normal limits. BPD: 5.3 cm 22 w  0 d MATERNAL FINDINGS: Cervix: Appears closed. There is a possibility of cervical funneling however noted. Uterus/Adnexae: No abnormality visualized. IMPRESSION: Single live intrauterine pregnancy measuring 22 weeks 0 days.   Assessment: 29 y.o. [redacted]w[redacted]d here for antenatal surveillance after 2 episodes of spotting and occasional cramping  Principle diagnosis: Spotting affecting pregnancy in second trimester  Plan:  C/w Dr. Leafy Ro - Current chief complaint and pregnancy history reviewed  U/s of the cervix - Closed  Labor: not present.   Fetal Wellbeing: Doppler 145bpm  D/c home stable, precautions reviewed, follow-up on 04/15/19 - office to call pt.  ----- Linda Hedges CNM 04/09/19 8:38 PM

## 2019-04-09 NOTE — Discharge Instructions (Signed)
Vaginal Bleeding During Pregnancy, Second Trimester ° °A small amount of bleeding (spotting) from the vagina is relatively common during pregnancy. It usually stops on its own. Various things can cause spotting during pregnancy. Sometimes the bleeding is normal and is not a sign of a problem in the pregnancy. However, bleeding can also be a sign of something serious. Be sure to tell your health care provider about any vaginal bleeding right away. °Some possible causes of vaginal bleeding during the second trimester include: °· Infection, inflammation, or growths (polyps) on the cervix. °· A condition in which the placenta partially or completely covers the opening of the cervix inside the uterus (placenta previa). °· The placenta separating from the uterus (placenta abruption). °· Early (preterm) labor. °· The cervix opening and thinning before pregnancy is at term and before labor starts (cervical insufficiency). °· A mass of tissue developing in the uterus due to an egg being fertilized incorrectly (molar pregnancy). °Follow these instructions at home: °Activity °· Follow instructions from your health care provider about limiting your activity. Ask what activities are safe for you. °· If needed, make plans for someone to help with your regular activities. °· Do not exercise or do activities that take a lot of effort unless your health care provider approves. °· Do not lift anything that is heavier than 10 lb (4.5 kg), or the limit that your health care provider tells you, until he or she says that it is safe. °· Do not have sex or orgasms until your health care provider says that this is safe. °Medicines °· Take over-the-counter and prescription medicines only as told by your health care provider. °· Do not take aspirin because it can cause bleeding. °General instructions °· Pay attention to any changes in your symptoms. °· Write down how many pads you use each day, how often you change pads, and how soaked  (saturated) they are. °· Do not use tampons or douche. °· If you pass any tissue from your vagina, save the tissue so you can show it to your health care provider. °· Keep all follow-up visits as told by your health care provider. This is important. °Contact a health care provider if: °· You have vaginal bleeding during any time of your pregnancy. °· You have cramps or labor pains. °· You have a fever that does not get better when you take medicines. °Get help right away if: °· You have severe cramps in your back or abdomen. °· You have contractions. °· You have chills. °· You pass large clots or a large amount of tissue from your vagina. °· Your bleeding increases. °· You feel light-headed or weak, or you faint. °· You are leaking fluid or have a gush of fluid from your vagina. °Summary °· Various things can cause bleeding or spotting in pregnancy. °· Be sure to tell your health care provider about any vaginal bleeding right away. °· Follow instructions from your health care provider about limiting your activity. Ask what activities are safe for you. °This information is not intended to replace advice given to you by your health care provider. Make sure you discuss any questions you have with your health care provider. °Document Revised: 06/12/2018 Document Reviewed: 05/26/2016 °Elsevier Patient Education © 2020 [employer] Inc[employer] ° °

## 2019-04-09 NOTE — Discharge Summary (Signed)
Patient ID: Sonya [name] MRN: [id number] DOB/AGE: 05/30/1990 29 y.o.  Admit date: 04/09/2019 Discharge date: 04/09/2019  Admission Diagnoses: Spotting affecting pregnancy in second trimester  Discharge Diagnoses: Spotting affecting pregnancy in second trimester  Prenatal Procedures: ultrasound  Consults: none  Significant Diagnostic Studies:  LIMITED OBSTETRIC ULTRASOUND FINDINGS: Number of Fetuses: 1 Heart Rate: 130 bpm Movement: Yes Presentation: Breech Placental Location: Posterior Previa: No Amniotic Fluid (Subjective): Within normal limits. BPD: 5.3 cm 22 w 0 d MATERNAL FINDINGS: Cervix: Appears closed. There is a possibility of cervical funneling however noted. Uterus/Adnexae: No abnormality visualized. IMPRESSION: Single live intrauterine pregnancy measuring 22 weeks 0 days  Treatments: none  Hospital Course:  This is a 29 y.o. G3P1011 with IUP at [redacted]w[redacted]d seen for spotting and cramping in second trimester.  No leaking of fluid.  She was observed, fetal heart rate monitoring remained reassuring, and she had no signs/symptoms of preterm labor or other maternal-fetal concerns. Her u/s showed a closed cervix though they could not r/o funneling.  She was deemed stable for discharge to home with outpatient follow up on 04/15/19 with another u/s.  Discharge Physical Exam:  BP (!) 118/55 (BP Location: Left Arm)   Pulse 91   Temp 98.1 F (36.7 C) (Oral)   Resp 16   Ht 5\' 2"  (1.575 m)   Wt 92.5 kg   LMP 11/06/2018 (Approximate)   BMI 37.31 kg/m   General: NAD CV: RRR Pulm: nl effort ABD: s/nd/nt, gravid DVT Evaluation: LE non-ttp, no evidence of DVT on exam.  FHT: 145bpm TOCO: quiet   Discharge Condition: Stable  Disposition: Discharge disposition: 01-Home or Self Care        Allergies as of 04/09/2019      Reactions   Adipex-p [phentermine] Shortness Of Breath, Swelling, Palpitations, Hypertension   Tunnel vision, chest pain, headache, and feet  became swollen   Codeine Nausea And Vomiting   Adhesive [tape] Rash   Patient prefers paper tape      Medication List    TAKE these medications   aspirin EC 81 MG tablet Take 81 mg by mouth daily.   cholecalciferol 25 MCG (1000 UNIT) tablet Commonly known as: VITAMIN D3 Take 2,000 Units by mouth daily.   lamoTRIgine 100 MG tablet Commonly known as: LAMICTAL Take 200 mg by mouth daily.   meclizine 25 MG tablet Commonly known as: ANTIVERT Take 25 mg by mouth 3 (three) times daily as needed (for vertigo).   prenatal multivitamin Tabs tablet Take 1 tablet by mouth daily at 12 noon.   sertraline 50 MG tablet Commonly known as: Zoloft Take 1 tablet (50 mg total) by mouth daily. What changed: how much to take   traZODone 100 MG tablet Commonly known as: DESYREL Take 100 mg by mouth at bedtime.   triamterene-hydrochlorothiazide 37.5-25 MG capsule Commonly known as: DYAZIDE Take 1 capsule by mouth daily.   Ventolin HFA 108 (90 Base) MCG/ACT inhaler Generic drug: albuterol Inhale 2 puffs into the lungs every 6 (six) hours as needed for wheezing or shortness of breath.   vitamin B-12 1000 MCG tablet Commonly known as: CYANOCOBALAMIN Take 1,000 mcg by mouth daily.      Follow-up Information    12-12-1990, CNM Follow up on 04/15/2019.   Specialty: Certified Nurse Midwife Why: be seen in office on Monday Contact information: [address] [place] [phone]           Signed:  [phone] 04/09/2019 9:56 PM

## 2019-04-09 NOTE — Progress Notes (Signed)
04/09/2019 10:05 PM  BP (!) 118/55 (BP Location: Left Arm)   Pulse 91   Temp 98.1 F (36.7 C) (Oral)   Resp 16   Ht 5\' 2"  (157.5 cm)   Wt 92.5 kg   LMP 11/06/2018 (Approximate)   BMI 37.31 kg/m  Patient discharged per MD orders. Discharge instructions reviewed with patient and patient verbalized understanding. Discharged ambulatory escorted by RN.  01/06/2019, RN

## 2019-06-24 DIAGNOSIS — Z79899 Other long term (current) drug therapy: Secondary | ICD-10-CM | POA: Insufficient documentation

## 2019-07-16 NOTE — H&P (Signed)
Sonya [name] is a 29 y.o. female presenting for elective cesarean section + BTL 08/12/19 for prior 4 th degree laceration  EDC 6/13/21section    pregnancy complicated by  . Cervical Funneling noted at 21 weeks  04/12/19 Cx length 5.93cm with funneling seen with fundal pressure  04/12/19 C/w Dr Feliberto Gottron - Daily vaginal progesterone, and follow-up u/s in 10 days  04/22/19 U/s showed no funneling and cx length = 6.76   [facility] recommendations for overall pregnancy:  Serial fetal echos between 16w-28w-COMPLETED  Peds cardio recs: neonatal ECG should be performed on prior to hospital discharge  Will follow peds cardio biweekly 28-30 weeks  Anatomy US at 18w (scheduled at [facility])  Q monthly [place] starting at 28w  Weekly antepartum testing starting at 32-36 weeks  Delivery at 39w 1. SS-A Antibody positive  Serial fetal echos between 16w-28w  [ x ] Fetal echo [redacted]w[redacted]d: normal  2. Prepregnancy BMI 37  CMP, P/C ratio, wnl, A1c: 5.7  Early 1h OGTT: 132 01/28/2019 3. Subchorionic hemorrhage on ultrasound 01/02/2019  01/02/2019: Lt of IUP=1.37 x 0.51 x 1.26cm  01/24/2019: Lt of IUP=1.35 x 1.06 x 1.32cm  Pelvic rest advised 4. FOB with genetic hx  sister with a baby born with  4p Trisomy and brother with Tourettes.  Pt declined genetic testing when [facility] asked 5. Bipolar disorder, hx severe postpartum depression:  Taking Lamictal, Zoloft, and Trazodone   [facility] for management 6. Meniere's disease:  Followed by ENT  Sx well-controlled with Meclizine prn, rare use  Not taking her prescribed diuretic Triamterene-HCTZ during pregnancy  7. Tachycardia:  12.5 metoprolol daily per cardiology for heart rate control 8. Current smoker:  Patient is currently smoking 3-5 cigarettes/day. She is working on cutting down.  9. History of preeclampsia at term with first baby:  Diagnosed in week 38 and induced.   Diagnosed with severe features postpartum and had  course of magnesium.   Baseline CMP and P/C ratio wnl  Advised to start 81mg  aspirin at 12 weeks. 10. Difficult first delivery:  Attempted vacuum assisted delivery-->forceps assisted delivery and 4th degree laceration  Interested in an elective cesarean delivery for this pregnancy  Primary c/section  + BTL scheduled 08/12/2019 with TJS  OB History    Gravida  3   Para  1   Term  1   Preterm      AB  1   Living  1     SAB  1   TAB      Ectopic      Multiple  0   Live Births  1          Past Medical History:  Diagnosis Date  . Anxiety   . Bipolar 1 disorder (HCC)   . Depression   . Meniere disease UNKNOWN  . Vertigo    Past Surgical History:  Procedure Laterality Date  . COLONOSCOPY WITH PROPOFOL N/A 10/25/2017   Procedure: COLONOSCOPY WITH PROPOFOL;  Surgeon: Toledo, 10/27/2017, MD;  Location: [facility];  Service: Gastroenterology;  Laterality: N/A;  . ESOPHAGOGASTRODUODENOSCOPY (EGD) WITH PROPOFOL N/A 10/25/2017   Procedure: ESOPHAGOGASTRODUODENOSCOPY (EGD) WITH PROPOFOL;  Surgeon: Toledo, 10/27/2017, MD;  Location: [facility];  Service: Gastroenterology;  Laterality: N/A;  . WISDOM TOOTH EXTRACTION    . WRIST SURGERY     Family History: family history includes Alcohol abuse in her father; Anxiety disorder in her brother and sister; Bipolar disorder in her brother; Depression in her brother;  Drug abuse in her father and sister; Berenice Primas' disease in her mother; Physical abuse in her mother; Seizures in her father. Social History:  reports that she has been smoking cigarettes. She has been smoking about 0.20 packs per day. She has never used smokeless tobacco. She reports previous alcohol use. She reports that she does not use drugs.     Maternal Diabetes: No Genetic Screening: Normal Maternal Ultrasounds/Referrals: Normal Fetal Ultrasounds or other Referrals:  Fetal echonl Maternal Substance Abuse:  No Significant Maternal Medications:  Meds include:  Zoloft Other: lamictal  Significant Maternal Lab Results:  None Other Comments:  None  Review of Systems History   Last menstrual period 11/06/2018, unknown if currently breastfeeding. Exam Physical Exam   Lungs CTA   Cv RRR   abd: gravid   Prenatal labs: ABO, Rh:  O+ Antibody:  neg Rubella:  IMM/ Varicella IMM RPR:   NR HBsAg:   Neg HIV:   Neg  GBS:   [ ]    Assessment/Plan: Elective primary LTCS + BTL  for prior 4th degree laceration . 39 +2 weeks    Sonya [name] 07/16/2019, 5:48 PM

## 2019-07-18 LAB — OB RESULTS CONSOLE RPR: RPR: NONREACTIVE

## 2019-07-18 LAB — OB RESULTS CONSOLE GBS: GBS: POSITIVE

## 2019-07-18 LAB — OB RESULTS CONSOLE HIV ANTIBODY (ROUTINE TESTING): HIV: NONREACTIVE

## 2019-07-24 ENCOUNTER — Other Ambulatory Visit: Payer: Self-pay

## 2019-07-24 ENCOUNTER — Encounter: Payer: Self-pay | Admitting: Emergency Medicine

## 2019-07-24 ENCOUNTER — Observation Stay
Admission: EM | Admit: 2019-07-24 | Discharge: 2019-07-24 | Disposition: A | Discharge status: Home / Self Care | Payer: BLUE CROSS/BLUE SHIELD | Attending: Obstetrics and Gynecology | Admitting: Obstetrics and Gynecology

## 2019-07-24 DIAGNOSIS — O99333 Smoking (tobacco) complicating pregnancy, third trimester: Secondary | ICD-10-CM | POA: Diagnosis not present

## 2019-07-24 DIAGNOSIS — J029 Acute pharyngitis, unspecified: Secondary | ICD-10-CM | POA: Diagnosis not present

## 2019-07-24 DIAGNOSIS — O368190 Decreased fetal movements, unspecified trimester, not applicable or unspecified: Secondary | ICD-10-CM | POA: Diagnosis present

## 2019-07-24 DIAGNOSIS — O99343 Other mental disorders complicating pregnancy, third trimester: Secondary | ICD-10-CM | POA: Insufficient documentation

## 2019-07-24 DIAGNOSIS — Z20822 Contact with and (suspected) exposure to covid-19: Secondary | ICD-10-CM | POA: Insufficient documentation

## 2019-07-24 DIAGNOSIS — F419 Anxiety disorder, unspecified: Secondary | ICD-10-CM | POA: Diagnosis not present

## 2019-07-24 DIAGNOSIS — R05 Cough: Secondary | ICD-10-CM | POA: Diagnosis not present

## 2019-07-24 DIAGNOSIS — Z3A36 36 weeks gestation of pregnancy: Secondary | ICD-10-CM | POA: Diagnosis not present

## 2019-07-24 DIAGNOSIS — O368130 Decreased fetal movements, third trimester, not applicable or unspecified: Secondary | ICD-10-CM | POA: Diagnosis present

## 2019-07-24 DIAGNOSIS — F17210 Nicotine dependence, cigarettes, uncomplicated: Secondary | ICD-10-CM | POA: Diagnosis not present

## 2019-07-24 DIAGNOSIS — R0602 Shortness of breath: Secondary | ICD-10-CM | POA: Diagnosis not present

## 2019-07-24 DIAGNOSIS — F319 Bipolar disorder, unspecified: Secondary | ICD-10-CM | POA: Insufficient documentation

## 2019-07-24 DIAGNOSIS — O26893 Other specified pregnancy related conditions, third trimester: Secondary | ICD-10-CM | POA: Insufficient documentation

## 2019-07-24 DIAGNOSIS — Z79899 Other long term (current) drug therapy: Secondary | ICD-10-CM | POA: Insufficient documentation

## 2019-07-24 DIAGNOSIS — R509 Fever, unspecified: Secondary | ICD-10-CM | POA: Insufficient documentation

## 2019-07-24 HISTORY — DX: Dyspnea, unspecified: R06.00

## 2019-07-24 LAB — POC SARS CORONAVIRUS 2 AG: SARS Coronavirus 2 Ag: NEGATIVE

## 2019-07-24 NOTE — ED Triage Notes (Addendum)
Pt presents to ED with Fever for the past 4 days with productive cough, congestion, and sore throat since Tuesday. Pt currently [redacted] weeks pregnant. Followed by [facility] in [place]. COVID test done Monday; results pending.

## 2019-07-24 NOTE — OB Triage Provider Note (Signed)
Triage Visit with NST    Sonya [name] is a 29 y.o. [id number]. She is at [redacted]w[redacted]d gestation. Patient's last menstrual period was 11/06/2018 (approximate). Estimated Date of Delivery: 08/20/19 Prenatal care site: [facility]   Chief Complaint: SOB d/t viral symptoms of cough, sore throat, fever - cleared by ED with a negative rapid Covid swab..  Decreased fetal movement since 0300  Subjective:  Resting comfortably.  Denies CTX, VB, LOF    Past Medical History:  Diagnosis Date  . Anxiety   . Bipolar 1 disorder (HCC)   . Depression   . Dyspnea   . Meniere disease UNKNOWN  . Vertigo     Past Surgical History:  Procedure Laterality Date  . COLONOSCOPY WITH PROPOFOL N/A 10/25/2017   Procedure: COLONOSCOPY WITH PROPOFOL;  Surgeon: Toledo, Boykin Nearing, MD;  Location: [facility] ENDOSCOPY;  Service: Gastroenterology;  Laterality: N/A;  . ESOPHAGOGASTRODUODENOSCOPY (EGD) WITH PROPOFOL N/A 10/25/2017   Procedure: ESOPHAGOGASTRODUODENOSCOPY (EGD) WITH PROPOFOL;  Surgeon: Toledo, Boykin Nearing, MD;  Location: [facility] ENDOSCOPY;  Service: Gastroenterology;  Laterality: N/A;  . WISDOM TOOTH EXTRACTION    . WRIST SURGERY      Allergies  Allergen Reactions  . Adipex-P [Phentermine] Shortness Of Breath, Swelling, Palpitations and Hypertension    Tunnel vision, chest pain, headache, and feet became swollen  . Codeine Nausea And Vomiting  . Adhesive [Tape] Rash    Patient prefers paper tape    Prior to Admission medications   Medication Sig Start Date End Date Taking? Authorizing Provider  aspirin EC 81 MG tablet Take 81 mg by mouth daily.   Yes [provider]  cholecalciferol (VITAMIN D3) 25 MCG (1000 UT) tablet Take 2,000 Units by mouth daily.   Yes [provider]  ferrous sulfate 324 MG TBEC Take 324 mg by mouth.   Yes [provider]  lamoTRIgine (LAMICTAL) 100 MG tablet Take 200 mg by mouth daily.   Yes [provider]  meclizine (ANTIVERT) 25 MG tablet  Take 25 mg by mouth 3 (three) times daily as needed (for vertigo).  09/08/17  Yes [provider]  Prenatal Vit-Fe Fumarate-FA (PRENATAL MULTIVITAMIN) TABS tablet Take 1 tablet by mouth daily at 12 noon.   Yes [provider]  sertraline (ZOLOFT) 50 MG tablet Take 1 tablet (50 mg total) by mouth daily. Patient taking differently: Take 200 mg by mouth daily.  12/27/17  Yes Oneta Rack, NP  traZODone (DESYREL) 100 MG tablet Take 100 mg by mouth at bedtime.   Yes [provider]  vitamin B-12 (CYANOCOBALAMIN) 1000 MCG tablet Take 1,000 mcg by mouth daily.   Yes [provider]  triamterene-hydrochlorothiazide (DYAZIDE) 37.5-25 MG capsule Take 1 capsule by mouth daily. 01/18/17   [provider]  VENTOLIN HFA 108 (90 Base) MCG/ACT inhaler Inhale 2 puffs into the lungs every 6 (six) hours as needed for wheezing or shortness of breath.  09/15/17   [provider]  carbamazepine (TEGRETOL-XR) 100 MG 12 hr tablet Take 1 tablet (100 mg total) by mouth 2 (two) times daily. 12/27/17 01/11/19  Oneta Rack, NP  Dexlansoprazole (DEXILANT) 30 MG capsule Take 30 mg by mouth daily.  01/11/19  [provider]  fluticasone (FLONASE) 50 MCG/ACT nasal spray Place 1 spray into both nostrils daily.  09/08/17 01/11/19  [provider]  pantoprazole (PROTONIX) 40 MG tablet Take 1 tablet (40 mg total) by mouth daily. 05/17/18 01/11/19  Minna Antis, MD  propranolol (INDERAL) 10 MG  tablet Take 1 tablet (10 mg total) by mouth 2 (two) times daily as needed. For severe anxiety sx Patient not taking: Reported on 12/18/2017 08/17/17 01/11/19  Ursula Alert, MD     Social History:  reports that she has been smoking cigarettes. She has been smoking about 0.20 packs per day. She has never used smokeless tobacco. She reports previous alcohol use. She reports that she does not use drugs.  Family History: family history includes Alcohol abuse in her father;  Anxiety disorder in her brother and sister; Bipolar disorder in her brother; Depression in her brother; Drug abuse in her father and sister; Berenice Primas' disease in her mother; Physical abuse in her mother; Seizures in her father.   Review of Systems: A full review of systems was performed and negative except as noted in the HPI.    Objective:  BP 124/63 (BP Location: Left Arm)   Pulse (!) 106   Temp 98.5 F (36.9 C) (Oral)   Resp 18   Ht 5\' 3"  (1.6 m)   Wt 95.3 kg   LMP 11/06/2018 (Approximate)   SpO2 96%   BMI 37.20 kg/m  Results for orders placed or performed during the hospital encounter of 07/24/19 (from the past 48 hour(s))  POC SARS Coronavirus 2 Ag   Collection Time: 07/24/19  4:45 AM  Result Value Ref Range   SARS Coronavirus 2 Ag NEGATIVE NEGATIVE      Gen: NAD, AAOx3      Abd: FNTTP      Ext: Non-tender, Nonedmeatous     NST: FHR baseline: 125 bpm Variability: moderate Accelerations: yes Decelerations: none Time: 1.5 hours Category/reactivity: reactive  TOCO: occasional   Assessment:   29 y.o. G3P1011 [redacted]w[redacted]d with concerns for decreased fetal movement, now resolved.   Principle Diagnosis:  Decreased fetal movement  Plan:   Labor: not present.   Fetal Wellbeing: NST is reassuring, reactive tracing   D/c home stable, precautions reviewed, follow-up as scheduled.     - Kick counts reviewed with the patient in detail.  Patient instructed to assess for fetal activity daily at regular intervals. Counts to be done if decreased activity noted. Patient instructed to contact the office if counts do not reveal adequate movements.    Linda Hedges, CNM 07/24/2019 6:07 AM

## 2019-07-24 NOTE — OB Triage Note (Signed)
Pt arrived from home with complaints of SOB, congestion and cough and a fever for the past 6 days. Pt reports daughter was sick and tested negative for covid. Pt was tested on Monday and hasn't received her results. Pt denies any leaking of fluid or vaginal bleeding. Pt reports a decrease in FM.

## 2019-07-24 NOTE — ED Notes (Signed)
Obs 4 

## 2019-07-24 NOTE — Progress Notes (Signed)
CNM notified of pt arrival and complaint. CNM aware that pt c/o of SOB, O2 saturations 97-98% on room air. CNM notified of wet intermittent cough and recent COVID test. Orders received to continue to  monitor baby until reactive NST. CNM to come evaluate the pt at the bedside.

## 2019-07-27 ENCOUNTER — Other Ambulatory Visit: Payer: Self-pay

## 2019-07-27 ENCOUNTER — Observation Stay
Admission: EM | Admit: 2019-07-27 | Discharge: 2019-07-27 | Disposition: A | Discharge status: Home / Self Care | Payer: BLUE CROSS/BLUE SHIELD | Attending: Obstetrics and Gynecology | Admitting: Obstetrics and Gynecology

## 2019-07-27 DIAGNOSIS — Z8349 Family history of other endocrine, nutritional and metabolic diseases: Secondary | ICD-10-CM | POA: Insufficient documentation

## 2019-07-27 DIAGNOSIS — Z888 Allergy status to other drugs, medicaments and biological substances status: Secondary | ICD-10-CM | POA: Insufficient documentation

## 2019-07-27 DIAGNOSIS — O99413 Diseases of the circulatory system complicating pregnancy, third trimester: Secondary | ICD-10-CM | POA: Insufficient documentation

## 2019-07-27 DIAGNOSIS — F17210 Nicotine dependence, cigarettes, uncomplicated: Secondary | ICD-10-CM | POA: Insufficient documentation

## 2019-07-27 DIAGNOSIS — Z3A36 36 weeks gestation of pregnancy: Secondary | ICD-10-CM | POA: Insufficient documentation

## 2019-07-27 DIAGNOSIS — Z818 Family history of other mental and behavioral disorders: Secondary | ICD-10-CM | POA: Insufficient documentation

## 2019-07-27 DIAGNOSIS — O99333 Smoking (tobacco) complicating pregnancy, third trimester: Secondary | ICD-10-CM | POA: Insufficient documentation

## 2019-07-27 DIAGNOSIS — O368130 Decreased fetal movements, third trimester, not applicable or unspecified: Secondary | ICD-10-CM

## 2019-07-27 DIAGNOSIS — Z885 Allergy status to narcotic agent status: Secondary | ICD-10-CM | POA: Diagnosis not present

## 2019-07-27 DIAGNOSIS — Z79899 Other long term (current) drug therapy: Secondary | ICD-10-CM | POA: Insufficient documentation

## 2019-07-27 DIAGNOSIS — O99343 Other mental disorders complicating pregnancy, third trimester: Secondary | ICD-10-CM | POA: Insufficient documentation

## 2019-07-27 DIAGNOSIS — O99353 Diseases of the nervous system complicating pregnancy, third trimester: Secondary | ICD-10-CM | POA: Insufficient documentation

## 2019-07-27 DIAGNOSIS — F319 Bipolar disorder, unspecified: Secondary | ICD-10-CM | POA: Diagnosis not present

## 2019-07-27 DIAGNOSIS — R899 Unspecified abnormal finding in specimens from other organs, systems and tissues: Secondary | ICD-10-CM

## 2019-07-27 DIAGNOSIS — O26852 Spotting complicating pregnancy, second trimester: Secondary | ICD-10-CM

## 2019-07-27 DIAGNOSIS — H8109 Meniere's disease, unspecified ear: Secondary | ICD-10-CM | POA: Insufficient documentation

## 2019-07-27 DIAGNOSIS — O42913 Preterm premature rupture of membranes, unspecified as to length of time between rupture and onset of labor, third trimester: Secondary | ICD-10-CM | POA: Diagnosis present

## 2019-07-27 DIAGNOSIS — Z7982 Long term (current) use of aspirin: Secondary | ICD-10-CM | POA: Diagnosis not present

## 2019-07-27 DIAGNOSIS — O99212 Obesity complicating pregnancy, second trimester: Secondary | ICD-10-CM

## 2019-07-27 DIAGNOSIS — F419 Anxiety disorder, unspecified: Secondary | ICD-10-CM | POA: Diagnosis not present

## 2019-07-27 DIAGNOSIS — Z0371 Encounter for suspected problem with amniotic cavity and membrane ruled out: Secondary | ICD-10-CM | POA: Diagnosis present

## 2019-07-27 LAB — RUPTURE OF MEMBRANE (ROM)PLUS: Rom Plus: NEGATIVE

## 2019-07-27 NOTE — Discharge Summary (Signed)
Sonya [name] is a 29 y.o. female. She is at [redacted]w[redacted]d gestation. Patient's last menstrual period was 11/06/2018 (approximate). Estimated Date of Delivery: 08/20/19  Prenatal care site: [facility]  Current pregnancy complicated by: 1. Cervical Funneling noted at 21 weeks  04/12/19 Cx length 5.93cm with funneling seen with fundal pressure  04/12/19 C/w Dr Feliberto Gottron - Daily vaginal progesterone, and follow-up u/s in 10 days  04/22/19 U/s showed no funneling and cx length = 6.76   2. [facility] recommendations for overall pregnancy:  Serial fetal echos between 16w-28w-COMPLETED  Peds cardio recs: neonatal ECG should be performed on prior to hospital discharge  Will follow peds cardio biweekly 28-30 weeks  Anatomy US at 18w (scheduled at [facility])  Q monthly [place] starting at 28w  Weekly antepartum testing starting at 32-36 weeks  Delivery at 39w 2. SS-A Antibody positive  Serial fetal echos between 16w-28w  [ x ] Fetal echo [redacted]w[redacted]d: normal  3. Prepregnancy BMI 37  CMP, P/C ratio, wnl, A1c: 5.7  Early 1h OGTT: 132 01/28/2019 4. Subchorionic hemorrhage on ultrasound 01/02/2019  01/02/2019: Lt of IUP=1.37 x 0.51 x 1.26cm  01/24/2019: Lt of IUP=1.35 x 1.06 x 1.32cm  Pelvic rest advised 5. FOB with genetic hx  sister with a baby born with 4p Trisomy and brother with Tourettes.  Pt declined genetic testing when [facility] asked 6. Bipolar disorder, hx severe postpartum depression:  Taking Lamictal, Zoloft, and Trazodone   [facility] for management 7. Meniere's disease:  Followed by ENT  Sx well-controlled with Meclizine prn, rare use  Not taking her prescribed diuretic Triamterene-HCTZ during pregnancy  8. Tachycardia:  12.5 metoprolol daily per cardiology for heart rate control 9.  Current smoker:  Patient is currently smoking 3-5 cigarettes/day. She is working on cutting down.  10.  History of preeclampsia at term with first baby:  Diagnosed in  week 38 and induced.   Diagnosed with severe features postpartum and had course of magnesium.   Baseline CMP and P/C ratio wnl  Advised to start 81mg  aspirin at 12 weeks. 11.  Difficult first delivery:  Attempted vacuum assisted delivery-->forceps assisted delivery and 4th degree laceration  Interested in an elective cesarean delivery for this pregnancy  Primary c/section + BTL scheduled 08/12/2019 with TJS   Chief complaint: some thin odorless leaking of fluid since this morning.   Aggravating or alleviating conditions: feeling small amount of contractions intermittently, mild. Denies VB   S: Resting comfortably. no CTX, no VB.no LOF,  Active fetal movement. Denies: HA, visual changes, SOB, or RUQ/epigastric pain  Maternal Medical History:   Past Medical History:  Diagnosis Date  . Anxiety   . Bipolar 1 disorder (HCC)   . Depression   . Dyspnea   . Meniere disease UNKNOWN  . Vertigo     Past Surgical History:  Procedure Laterality Date  . COLONOSCOPY WITH PROPOFOL N/A 10/25/2017   Procedure: COLONOSCOPY WITH PROPOFOL;  Surgeon: Toledo, 10/27/2017, MD;  Location: [facility] ENDOSCOPY;  Service: Gastroenterology;  Laterality: N/A;  . ESOPHAGOGASTRODUODENOSCOPY (EGD) WITH PROPOFOL N/A 10/25/2017   Procedure: ESOPHAGOGASTRODUODENOSCOPY (EGD) WITH PROPOFOL;  Surgeon: Toledo, 10/27/2017, MD;  Location: [facility] END[facility]COPY;  Service: Gastroenterology;  Laterality: N/A;  . WISDOM TOOTH EXTRACTION    . WRIST SURGERY      Allergies  Allergen Reactions  . Adipex-P [Phentermine] Shortness Of Breath, Swelling, Palpitations and Hypertension    Tunnel vision, chest pain, headache, and feet became swollen  . Codeine Nausea  And Vomiting  . Adhesive [Tape] Rash    Patient prefers paper tape    Prior to Admission medications   Medication Sig Start Date End Date Taking? Authorizing Provider  acetaminophen (TYLENOL) 500 MG tablet Take 1,000 mg by mouth every 6 (six) hours as needed for moderate  pain or headache.    [provider]  alum & mag hydroxide-simeth (MAALOX ADVANCED MAX ST) 400-400-40 MG/5ML suspension Take 10 mLs by mouth 4 (four) times daily as needed for indigestion.    [provider]  amoxicillin (AMOXIL) 250 MG capsule Take 250 mg by mouth 3 (three) times daily.    [provider]  aspirin EC 81 MG tablet Take 81 mg by mouth at bedtime.     [provider]  calcium carbonate (TUMS - DOSED IN MG ELEMENTAL CALCIUM) 500 MG chewable tablet Chew 2-3 tablets by mouth 4 (four) times daily as needed for indigestion or heartburn.    [provider]  Cholecalciferol (VITAMIN D) 50 MCG (2000 UT) tablet Take 2,000 Units by mouth at bedtime.    [provider]  ferrous gluconate (IRON 27) 240 (27 FE) MG tablet Take 240 mg by mouth at bedtime.    [provider]  lamoTRIgine (LAMICTAL) 100 MG tablet Take 200 mg by mouth at bedtime.     [provider]  meclizine (ANTIVERT) 25 MG tablet Take 25 mg by mouth 3 (three) times daily as needed (for vertigo).  09/08/17   [provider]  metoprolol succinate (TOPROL-XL) 25 MG 24 hr tablet Take 12.5 mg by mouth at bedtime.    [provider]  ondansetron (ZOFRAN-ODT) 4 MG disintegrating tablet Take 4 mg by mouth every 8 (eight) hours as needed for nausea/vomiting. 05/07/19   [provider]  Prenatal Vit-Fe Fumarate-FA (PRENATAL MULTIVITAMIN) TABS tablet Take 1 tablet by mouth at bedtime.     [provider]  sertraline (ZOLOFT) 100 MG tablet Take 200 mg by mouth at bedtime.    [provider]  traZODone (DESYREL) 100 MG tablet Take 100 mg by mouth at bedtime.    [provider]  triamcinolone cream (KENALOG) 0.1 % Apply 1 application topically daily as needed (eczema).  05/07/19   [provider]  vitamin B-12 (CYANOCOBALAMIN) 1000 MCG tablet Take 1,000 mcg by mouth at bedtime.     [provider]   carbamazepine (TEGRETOL-XR) 100 MG 12 hr tablet Take 1 tablet (100 mg total) by mouth 2 (two) times daily. 12/27/17 01/11/19  Oneta Rack, NP  Dexlansoprazole (DEXILANT) 30 MG capsule Take 30 mg by mouth daily.  01/11/19  [provider]  fluticasone (FLONASE) 50 MCG/ACT nasal spray Place 1 spray into both nostrils daily.  09/08/17 01/11/19  [provider]  pantoprazole (PROTONIX) 40 MG tablet Take 1 tablet (40 mg total) by mouth daily. 05/17/18 01/11/19  Minna Antis, MD  propranolol (INDERAL) 10 MG tablet Take 1 tablet (10 mg total) by mouth 2 (two) times daily as needed. For severe anxiety sx Patient not taking: Reported on 12/18/2017 08/17/17 01/11/19  Jomarie Longs, MD      Social History: She  reports that she has been smoking cigarettes. She has been smoking about 0.20 packs per day. She has never used smokeless tobacco. She reports previous alcohol use. She reports that she does not use drugs.  Family History: family history includes Alcohol abuse in her father; Anxiety disorder in her brother and sister; Bipolar disorder in her brother;  Depression in her brother; Drug abuse in her father and sister; Berenice Primas' disease in her mother; Physical abuse in her mother; Seizures in her father.   Review of Systems: A full review of systems was performed and negative except as noted in the HPI.     O:  BP 127/67   Pulse (!) 105   Temp 98.6 F (37 C) (Oral)   Resp 14   LMP 11/06/2018 (Approximate)  Results for orders placed or performed during the hospital encounter of 07/27/19 (from the past 48 hour(s))  ROM Plus ([facility] only)   Collection Time: 07/27/19  3:52 PM  Result Value Ref Range   Rom Plus NEGATIVE      Fetal  monitoring: Cat I Appropriate for GA Baseline: 130bpm Variability: moderate Accelerations:  present x >2 Decelerations absent Time 52mins   A/P: 29 y.o. [redacted]w[redacted]d here for antenatal surveillance for suspected ROM  Principle Diagnosis:  Suspected  ROM not found   Labor: not present.   Fetal Wellbeing: Reassuring Cat 1 tracing with reactive NST   Neg ROM Plus.   D/c home stable, precautions reviewed, follow-up as scheduled in office this week with Dr Kathaleen Maser.     Francetta Found, CNM 07/27/2019  5:09 PM

## 2019-07-27 NOTE — OB Triage Note (Signed)
Pt reports some thin odorless leaking of fluid since this morning.

## 2019-08-01 ENCOUNTER — Encounter: Payer: Self-pay | Admitting: Obstetrics and Gynecology

## 2019-08-01 ENCOUNTER — Observation Stay
Admission: EM | Admit: 2019-08-01 | Discharge: 2019-08-02 | Disposition: A | Discharge status: Home / Self Care | Payer: BLUE CROSS/BLUE SHIELD

## 2019-08-01 ENCOUNTER — Other Ambulatory Visit: Payer: Self-pay

## 2019-08-01 DIAGNOSIS — Z3A37 37 weeks gestation of pregnancy: Secondary | ICD-10-CM | POA: Diagnosis not present

## 2019-08-01 DIAGNOSIS — F419 Anxiety disorder, unspecified: Secondary | ICD-10-CM | POA: Insufficient documentation

## 2019-08-01 DIAGNOSIS — F17210 Nicotine dependence, cigarettes, uncomplicated: Secondary | ICD-10-CM | POA: Diagnosis not present

## 2019-08-01 DIAGNOSIS — F319 Bipolar disorder, unspecified: Secondary | ICD-10-CM | POA: Diagnosis not present

## 2019-08-01 DIAGNOSIS — Z79899 Other long term (current) drug therapy: Secondary | ICD-10-CM | POA: Insufficient documentation

## 2019-08-01 DIAGNOSIS — O479 False labor, unspecified: Secondary | ICD-10-CM | POA: Diagnosis present

## 2019-08-01 DIAGNOSIS — O99343 Other mental disorders complicating pregnancy, third trimester: Secondary | ICD-10-CM | POA: Diagnosis not present

## 2019-08-01 DIAGNOSIS — O471 False labor at or after 37 completed weeks of gestation: Principal | ICD-10-CM | POA: Insufficient documentation

## 2019-08-01 DIAGNOSIS — O99212 Obesity complicating pregnancy, second trimester: Secondary | ICD-10-CM

## 2019-08-01 DIAGNOSIS — O26852 Spotting complicating pregnancy, second trimester: Secondary | ICD-10-CM

## 2019-08-01 DIAGNOSIS — O368130 Decreased fetal movements, third trimester, not applicable or unspecified: Secondary | ICD-10-CM

## 2019-08-01 DIAGNOSIS — R899 Unspecified abnormal finding in specimens from other organs, systems and tissues: Secondary | ICD-10-CM

## 2019-08-01 LAB — URINALYSIS, COMPLETE (UACMP) WITH MICROSCOPIC
Bilirubin Urine: NEGATIVE
Glucose, UA: NEGATIVE mg/dL
Hgb urine dipstick: NEGATIVE
Ketones, ur: NEGATIVE mg/dL
Leukocytes,Ua: NEGATIVE
Nitrite: NEGATIVE
Protein, ur: NEGATIVE mg/dL
Specific Gravity, Urine: 1.014 (ref 1.005–1.030)
pH: 7 (ref 5.0–8.0)

## 2019-08-01 MED ORDER — ACETAMINOPHEN 325 MG PO TABS
650.0000 mg | ORAL_TABLET | ORAL | Status: DC | PRN
  Administered 2019-08-01: 650 mg via ORAL
  Filled 2019-08-01: qty 2

## 2019-08-01 MED ORDER — CALCIUM CARBONATE ANTACID 500 MG PO CHEW: 2.0000 | CHEWABLE_TABLET | ORAL | Status: DC | PRN

## 2019-08-01 NOTE — Discharge Summary (Signed)
Sonya [name] is a 29 y.o. female. She is at [redacted]w[redacted]d gestation. Patient's last menstrual period was 11/06/2018 (approximate). Estimated Date of Delivery: 08/20/19  Prenatal care site: [facility]  Chief complaint: contractions   Reports contractions that started earlier today and have started to feel more frequent.  Contractions feel mild but more uncomfortable than what she was feeling earlier.    S: Resting comfortably, no VB.no LOF,  Active fetal movement.   Maternal Medical History:  Past Medical Hx:  has a past medical history of Anxiety, Bipolar 1 disorder (HCC), Depression, Dyspnea, Meniere disease (UNKNOWN), and Vertigo.    Past Surgical Hx:  has a past surgical history that includes Wrist surgery; Wisdom tooth extraction; Esophagogastroduodenoscopy (egd) with propofol (N/A, 10/25/2017); and Colonoscopy with propofol (N/A, 10/25/2017).   Allergies  Allergen Reactions  . Adipex-P [Phentermine] Shortness Of Breath, Swelling, Palpitations and Hypertension    Tunnel vision, chest pain, headache, and feet became swollen  . Codeine Nausea And Vomiting  . Adhesive [Tape] Rash    Patient prefers paper tape     Prior to Admission medications   Medication Sig Start Date End Date Taking? Authorizing Provider  acetaminophen (TYLENOL) 500 MG tablet Take 1,000 mg by mouth every 6 (six) hours as needed for moderate pain or headache.   Yes [provider]  alum & mag hydroxide-simeth (MAALOX ADVANCED MAX ST) 400-400-40 MG/5ML suspension Take 10 mLs by mouth 4 (four) times daily as needed for indigestion.   Yes [provider]  amoxicillin (AMOXIL) 250 MG capsule Take 250 mg by mouth 3 (three) times daily.   Yes [provider]  aspirin EC 81 MG tablet Take 81 mg by mouth at bedtime.    Yes [provider]  calcium carbonate (TUMS - DOSED IN MG ELEMENTAL CALCIUM) 500 MG chewable tablet Chew 2-3 tablets by mouth 4 (four) times daily as needed for  indigestion or heartburn.   Yes [provider]  Cholecalciferol (VITAMIN D) 50 MCG (2000 UT) tablet Take 2,000 Units by mouth at bedtime.   Yes [provider]  ferrous gluconate (IRON 27) 240 (27 FE) MG tablet Take 240 mg by mouth at bedtime.   Yes [provider]  lamoTRIgine (LAMICTAL) 100 MG tablet Take 200 mg by mouth at bedtime.    Yes [provider]  meclizine (ANTIVERT) 25 MG tablet Take 25 mg by mouth 3 (three) times daily as needed (for vertigo).  09/08/17  Yes [provider]  metoprolol succinate (TOPROL-XL) 25 MG 24 hr tablet Take 12.5 mg by mouth at bedtime.   Yes [provider]  ondansetron (ZOFRAN-ODT) 4 MG disintegrating tablet Take 4 mg by mouth every 8 (eight) hours as needed for nausea/vomiting. 05/07/19  Yes [provider]  Prenatal Vit-Fe Fumarate-FA (PRENATAL MULTIVITAMIN) TABS tablet Take 1 tablet by mouth at bedtime.    Yes [provider]  sertraline (ZOLOFT) 100 MG tablet Take 200 mg by mouth at bedtime.   Yes [provider]  traZODone (DESYREL) 100 MG tablet Take 100 mg by mouth at bedtime.   Yes [provider]  triamcinolone cream (KENALOG) 0.1 % Apply 1 application topically daily as needed (eczema).  05/07/19  Yes [provider]  vitamin B-12 (CYANOCOBALAMIN) 1000 MCG tablet Take 1,000 mcg by mouth at bedtime.    Yes [provider]  carbamazepine (TEGRETOL-XR) 100 MG 12 hr tablet Take 1 tablet (100 mg total) by mouth 2 (two) times daily. 12/27/17  01/11/19  Derrill Center, NP  Dexlansoprazole (DEXILANT) 30 MG capsule Take 30 mg by mouth daily.  01/11/19  [provider]  fluticasone (FLONASE) 50 MCG/ACT nasal spray Place 1 spray into both nostrils daily.  09/08/17 01/11/19  [provider]  pantoprazole (PROTONIX) 40 MG tablet Take 1 tablet (40 mg total) by mouth daily. 05/17/18 01/11/19  Harvest Dark, MD  propranolol (INDERAL) 10 MG tablet Take  1 tablet (10 mg total) by mouth 2 (two) times daily as needed. For severe anxiety sx Patient not taking: Reported on 12/18/2017 08/17/17 01/11/19  Ursula Alert, MD    Social History: She  reports that she has been smoking cigarettes. She has been smoking about 0.20 packs per day. She has never used smokeless tobacco. She reports previous alcohol use. She reports that she does not use drugs.  Family History: family history includes Alcohol abuse in her father; Anxiety disorder in her brother and sister; Bipolar disorder in her brother; Depression in her brother; Drug abuse in her father and sister; Berenice Primas' disease in her mother; Physical abuse in her mother; Seizures in her father.   Review of Systems: A full review of systems was performed and negative except as noted in the HPI.    O:  BP 127/73 (BP Location: Left Arm)   Pulse 100   Temp 98.9 F (37.2 C) (Oral)   Resp 16   Ht 5\' 3"  (1.6 m)   Wt 97.1 kg   LMP 11/06/2018 (Approximate)   BMI 37.91 kg/m  Results for orders placed or performed during the hospital encounter of 08/01/19 (from the past 48 hour(s))  Urinalysis, Complete w Microscopic   Collection Time: 08/01/19  9:56 PM  Result Value Ref Range   Color, Urine YELLOW (A) YELLOW   APPearance HAZY (A) CLEAR   Specific Gravity, Urine 1.014 1.005 - 1.030   pH 7.0 5.0 - 8.0   Glucose, UA NEGATIVE NEGATIVE mg/dL   Hgb urine dipstick NEGATIVE NEGATIVE   Bilirubin Urine NEGATIVE NEGATIVE   Ketones, ur NEGATIVE NEGATIVE mg/dL   Protein, ur NEGATIVE NEGATIVE mg/dL   Nitrite NEGATIVE NEGATIVE   Leukocytes,Ua NEGATIVE NEGATIVE   RBC / HPF 0-5 0 - 5 RBC/hpf   WBC, UA 0-5 0 - 5 WBC/hpf   Bacteria, UA RARE (A) NONE SEEN   Squamous Epithelial / LPF 0-5 0 - 5   Mucus PRESENT      Constitutional: NAD, AAOx3  HE/ENT: extraocular movements grossly intact, moist mucous membranes CV: RRR PULM: nl respiratory effort, CTABL     Abd: gravid, non-tender, non-distended, soft      Ext:  Non-tender, Nonedmeatous   Psych: mood appropriate, speech normal Pelvic: 1/50/-3 ->unchanged after 2 hours   Fetal monitoring:  Baseline: 125 Variability: moderate Accelerations present x >2 Decelerations absent Toco: irregular, mild contractions   Assessment: 29 y.o. [redacted]w[redacted]d here for antenatal surveillance during pregnancy.  Principle diagnosis: Uterine contractions in pregnancy   Plan:  Labor: not present.   Fetal Wellbeing: Reassuring Cat 1 tracing.  Reactive NST   D/c home stable, precautions reviewed, follow-up as scheduled.   ----- Drinda Butts, CNM Certified Nurse Midwife [facility]

## 2019-08-01 NOTE — OB Triage Note (Signed)
[facility] with complaints of contractions. Pt states that she felt contractions getting closer together. Pt has planned for a c-section due to a traumatic birth experience with her first child. Pt states positive FM and denies any vaginal bleeding/LOF. Rates pain 5/10. Pt states no intercourse in the past 24 hours and not well hydrated.  Monitors applied and assessing. VSS. Initial FHT 135.

## 2019-08-02 DIAGNOSIS — O471 False labor at or after 37 completed weeks of gestation: Secondary | ICD-10-CM | POA: Diagnosis not present

## 2019-08-02 NOTE — Discharge Instructions (Signed)
Come back... leaking of fluid, heavy vaginal bleeding, contractions every 3-5 mins lasting atleast 2 hours, decrease fetal movement  Stay well, get hydrated and get plenty of rest!

## 2019-08-02 NOTE — Progress Notes (Signed)
Pt discharged home in stable condition. Pt given discharged instructions regarding when to come back. All questions answered at this time.

## 2019-08-05 ENCOUNTER — Inpatient Hospital Stay: Admission: RE | Admit: 2019-08-05 | Payer: BLUE CROSS/BLUE SHIELD | Source: Ambulatory Visit

## 2019-08-06 ENCOUNTER — Other Ambulatory Visit: Payer: Self-pay

## 2019-08-06 ENCOUNTER — Encounter
Admission: RE | Admit: 2019-08-06 | Discharge: 2019-08-06 | Disposition: A | Discharge status: Home / Self Care | Payer: BLUE CROSS/BLUE SHIELD | Source: Ambulatory Visit | Attending: Obstetrics and Gynecology | Admitting: Obstetrics and Gynecology

## 2019-08-06 HISTORY — DX: Essential (primary) hypertension: I10

## 2019-08-06 HISTORY — DX: Anemia, unspecified: D64.9

## 2019-08-06 NOTE — Patient Instructions (Signed)
Your procedure is scheduled on: Monday August 12, 2019 Report to Day Surgery. To find out your arrival time please call [phone] between 1PM - 3PM on Friday August 09, 2019.  Remember: Instructions that are not followed completely may result in serious medical risk,  up to and including death, or upon the discretion of your surgeon and anesthesiologist your  surgery may need to be rescheduled.     _X__ 1. Do not eat food after midnight the night before your procedure.                 No gum chewing or hard candies. You may drink clear liquids up to 2 hours                 before you are scheduled to arrive for your surgery- DO not drink clear                 liquids within 2 hours of the start of your surgery.                 Clear Liquids include:  water, apple juice without pulp, clear Gatorade, G2 or                  Gatorade Zero (avoid Red/Purple/Blue), Black Coffee or Tea (Do not add                 anything to coffee or tea).  __X__2.  On the morning of surgery brush your teeth with toothpaste and water, you                may rinse your mouth with mouthwash if you wish.  Do not swallow any toothpaste of mouthwash.     _X__ 3.  No Alcohol for 24 hours before or after surgery.   _X__ 4.  Do Not Smoke or use e-cigarettes For 24 Hours Prior to Your Surgery.                 Do not use any chewable tobacco products for at least 6 hours prior to                 Surgery.  _X__  5.  Do not use any recreational drugs (marijuana, cocaine, heroin, ecstacy, MDMA or other)                For at least one week prior to your surgery.  Combination of these drugs with anesthesia                May have life threatening results.  __X__ 6.  Notify your doctor if there is any change in your medical condition      (cold, fever, infections).     Do not wear jewelry, make-up, hairpins, clips or nail polish. Do not wear lotions, powders, or perfumes. You may wear deodorant. Do  not shave 48 hours prior to surgery. Men may shave face and neck. Do not bring valuables to the hospital.    [facility] is not responsible for any belongings or valuables.  Contacts, dentures or bridgework may not be worn into surgery. Leave your suitcase in the car. After surgery it may be brought to your room. For patients admitted to the hospital, discharge time is determined by your treatment team.   Patients discharged the day of surgery will not be allowed to drive home.   Make arrangements for someone to be with you for the first  24 hours of your Same Day Discharge.   ____ Take these medicines the morning of surgery with A SIP OF WATER:    1. none  __X__ Use CHG wipes as directed  __X__ Stop aspirin on 08/06/19  __X__ Stop Anti-inflammatories such as ibuprofen, Aleve, naproxen and or aspirin.   __X__ Stop supplements until after surgery.    __X__ Do not start any herbal supplements before your surgery.

## 2019-08-08 ENCOUNTER — Other Ambulatory Visit
Admission: RE | Admit: 2019-08-08 | Discharge: 2019-08-08 | Disposition: A | Discharge status: Home / Self Care | Payer: BLUE CROSS/BLUE SHIELD | Source: Ambulatory Visit | Attending: Obstetrics & Gynecology | Admitting: Obstetrics & Gynecology

## 2019-08-08 ENCOUNTER — Other Ambulatory Visit: Payer: Self-pay

## 2019-08-08 ENCOUNTER — Observation Stay
Admission: EM | Admit: 2019-08-08 | Discharge: 2019-08-09 | Disposition: A | Discharge status: Home / Self Care | Payer: BLUE CROSS/BLUE SHIELD | Source: Home / Self Care | Admitting: Obstetrics and Gynecology

## 2019-08-08 ENCOUNTER — Encounter: Payer: Self-pay | Admitting: Obstetrics and Gynecology

## 2019-08-08 DIAGNOSIS — O10913 Unspecified pre-existing hypertension complicating pregnancy, third trimester: Secondary | ICD-10-CM | POA: Insufficient documentation

## 2019-08-08 DIAGNOSIS — Z888 Allergy status to other drugs, medicaments and biological substances status: Secondary | ICD-10-CM | POA: Insufficient documentation

## 2019-08-08 DIAGNOSIS — O99343 Other mental disorders complicating pregnancy, third trimester: Secondary | ICD-10-CM | POA: Insufficient documentation

## 2019-08-08 DIAGNOSIS — O479 False labor, unspecified: Secondary | ICD-10-CM | POA: Diagnosis present

## 2019-08-08 DIAGNOSIS — Z3A38 38 weeks gestation of pregnancy: Secondary | ICD-10-CM | POA: Insufficient documentation

## 2019-08-08 DIAGNOSIS — Z01812 Encounter for preprocedural laboratory examination: Secondary | ICD-10-CM | POA: Insufficient documentation

## 2019-08-08 DIAGNOSIS — F17210 Nicotine dependence, cigarettes, uncomplicated: Secondary | ICD-10-CM | POA: Insufficient documentation

## 2019-08-08 DIAGNOSIS — Z20822 Contact with and (suspected) exposure to covid-19: Secondary | ICD-10-CM | POA: Insufficient documentation

## 2019-08-08 DIAGNOSIS — O471 False labor at or after 37 completed weeks of gestation: Secondary | ICD-10-CM | POA: Insufficient documentation

## 2019-08-08 DIAGNOSIS — O368130 Decreased fetal movements, third trimester, not applicable or unspecified: Secondary | ICD-10-CM

## 2019-08-08 DIAGNOSIS — Z79899 Other long term (current) drug therapy: Secondary | ICD-10-CM | POA: Insufficient documentation

## 2019-08-08 DIAGNOSIS — R899 Unspecified abnormal finding in specimens from other organs, systems and tissues: Secondary | ICD-10-CM

## 2019-08-08 DIAGNOSIS — O99333 Smoking (tobacco) complicating pregnancy, third trimester: Secondary | ICD-10-CM | POA: Insufficient documentation

## 2019-08-08 DIAGNOSIS — O26852 Spotting complicating pregnancy, second trimester: Secondary | ICD-10-CM

## 2019-08-08 DIAGNOSIS — Z7982 Long term (current) use of aspirin: Secondary | ICD-10-CM | POA: Insufficient documentation

## 2019-08-08 DIAGNOSIS — F319 Bipolar disorder, unspecified: Secondary | ICD-10-CM | POA: Insufficient documentation

## 2019-08-08 LAB — CBC
HCT: 33.8 % — ABNORMAL LOW (ref 36.0–46.0)
Hemoglobin: 11.1 g/dL — ABNORMAL LOW (ref 12.0–15.0)
MCH: 28.8 pg (ref 26.0–34.0)
MCHC: 32.8 g/dL (ref 30.0–36.0)
MCV: 87.6 fL (ref 80.0–100.0)
Platelets: 260 10*3/uL (ref 150–400)
RBC: 3.86 MIL/uL — ABNORMAL LOW (ref 3.87–5.11)
RDW: 14.1 % (ref 11.5–15.5)
WBC: 16.7 10*3/uL — ABNORMAL HIGH (ref 4.0–10.5)
nRBC: 0 % (ref 0.0–0.2)

## 2019-08-08 LAB — BASIC METABOLIC PANEL
Anion gap: 12 (ref 5–15)
BUN: 7 mg/dL (ref 6–20)
CO2: 20 mmol/L — ABNORMAL LOW (ref 22–32)
Calcium: 9.4 mg/dL (ref 8.9–10.3)
Chloride: 103 mmol/L (ref 98–111)
Creatinine, Ser: 0.49 mg/dL (ref 0.44–1.00)
GFR calc Af Amer: >60 mL/min (ref >60)
GFR calc non Af Amer: >60 mL/min (ref >60)
Glucose, Bld: 90 mg/dL (ref 70–99)
Potassium: 3.6 mmol/L (ref 3.5–5.1)
Sodium: 135 mmol/L (ref 135–145)

## 2019-08-08 LAB — SARS CORONAVIRUS 2 (TAT 6-24 HRS): SARS Coronavirus 2: NEGATIVE

## 2019-08-08 MED ORDER — ACETAMINOPHEN 325 MG PO TABS: 650.0000 mg | ORAL_TABLET | ORAL | Status: DC | PRN

## 2019-08-08 MED ORDER — CALCIUM CARBONATE ANTACID 500 MG PO CHEW: 2.0000 | CHEWABLE_TABLET | ORAL | Status: DC | PRN

## 2019-08-08 NOTE — OB Triage Note (Signed)
Pt is a 28y/o G3P1 at [redacted]w[redacted]d with c/o ctx that increased in intensity throughout the day. Pt rates pain 7/10 in her mid to lower belly. Pt states +FM. Pt denies LOF and VB. Monitors applied and assessing. Initial FHT 135.

## 2019-08-09 NOTE — Discharge Summary (Signed)
Sonya [name] is a 29 y.o. female. She is at [redacted]w[redacted]d gestation. Patient's last menstrual period was 11/06/2018 (approximate). Estimated Date of Delivery: 08/20/19  Prenatal care site: [facility]   Chief complaint: contractions  Mercades reports contractions that have been feeling more frequent and more intense.  She has been having contractions for a couple of weeks but these felt more intense starting around 2100.  S: Resting comfortably, intermittently pauses with contraction. no VB.no LOF,  Active fetal movement.   Maternal Medical History:  Past Medical Hx:  has a past medical history of Anemia, Anxiety, Bipolar 1 disorder ([place]), Depression, Dyspnea, Hypertension, Meniere disease (UNKNOWN), and Vertigo.    Past Surgical Hx:  has a past surgical history that includes Wrist surgery; Wisdom tooth extraction; Esophagogastroduodenoscopy (egd) with propofol (N/A, 10/25/2017); and Colonoscopy with propofol (N/A, 10/25/2017).   Allergies  Allergen Reactions   Adipex-P [Phentermine] Shortness Of Breath, Swelling, Palpitations and Hypertension    Tunnel vision, chest pain, headache, and feet became swollen   Codeine Nausea And Vomiting   Adhesive [Tape] Rash    Patient prefers paper tape     Prior to Admission medications   Medication Sig Start Date End Date Taking? Authorizing Provider  acetaminophen (TYLENOL) 500 MG tablet Take 1,000 mg by mouth every 6 (six) hours as needed for moderate pain or headache.   Yes [provider]  alum & mag hydroxide-simeth (MAALOX ADVANCED MAX ST) 166-063-01 MG/5ML suspension Take 10 mLs by mouth 4 (four) times daily as needed for indigestion.   Yes [provider]  aspirin EC 81 MG tablet Take 81 mg by mouth at bedtime.    Yes [provider]  calcium carbonate (TUMS - DOSED IN MG ELEMENTAL CALCIUM) 500 MG chewable tablet Chew 2-3 tablets by mouth 4 (four) times daily as needed for indigestion or heartburn.   Yes [provider]  Cholecalciferol (VITAMIN D) 50 MCG (2000 UT) tablet Take 2,000 Units by mouth at bedtime.   Yes [provider]  ferrous gluconate (IRON 27) 240 (27 FE) MG tablet Take 240 mg by mouth at bedtime.   Yes [provider]  lamoTRIgine (LAMICTAL) 100 MG tablet Take 200 mg by mouth at bedtime.    Yes [provider]  metoprolol succinate (TOPROL-XL) 25 MG 24 hr tablet Take 12.5 mg by mouth at bedtime.   Yes [provider]  ondansetron (ZOFRAN-ODT) 4 MG disintegrating tablet Take 4 mg by mouth every 8 (eight) hours as needed for nausea/vomiting. 05/07/19  Yes [provider]  Prenatal Vit-Fe Fumarate-FA (PRENATAL MULTIVITAMIN) TABS tablet Take 1 tablet by mouth at bedtime.    Yes [provider]  sertraline (ZOLOFT) 100 MG tablet Take 200 mg by mouth at bedtime.   Yes [provider]  traZODone (DESYREL) 100 MG tablet Take 100 mg by mouth at bedtime.   Yes [provider]  vitamin B-12 (CYANOCOBALAMIN) 1000 MCG tablet Take 1,000 mcg by mouth at bedtime.    Yes [provider]  amoxicillin (AMOXIL) 250 MG capsule Take 250 mg by mouth 3 (three) times daily.    [provider]  meclizine (ANTIVERT) 25 MG tablet Take 25 mg by mouth 3 (three) times daily as needed (for vertigo).  09/08/17   [provider]  triamcinolone cream (KENALOG) 0.1 % Apply 1 application topically daily as needed (eczema).  05/07/19   [provider]  carbamazepine (TEGRETOL-XR) 100 MG 12 hr tablet Take 1 tablet (100  mg total) by mouth 2 (two) times daily. 12/27/17 01/11/19  Oneta Rack, NP  Dexlansoprazole (DEXILANT) 30 MG capsule Take 30 mg by mouth daily.  01/11/19  [provider]  fluticasone (FLONASE) 50 MCG/ACT nasal spray Place 1 spray into both nostrils daily.  09/08/17 01/11/19  [provider]  pantoprazole (PROTONIX) 40 MG tablet Take 1 tablet (40 mg total) by mouth daily. 05/17/18 01/11/19   Minna Antis, MD  propranolol (INDERAL) 10 MG tablet Take 1 tablet (10 mg total) by mouth 2 (two) times daily as needed. For severe anxiety sx Patient not taking: Reported on 12/18/2017 08/17/17 01/11/19  Jomarie Longs, MD    Social History: She  reports that she has been smoking cigarettes. She has been smoking about 0.20 packs per day. She has never used smokeless tobacco. She reports previous alcohol use. She reports that she does not use drugs.  Family History: family history includes Alcohol abuse in her father; Anxiety disorder in her brother and sister; Bipolar disorder in her brother; Depression in her brother; Drug abuse in her father and sister; Luiz Blare' disease in her mother; Physical abuse in her mother; Seizures in her father.  Review of Systems: A full review of systems was performed and negative except as noted in the HPI.    O:  BP 122/65 (BP Location: Right Arm)    Temp 98.3 F (36.8 C) (Oral)    Resp 16    Ht 5\' 3"  (1.6 m)    Wt 97.1 kg    LMP 11/06/2018 (Approximate)    BMI 37.91 kg/m  Results for orders placed or performed during the hospital encounter of 08/08/19 (from the past 48 hour(s))  Basic metabolic panel   Collection Time: 08/08/19  8:44 AM  Result Value Ref Range   Sodium 135 135 - 145 mmol/L   Potassium 3.6 3.5 - 5.1 mmol/L   Chloride 103 98 - 111 mmol/L   CO2 20 (L) 22 - 32 mmol/L   Glucose, Bld 90 70 - 99 mg/dL   BUN 7 6 - 20 mg/dL   Creatinine, Ser 10/08/19 0.44 - 1.00 mg/dL   Calcium 9.4 8.9 - 1.61 mg/dL   GFR calc non Af Amer >60 >60 mL/min   GFR calc Af Amer >60 >60 mL/min   Anion gap 12 5 - 15  CBC   Collection Time: 08/08/19  8:44 AM  Result Value Ref Range   WBC 16.7 (H) 4.0 - 10.5 K/uL   RBC 3.86 (L) 3.87 - 5.11 MIL/uL   Hemoglobin 11.1 (L) 12.0 - 15.0 g/dL   HCT 10/08/19 (L) 04.5 - 40.9 %   MCV 87.6 80.0 - 100.0 fL   MCH 28.8 26.0 - 34.0 pg   MCHC 32.8 30.0 - 36.0 g/dL   RDW 81.1 91.4 - 78.2 %   Platelets 260 150 - 400 K/uL   nRBC 0.0  0.0 - 0.2 %  Type and screen [facility]   Collection Time: 08/08/19  8:44 AM  Result Value Ref Range   ABO/RH(D) O POS    Antibody Screen POS    Sample Expiration 08/11/2019,2359    Extend sample reason PREGNANT WITHIN 3 MONTHS, UNABLE TO EXTEND    Antibody Identification ANTI e    PT AG Type      NEGATIVE FOR C ANTIGEN NEGATIVE FOR e ANTIGEN Performed at [facility], [address] [place], [place]   SARS CORONAVIRUS 2 (TAT 6-24 HRS) Nasopharyngeal Nasopharyngeal  Swab   Collection Time: 08/08/19  8:47 AM   Specimen: Nasopharyngeal Swab  Result Value Ref Range   SARS Coronavirus 2 NEGATIVE NEGATIVE     Constitutional: NAD, AAOx3  HE/ENT: extraocular movements grossly intact, moist mucous membranes CV: RRR PULM: nl respiratory effort, CTABL     Abd: gravid, non-tender, non-distended, soft      Ext: Non-tender, Nonedmeatous   Psych: mood appropriate, speech normal Pelvic: 1/th/high/post/firm -> unchanged on repeat exam   NST:  Baseline: 125 bpm Variability: moderate Accelerations present x >2 Decelerations absent Toco: q 2-4 min, mild to palpation, occasional moderate palpating contraction  Assessment: 29 y.o. [redacted]w[redacted]d here for antenatal surveillance during pregnancy.  Principle diagnosis: Uterine contractions in pregnancy, labor not present   Plan:  Labor: not present.   Fetal Wellbeing: Reassuring Cat 1 tracing.  Reactive NST   Given option to stay and reevaluate cervix in 2-4 hours.  Contractions feel less intense after emptying bladder but still frequent.    Options for pain management were discussed.  Ixel would prefer to d/c home for now to be more comfortable at home.  She has a prescription for trazodone that she will take for sleep.    Reviewed labor signs, comfort measures, and when to return for evaluation.   D/c home stable, precautions reviewed, follow-up as scheduled.   ----- Margaretmary Eddy, CNM Certified  Nurse Midwife [facility]

## 2019-08-12 ENCOUNTER — Inpatient Hospital Stay
Admission: RE | Admit: 2019-08-12 | Discharge: 2019-08-15 | DRG: 784 | Disposition: A | Discharge status: Home / Self Care | Payer: BLUE CROSS/BLUE SHIELD | Attending: Obstetrics and Gynecology | Admitting: Obstetrics and Gynecology

## 2019-08-12 ENCOUNTER — Other Ambulatory Visit: Payer: Self-pay

## 2019-08-12 ENCOUNTER — Encounter
Admission: RE | Disposition: A | Discharge status: Home / Self Care | Payer: Self-pay | Source: Ambulatory Visit | Attending: Obstetrics and Gynecology

## 2019-08-12 ENCOUNTER — Inpatient Hospital Stay: Payer: BLUE CROSS/BLUE SHIELD | Admitting: Anesthesiology

## 2019-08-12 ENCOUNTER — Encounter: Payer: Self-pay | Admitting: Obstetrics and Gynecology

## 2019-08-12 DIAGNOSIS — O34219 Maternal care for unspecified type scar from previous cesarean delivery: Principal | ICD-10-CM | POA: Diagnosis present

## 2019-08-12 DIAGNOSIS — O99344 Other mental disorders complicating childbirth: Secondary | ICD-10-CM | POA: Diagnosis present

## 2019-08-12 DIAGNOSIS — F17210 Nicotine dependence, cigarettes, uncomplicated: Secondary | ICD-10-CM | POA: Diagnosis present

## 2019-08-12 DIAGNOSIS — Z3A39 39 weeks gestation of pregnancy: Secondary | ICD-10-CM | POA: Diagnosis not present

## 2019-08-12 DIAGNOSIS — Z7982 Long term (current) use of aspirin: Secondary | ICD-10-CM

## 2019-08-12 DIAGNOSIS — O9081 Anemia of the puerperium: Secondary | ICD-10-CM | POA: Diagnosis not present

## 2019-08-12 DIAGNOSIS — F319 Bipolar disorder, unspecified: Secondary | ICD-10-CM | POA: Diagnosis present

## 2019-08-12 DIAGNOSIS — Z302 Encounter for sterilization: Secondary | ICD-10-CM

## 2019-08-12 DIAGNOSIS — N838 Other noninflammatory disorders of ovary, fallopian tube and broad ligament: Secondary | ICD-10-CM | POA: Diagnosis present

## 2019-08-12 DIAGNOSIS — Z9889 Other specified postprocedural states: Secondary | ICD-10-CM

## 2019-08-12 DIAGNOSIS — D62 Acute posthemorrhagic anemia: Secondary | ICD-10-CM | POA: Diagnosis not present

## 2019-08-12 DIAGNOSIS — O368130 Decreased fetal movements, third trimester, not applicable or unspecified: Secondary | ICD-10-CM

## 2019-08-12 DIAGNOSIS — O26852 Spotting complicating pregnancy, second trimester: Secondary | ICD-10-CM

## 2019-08-12 DIAGNOSIS — O3483 Maternal care for other abnormalities of pelvic organs, third trimester: Secondary | ICD-10-CM | POA: Diagnosis present

## 2019-08-12 DIAGNOSIS — Z20822 Contact with and (suspected) exposure to covid-19: Secondary | ICD-10-CM | POA: Diagnosis present

## 2019-08-12 DIAGNOSIS — O99334 Smoking (tobacco) complicating childbirth: Secondary | ICD-10-CM | POA: Diagnosis present

## 2019-08-12 DIAGNOSIS — R899 Unspecified abnormal finding in specimens from other organs, systems and tissues: Secondary | ICD-10-CM

## 2019-08-12 LAB — CBC
HCT: 31.3 % — ABNORMAL LOW (ref 36.0–46.0)
Hemoglobin: 10.4 g/dL — ABNORMAL LOW (ref 12.0–15.0)
MCH: 29.1 pg (ref 26.0–34.0)
MCHC: 33.2 g/dL (ref 30.0–36.0)
MCV: 87.4 fL (ref 80.0–100.0)
Platelets: 242 10*3/uL (ref 150–400)
RBC: 3.58 MIL/uL — ABNORMAL LOW (ref 3.87–5.11)
RDW: 13.9 % (ref 11.5–15.5)
WBC: 14.3 10*3/uL — ABNORMAL HIGH (ref 4.0–10.5)
nRBC: 0 % (ref 0.0–0.2)

## 2019-08-12 LAB — TYPE AND SCREEN
ABO/RH(D): O POS
Antibody Screen: POSITIVE
Extend sample reason: UNDETERMINED
PT AG Type: NEGATIVE

## 2019-08-12 LAB — CREATININE, SERUM
Creatinine, Ser: 0.56 mg/dL (ref 0.44–1.00)
GFR calc Af Amer: >60 mL/min (ref >60)
GFR calc non Af Amer: >60 mL/min (ref >60)

## 2019-08-12 LAB — SARS CORONAVIRUS 2 BY RT PCR (HOSPITAL ORDER, PERFORMED IN ~~LOC~~ HOSPITAL LAB): SARS Coronavirus 2: NEGATIVE

## 2019-08-12 SURGERY — Surgical Case
Anesthesia: Spinal

## 2019-08-12 MED ORDER — DIPHENHYDRAMINE HCL 50 MG/ML IJ SOLN: 12.5000 mg | INTRAMUSCULAR | Status: DC | PRN

## 2019-08-12 MED ORDER — LACTATED RINGERS IV SOLN: Freq: Once | INTRAVENOUS | Status: AC

## 2019-08-12 MED ORDER — MECLIZINE HCL 25 MG PO TABS
25.0000 mg | ORAL_TABLET | Freq: Three times a day (TID) | ORAL | Status: DC | PRN
  Filled 2019-08-12: qty 1

## 2019-08-12 MED ORDER — GABAPENTIN 300 MG PO CAPS
300.0000 mg | ORAL_CAPSULE | Freq: Every day | ORAL | Status: DC
  Administered 2019-08-12 – 2019-08-14 (×3): 300 mg via ORAL
  Filled 2019-08-12 (×3): qty 1

## 2019-08-12 MED ORDER — DIPHENHYDRAMINE HCL 25 MG PO CAPS: 25.0000 mg | ORAL_CAPSULE | Freq: Four times a day (QID) | ORAL | Status: DC | PRN

## 2019-08-12 MED ORDER — ORAL CARE MOUTH RINSE: 15.0000 mL | Freq: Once | OROMUCOSAL | Status: DC

## 2019-08-12 MED ORDER — SODIUM CHLORIDE 0.9 % IV SOLN
INTRAVENOUS | Status: DC | PRN
  Administered 2019-08-12: 30 ug/min via INTRAVENOUS

## 2019-08-12 MED ORDER — ONDANSETRON HCL 4 MG/2ML IJ SOLN: 4.0000 mg | Freq: Three times a day (TID) | INTRAMUSCULAR | Status: DC | PRN

## 2019-08-12 MED ORDER — SIMETHICONE 80 MG PO CHEW
80.0000 mg | CHEWABLE_TABLET | ORAL | Status: DC | PRN
  Administered 2019-08-13 – 2019-08-14 (×2): 80 mg via ORAL
  Filled 2019-08-12 (×2): qty 1

## 2019-08-12 MED ORDER — FENTANYL CITRATE (PF) 100 MCG/2ML IJ SOLN
INTRAMUSCULAR | Status: DC | PRN
  Administered 2019-08-12: 15 ug via INTRAVENOUS

## 2019-08-12 MED ORDER — HEMOSTATIC AGENTS (NO CHARGE) OPTIME
TOPICAL | Status: DC | PRN
  Administered 2019-08-12: 1 via TOPICAL

## 2019-08-12 MED ORDER — SIMETHICONE 80 MG PO CHEW: 80.0000 mg | CHEWABLE_TABLET | ORAL | Status: DC

## 2019-08-12 MED ORDER — CEFAZOLIN SODIUM-DEXTROSE 2-4 GM/100ML-% IV SOLN: 2.0000 g | Freq: Once | INTRAVENOUS | Status: DC

## 2019-08-12 MED ORDER — NICOTINE 14 MG/24HR TD PT24
14.0000 mg | MEDICATED_PATCH | Freq: Every day | TRANSDERMAL | Status: DC
  Administered 2019-08-12 – 2019-08-15 (×4): 14 mg via TRANSDERMAL
  Filled 2019-08-12 (×4): qty 1

## 2019-08-12 MED ORDER — NALBUPHINE HCL 10 MG/ML IJ SOLN
5.0000 mg | Freq: Once | INTRAMUSCULAR | Status: DC | PRN
Start: 2019-08-12 — End: 2019-08-12

## 2019-08-12 MED ORDER — PROPOFOL 10 MG/ML IV BOLUS
INTRAVENOUS | Status: AC
  Filled 2019-08-12: qty 20

## 2019-08-12 MED ORDER — GABAPENTIN 300 MG PO CAPS
300.0000 mg | ORAL_CAPSULE | Freq: Once | ORAL | Status: AC
  Administered 2019-08-12: 300 mg via ORAL
  Filled 2019-08-12: qty 1

## 2019-08-12 MED ORDER — DIPHENHYDRAMINE HCL 25 MG PO CAPS: 25.0000 mg | ORAL_CAPSULE | ORAL | Status: DC | PRN

## 2019-08-12 MED ORDER — NALOXONE HCL 0.4 MG/ML IJ SOLN: 0.4000 mg | INTRAMUSCULAR | Status: DC | PRN

## 2019-08-12 MED ORDER — BUPIVACAINE IN DEXTROSE 0.75-8.25 % IT SOLN
INTRATHECAL | Status: DC | PRN
  Administered 2019-08-12: 1.6 mL via INTRATHECAL

## 2019-08-12 MED ORDER — SOD CITRATE-CITRIC ACID 500-334 MG/5ML PO SOLN
ORAL | Status: AC
  Administered 2019-08-12: 30 mL via ORAL
  Filled 2019-08-12: qty 30

## 2019-08-12 MED ORDER — ACETAMINOPHEN 500 MG PO TABS
1000.0000 mg | ORAL_TABLET | Freq: Four times a day (QID) | ORAL | Status: DC
  Administered 2019-08-12 – 2019-08-14 (×8): 1000 mg via ORAL
  Filled 2019-08-12 (×9): qty 2

## 2019-08-12 MED ORDER — OXYTOCIN-SODIUM CHLORIDE 30-0.9 UT/500ML-% IV SOLN
INTRAVENOUS | Status: AC
  Filled 2019-08-12: qty 500

## 2019-08-12 MED ORDER — ZOLPIDEM TARTRATE 5 MG PO TABS: 5.0000 mg | ORAL_TABLET | Freq: Every evening | ORAL | Status: DC | PRN

## 2019-08-12 MED ORDER — KETOROLAC TROMETHAMINE 30 MG/ML IJ SOLN
30.0000 mg | Freq: Four times a day (QID) | INTRAMUSCULAR | Status: DC
  Administered 2019-08-12 – 2019-08-13 (×3): 30 mg via INTRAVENOUS
  Filled 2019-08-12 (×3): qty 1

## 2019-08-12 MED ORDER — OXYTOCIN-SODIUM CHLORIDE 30-0.9 UT/500ML-% IV SOLN
INTRAVENOUS | Status: DC | PRN
  Administered 2019-08-12: 250 mL/h via INTRAVENOUS

## 2019-08-12 MED ORDER — ACETAMINOPHEN 500 MG PO TABS: 1000.0000 mg | ORAL_TABLET | Freq: Once | ORAL | Status: DC

## 2019-08-12 MED ORDER — SENNOSIDES-DOCUSATE SODIUM 8.6-50 MG PO TABS
2.0000 | ORAL_TABLET | ORAL | Status: DC
  Administered 2019-08-13 – 2019-08-15 (×3): 2 via ORAL
  Filled 2019-08-12 (×3): qty 2

## 2019-08-12 MED ORDER — WITCH HAZEL-GLYCERIN EX PADS: 1.0000 "application " | MEDICATED_PAD | CUTANEOUS | Status: DC | PRN

## 2019-08-12 MED ORDER — OXYCODONE HCL 5 MG PO TABS
5.0000 mg | ORAL_TABLET | ORAL | Status: DC | PRN
  Administered 2019-08-13 – 2019-08-15 (×3): 5 mg via ORAL
  Filled 2019-08-12 (×3): qty 1

## 2019-08-12 MED ORDER — OXYTOCIN-SODIUM CHLORIDE 30-0.9 UT/500ML-% IV SOLN
INTRAVENOUS | Status: AC
  Filled 2019-08-12: qty 1000

## 2019-08-12 MED ORDER — METOPROLOL SUCCINATE ER 25 MG PO TB24
12.5000 mg | ORAL_TABLET | Freq: Every day | ORAL | Status: DC
  Administered 2019-08-12 – 2019-08-14 (×3): 12.5 mg via ORAL
  Filled 2019-08-12 (×5): qty 0.5

## 2019-08-12 MED ORDER — CHLORHEXIDINE GLUCONATE 0.12 % MT SOLN
15.0000 mL | Freq: Once | OROMUCOSAL | Status: DC
Start: 2019-08-12 — End: 2019-08-12

## 2019-08-12 MED ORDER — BUPIVACAINE LIPOSOME 1.3 % IJ SUSP: 20.0000 mL | Freq: Once | INTRAMUSCULAR | Status: DC

## 2019-08-12 MED ORDER — SODIUM CHLORIDE 0.9% FLUSH: 3.0000 mL | INTRAVENOUS | Status: DC | PRN

## 2019-08-12 MED ORDER — VITAMIN D3 25 MCG (1000 UNIT) PO TABS
2000.0000 [IU] | ORAL_TABLET | Freq: Every day | ORAL | Status: DC
  Administered 2019-08-12 – 2019-08-14 (×3): 2000 [IU] via ORAL
  Filled 2019-08-12 (×4): qty 2

## 2019-08-12 MED ORDER — OXYCODONE HCL 5 MG PO TABS
10.0000 mg | ORAL_TABLET | ORAL | Status: DC | PRN
  Administered 2019-08-13 – 2019-08-15 (×3): 10 mg via ORAL
  Filled 2019-08-12 (×3): qty 2

## 2019-08-12 MED ORDER — DIBUCAINE (PERIANAL) 1 % EX OINT: 1.0000 "application " | TOPICAL_OINTMENT | CUTANEOUS | Status: DC | PRN

## 2019-08-12 MED ORDER — LAMOTRIGINE 100 MG PO TABS
200.0000 mg | ORAL_TABLET | Freq: Every evening | ORAL | Status: DC
  Administered 2019-08-12 – 2019-08-14 (×3): 200 mg via ORAL
  Filled 2019-08-12 (×5): qty 2

## 2019-08-12 MED ORDER — FAMOTIDINE 20 MG PO TABS: 20.0000 mg | ORAL_TABLET | Freq: Once | ORAL | Status: DC

## 2019-08-12 MED ORDER — IBUPROFEN 800 MG PO TABS: 800.0000 mg | ORAL_TABLET | Freq: Four times a day (QID) | ORAL | Status: DC

## 2019-08-12 MED ORDER — TETANUS-DIPHTH-ACELL PERTUSSIS 5-2.5-18.5 LF-MCG/0.5 IM SUSP: 0.5000 mL | Freq: Once | INTRAMUSCULAR | Status: DC

## 2019-08-12 MED ORDER — KETOROLAC TROMETHAMINE 30 MG/ML IJ SOLN: 30.0000 mg | Freq: Four times a day (QID) | INTRAMUSCULAR | Status: DC

## 2019-08-12 MED ORDER — CEFAZOLIN SODIUM-DEXTROSE 2-4 GM/100ML-% IV SOLN
2.0000 g | INTRAVENOUS | Status: AC
  Administered 2019-08-12: 2 g via INTRAVENOUS
  Filled 2019-08-12: qty 100

## 2019-08-12 MED ORDER — BUPIVACAINE LIPOSOME 1.3 % IJ SUSP
INTRAMUSCULAR | Status: AC
  Filled 2019-08-12: qty 20

## 2019-08-12 MED ORDER — SIMETHICONE 80 MG PO CHEW
80.0000 mg | CHEWABLE_TABLET | Freq: Three times a day (TID) | ORAL | Status: DC
  Administered 2019-08-12 – 2019-08-15 (×9): 80 mg via ORAL
  Filled 2019-08-12 (×9): qty 1

## 2019-08-12 MED ORDER — COCONUT OIL OIL
1.0000 "application " | TOPICAL_OIL | Status: DC | PRN
  Filled 2019-08-12: qty 120

## 2019-08-12 MED ORDER — KETOROLAC TROMETHAMINE 30 MG/ML IJ SOLN
30.0000 mg | Freq: Four times a day (QID) | INTRAMUSCULAR | Status: DC
  Administered 2019-08-12: 30 mg via INTRAVENOUS
  Filled 2019-08-12: qty 1

## 2019-08-12 MED ORDER — SERTRALINE HCL 100 MG PO TABS
200.0000 mg | ORAL_TABLET | Freq: Every day | ORAL | Status: DC
  Administered 2019-08-12 – 2019-08-14 (×3): 200 mg via ORAL
  Filled 2019-08-12 (×3): qty 2

## 2019-08-12 MED ORDER — MORPHINE SULFATE (PF) 2 MG/ML IV SOLN: 1.0000 mg | INTRAVENOUS | Status: DC | PRN

## 2019-08-12 MED ORDER — NALBUPHINE HCL 10 MG/ML IJ SOLN
5.0000 mg | INTRAMUSCULAR | Status: DC | PRN
  Administered 2019-08-12 (×2): 5 mg via INTRAVENOUS
  Filled 2019-08-12 (×2): qty 1

## 2019-08-12 MED ORDER — NALBUPHINE HCL 10 MG/ML IJ SOLN: 5.0000 mg | INTRAMUSCULAR | Status: DC | PRN

## 2019-08-12 MED ORDER — SODIUM CHLORIDE FLUSH 0.9 % IV SOLN
INTRAVENOUS | Status: DC | PRN
  Administered 2019-08-12: 100 mL

## 2019-08-12 MED ORDER — TRAZODONE HCL 100 MG PO TABS
100.0000 mg | ORAL_TABLET | Freq: Every day | ORAL | Status: DC
  Administered 2019-08-12 – 2019-08-14 (×3): 100 mg via ORAL
  Filled 2019-08-12 (×5): qty 1

## 2019-08-12 MED ORDER — ONDANSETRON HCL 4 MG/2ML IJ SOLN
INTRAMUSCULAR | Status: AC
  Filled 2019-08-12: qty 2

## 2019-08-12 MED ORDER — FENTANYL CITRATE (PF) 100 MCG/2ML IJ SOLN
INTRAMUSCULAR | Status: AC
  Filled 2019-08-12: qty 2

## 2019-08-12 MED ORDER — PRENATAL MULTIVITAMIN CH
1.0000 | ORAL_TABLET | Freq: Every day | ORAL | Status: DC
  Administered 2019-08-12 – 2019-08-14 (×3): 1 via ORAL
  Filled 2019-08-12 (×3): qty 1

## 2019-08-12 MED ORDER — NALOXONE HCL 4 MG/10ML IJ SOLN
1.0000 ug/kg/h | INTRAVENOUS | Status: DC | PRN
  Filled 2019-08-12: qty 5

## 2019-08-12 MED ORDER — MORPHINE SULFATE (PF) 0.5 MG/ML IJ SOLN
INTRAMUSCULAR | Status: DC | PRN
  Administered 2019-08-12: 100 ug via EPIDURAL

## 2019-08-12 MED ORDER — BUPIVACAINE HCL (PF) 0.5 % IJ SOLN
INTRAMUSCULAR | Status: AC
  Filled 2019-08-12: qty 30

## 2019-08-12 MED ORDER — FERROUS SULFATE 325 (65 FE) MG PO TABS
325.0000 mg | ORAL_TABLET | Freq: Two times a day (BID) | ORAL | Status: DC
  Administered 2019-08-13: 325 mg via ORAL
  Filled 2019-08-12 (×2): qty 1

## 2019-08-12 MED ORDER — OXYCODONE HCL 5 MG PO TABS: 5.0000 mg | ORAL_TABLET | ORAL | Status: DC | PRN

## 2019-08-12 MED ORDER — ENOXAPARIN SODIUM 40 MG/0.4ML ~~LOC~~ SOLN
40.0000 mg | SUBCUTANEOUS | Status: DC
  Filled 2019-08-12: qty 0.4

## 2019-08-12 MED ORDER — OXYTOCIN-SODIUM CHLORIDE 30-0.9 UT/500ML-% IV SOLN
2.5000 [IU]/h | INTRAVENOUS | Status: AC
  Administered 2019-08-12: 2.5 [IU]/h via INTRAVENOUS

## 2019-08-12 MED ORDER — MENTHOL 3 MG MT LOZG
1.0000 | LOZENGE | OROMUCOSAL | Status: DC | PRN
  Filled 2019-08-12: qty 9

## 2019-08-12 MED ORDER — MORPHINE SULFATE (PF) 0.5 MG/ML IJ SOLN
INTRAMUSCULAR | Status: AC
  Filled 2019-08-12: qty 10

## 2019-08-12 MED ORDER — ONDANSETRON HCL 4 MG/2ML IJ SOLN
INTRAMUSCULAR | Status: DC | PRN
  Administered 2019-08-12: 4 mg via INTRAVENOUS

## 2019-08-12 MED ORDER — SODIUM CHLORIDE (PF) 0.9 % IJ SOLN
INTRAMUSCULAR | Status: AC
  Filled 2019-08-12: qty 50

## 2019-08-12 MED ORDER — VITAMIN B-12 1000 MCG PO TABS
1000.0000 ug | ORAL_TABLET | Freq: Every day | ORAL | Status: DC
  Administered 2019-08-12 – 2019-08-14 (×3): 1000 ug via ORAL
  Filled 2019-08-12 (×4): qty 1

## 2019-08-12 MED ORDER — SOD CITRATE-CITRIC ACID 500-334 MG/5ML PO SOLN: 30.0000 mL | ORAL | Status: AC

## 2019-08-12 MED ORDER — ACETAMINOPHEN 325 MG PO TABS: 650.0000 mg | ORAL_TABLET | Freq: Four times a day (QID) | ORAL | Status: DC

## 2019-08-12 MED ORDER — LACTATED RINGERS IV SOLN: INTRAVENOUS | Status: DC

## 2019-08-12 SURGICAL SUPPLY — 34 items
BARRIER ADHS 3X4 INTERCEED (GAUZE/BANDAGES/DRESSINGS) ×3 IMPLANT
CANISTER SUCT 3000ML PPV (MISCELLANEOUS) ×3 IMPLANT
CHLORAPREP W/TINT 26 (MISCELLANEOUS) ×3 IMPLANT
COVER WAND RF STERILE (DRAPES) ×3 IMPLANT
DRSG EMULSION OIL 3X8 NADH (GAUZE/BANDAGES/DRESSINGS) ×2 IMPLANT
DRSG TELFA 3X8 NADH (GAUZE/BANDAGES/DRESSINGS) ×3 IMPLANT
ELECT CAUTERY BLADE 6.4 (BLADE) ×3 IMPLANT
ELECT REM PT RETURN 9FT ADLT (ELECTROSURGICAL) ×3
ELECTRODE REM PT RTRN 9FT ADLT (ELECTROSURGICAL) ×1 IMPLANT
GAUZE SPONGE 4X4 12PLY STRL (GAUZE/BANDAGES/DRESSINGS) ×3 IMPLANT
GLOVE SURG SYN 8.0 (GLOVE) ×3 IMPLANT
GLOVE SURG SYN 8.0 PF PI (GLOVE) ×1 IMPLANT
GOWN STRL REUS W/ TWL LRG LVL3 (GOWN DISPOSABLE) ×2 IMPLANT
GOWN STRL REUS W/ TWL XL LVL3 (GOWN DISPOSABLE) ×1 IMPLANT
GOWN STRL REUS W/TWL LRG LVL3 (GOWN DISPOSABLE) ×4
GOWN STRL REUS W/TWL XL LVL3 (GOWN DISPOSABLE) ×2
NS IRRIG 1000ML POUR BTL (IV SOLUTION) ×3 IMPLANT
PACK C SECTION (MISCELLANEOUS) ×3 IMPLANT
PAD DRESSING TELFA 3X8 NADH (GAUZE/BANDAGES/DRESSINGS) ×1 IMPLANT
PAD OB MATERNITY 4.3X12.25 (PERSONAL CARE ITEMS) ×3 IMPLANT
PAD PREP 24X41 OB/GYN DISP (PERSONAL CARE ITEMS) ×3 IMPLANT
PENCIL SMOKE ULTRAEVAC 22 CON (MISCELLANEOUS) ×3 IMPLANT
RETRACTOR TRAXI PANNICULUS (MISCELLANEOUS) IMPLANT
SPONGE GAUZE 4X4 12PLY (GAUZE/BANDAGES/DRESSINGS) ×2 IMPLANT
STAPLER INSORB 30 2030 C-SECTI (MISCELLANEOUS) IMPLANT
STRAP SAFETY 5IN WIDE (MISCELLANEOUS) ×3 IMPLANT
SUCT VACUUM KIWI BELL (SUCTIONS) ×2 IMPLANT
SUT CHROMIC 1 CTX 36 (SUTURE) ×9 IMPLANT
SUT CHROMIC 2 0 CT 1 (SUTURE) ×2 IMPLANT
SUT CHROMIC 2-0 (SUTURE) ×2 IMPLANT
SUT PLAIN GUT 0 (SUTURE) ×6 IMPLANT
SUT VIC AB 0 CT1 36 (SUTURE) ×6 IMPLANT
TAPE PAPER MEDFIX 1IN X 10YD (GAUZE/BANDAGES/DRESSINGS) ×2 IMPLANT
TRAXI PANNICULUS RETRACTOR (MISCELLANEOUS)

## 2019-08-12 NOTE — Brief Op Note (Signed)
08/12/2019  9:23 AM  PATIENT:  Sonya [name]  28 y.o. female  PRE-OPERATIVE DIAGNOSIS:  Elective primary cesarean section , prior fourth degree laceration , elective sterilization  POST-OPERATIVE DIAGNOSIS:  Same as above PROCEDURE:  Procedure(s): CESAREAN SECTION WITH BILATERAL TUBAL LIGATION (N/A)  SURGEON:  Surgeon(s) and Role:    * Toriann Spadoni, Ihor Austin, MD - Primary  PHYSICIAN ASSISTANT: Margaretmary Eddy , CNM   ASSISTANTS: none   ANESTHESIA:   spinal  EBL:  730 mL   BLOOD ADMINISTERED:none  DRAINS: Urinary Catheter (Foley)   LOCAL MEDICATIONS USED:  MARCAINE    and BUPIVICAINE   SPECIMEN:  Source of Specimen:  portion right and left fallopian tubes  DISPOSITION OF SPECIMEN:  PATHOLOGY  COUNTS:  YES  TOURNIQUET:  * No tourniquets in log *  DICTATION: .Other Dictation: Dictation Number verbal  PLAN OF CARE: Admit to inpatient   PATIENT DISPOSITION:  PACU - hemodynamically stable.   Delay start of Pharmacological VTE agent (>24hrs) due to surgical blood loss or risk of bleeding: not applicable

## 2019-08-12 NOTE — Progress Notes (Signed)
Pts labs and covid test done on Thur 6/3. T&S and Covid needed recollection. Pt states OB office sent her for labs on Thursday.

## 2019-08-12 NOTE — Transfer of Care (Signed)
Immediate Anesthesia Transfer of Care Note  Patient: Sonya [name]  Procedure(s) Performed: CESAREAN SECTION WITH BILATERAL TUBAL LIGATION (N/A )  Patient Location: PACU and Mother/Baby  Anesthesia Type:Spinal  Level of Consciousness: awake, alert  and oriented  Airway & Oxygen Therapy: Patient Spontanous Breathing  Post-op Assessment: Report given to RN and Post -op Vital signs reviewed and stable  Post vital signs: Reviewed and stable  Last Vitals:  Vitals Value Taken Time  BP 126/78   Temp    Pulse 86   Resp 16   SpO2 99     Last Pain:  Vitals:   08/12/19 0710  TempSrc:   PainSc: 0-No pain         Complications: No apparent anesthesia complications

## 2019-08-12 NOTE — Discharge Summary (Signed)
Obstetrical Discharge Summary  Patient Name: Sonya [name] DOB: 1990/09/09 MRN: [id number]  Date of Admission: 08/12/2019 Date of Delivery:08/12/19 Delivered by: Beverly Gust MD Date of Discharge: 08/15/19 Primary OB: [facility] [id number] last menstrual period was 11/06/2018 (approximate). EDC Estimated Date of Delivery: 08/18/19 Gestational Age at Delivery: [redacted]w[redacted]d   Antepartum complications:  Cervical Funneling noted at 21 weeks  04/12/19 Cx length 5.93cm with funneling seen with fundal pressure  04/12/19 C/w Dr Feliberto Gottron - Daily vaginal progesterone, and follow-up u/s in 10 days  04/22/19 U/s showed no funneling and cx length = 6.76   [facility] recommendations for overall pregnancy:  Serial fetal echos between 16w-28w-COMPLETED  Peds cardio recs: neonatal ECG should be performed on prior to hospital discharge  Will follow peds cardio biweekly 28-30 weeks  Anatomy US at 18w (scheduled at [facility])  Q monthly [place] starting at 28w  Weekly antepartum testing starting at 32-36 weeks  Delivery at 39w 1. SS-A Antibody positive  Serial fetal echos between 16w-28w  [ x ] Fetal echo [redacted]w[redacted]d: normal  2. Prepregnancy BMI 37  CMP, P/C ratio, wnl, A1c: 5.7  Early 1h OGTT: 132 01/28/2019 3. Subchorionic hemorrhage on ultrasound 01/02/2019  01/02/2019: Lt of IUP=1.37 x 0.51 x 1.26cm  01/24/2019: Lt of IUP=1.35 x 1.06 x 1.32cm  Pelvic rest advised 4. FOB with genetic hx  sister with a baby born with 4p Trisomy and brother with Tourettes.  Pt declined genetic testing when [facility] asked 5. Bipolar disorder, hx severe postpartum depression:  Taking Lamictal, Zoloft, and Trazodone   [facility] for management 6. Meniere's disease:  Followed by ENT  Sx well-controlled with Meclizine prn, rare use  Not taking her prescribed diuretic Triamterene-HCTZ during pregnancy  7. Tachycardia:  12.5 metoprolol daily per cardiology for heart rate control 8.  Current smoker:  Patient is currently smoking 3-5 cigarettes/day. She is working on cutting down.  9. History of preeclampsia at term with first baby:  Diagnosed in week 38 and induced.   Diagnosed with severe features postpartum and had course of magnesium.   Baseline CMP and P/C ratio wnl  Advised to start 81mg  aspirin at 12 weeks. 10. Difficult first delivery:  Attempted vacuum assisted delivery-->forceps assisted delivery and 4th degree laceration  Interested in an elective cesarean delivery for this pregnancy Primary c/section + BTL scheduled 08/12/2019 with TJS  Admitting Diagnosis:  Secondary Diagnosis: Patient Active Problem List   Diagnosis Date Noted   Delivery by elective cesarean section 08/12/2019   Postoperative state 08/12/2019   Uterine contractions during pregnancy 08/01/2019   Encounter for suspected PROM, with rupture of membranes not found 07/27/2019   Decreased fetal movement 07/24/2019   Spotting affecting pregnancy in second trimester 04/09/2019   History of pre-eclampsia 02/25/2019   Obesity affecting pregnancy 02/25/2019   Abnormal laboratory test 02/25/2019   MDD (major depressive disorder), recurrent episode, moderate (HCC) 02/25/2019   Indication for care in labor and delivery, delivered 12/26/2014   Indication for care or intervention related to labor and delivery 12/23/2014   Labor and delivery, indication for care 10/13/2014    Augmentation: N/A Complications: None Intrapartum complications/course:  Date of Delivery:  Delivered By: 12/13/2014 MD Delivery Type: primary cesarean section, low transverse incision + BTL  Anesthesia:spinal Placenta: manual Laceration:  Episiotomy: none Newborn Data: Live born female  Birth Weight:  4070 gm  APGAR: , 7/8  Newborn Delivery   Birth date/time: 08/12/2019 08:40:00 Delivery type: C-Section, Vacuum Assisted Trial  of labor: No C-section categorization: Primary         Postpartum Procedures: none  Edinburgh:  Edinburgh Postnatal Depression Scale Screening Tool 08/13/2019  I have been able to laugh and see the funny side of things. 0  I have looked forward with enjoyment to things. 0  I have blamed myself unnecessarily when things went wrong. 1  I have been anxious or worried for no good reason. 2  I have felt scared or panicky for no good reason. 2  Things have been getting on top of me. 1  I have been so unhappy that I have had difficulty sleeping. 0  I have felt sad or miserable. 1  I have been so unhappy that I have been crying. 1  The thought of harming myself has occurred to me. 0  Edinburgh Postnatal Depression Scale Total 8     Cesarean Section:  Patient had an uncomplicated postpartum course.  By time of discharge on POD#3, her pain was controlled on oral pain medications; she had appropriate lochia and was ambulating, voiding without difficulty, tolerating regular diet and passing flatus.   She was deemed stable for discharge to home.    Discharge Physical Exam:  BP 116/83 (BP Location: Left Arm)    Pulse 88    Temp 98 F (36.7 C) (Oral)    Resp 20    Ht 5\' 3"  (1.6 m)    Wt 97.1 kg    LMP 11/06/2018 (Approximate)    SpO2 98%    Breastfeeding Unknown    BMI 37.91 kg/m   General: NAD CV: RRR Pulm: CTABL, nl effort ABD: s/nd/nt, fundus firm and below the umbilicus Lochia: moderate Incision: c/d/i DVT Evaluation: LE non-ttp, no evidence of DVT on exam.  Hemoglobin  Date Value Ref Range Status  08/13/2019 9.5 (L) 12.0 - 15.0 g/dL Final   HGB  Date Value Ref Range Status  06/15/2014 11.6 (L) 12.0 - 16.0 g/dL Final   HCT  Date Value Ref Range Status  08/13/2019 29.5 (L) 36 - 46 % Final  06/15/2014 35.1 35.0 - 47.0 % Final     Disposition: stable, discharge to home. Baby Feeding: breastmilk and formula Baby Disposition: home with mom  Rh Immune globulin given: n/a Rubella vaccine given: immune Tdap vaccine given in AP or  PP setting: 06/05/19 Flu vaccine given in AP or PP setting: declined  Contraception: BTL done   Prenatal Labs:  ABO, Rh:  O+ Antibody:  neg Rubella:  IMM/ Varicella IMM RPR:   NR HBsAg:   Neg HIV:   Neg  GBS:   [+ ]   Plan:  Analiz D Mayo was discharged to home in good condition. Follow-up appointment with delivering provider in 2 weeks.  Discharge Medications: Allergies as of 08/15/2019      Reactions   Adipex-p [phentermine] Shortness Of Breath, Swelling, Palpitations, Hypertension   Tunnel vision, chest pain, headache, and feet became swollen   Codeine Nausea And Vomiting   Adhesive [tape] Rash   Patient prefers paper tape      Medication List    STOP taking these medications   aspirin EC 81 MG tablet   calcium carbonate 500 MG chewable tablet Commonly known as: TUMS - dosed in mg elemental calcium   Maalox Advanced Max St 400-400-40 MG/5ML suspension Generic drug: alum & mag hydroxide-simeth     TAKE these medications   acetaminophen 500 MG tablet Commonly known as: TYLENOL Take 2 tablets (1,000 mg total)  by mouth every 6 (six) hours. What changed:   when to take this  reasons to take this   gabapentin 300 MG capsule Commonly known as: NEURONTIN Take 1 capsule (300 mg total) by mouth at bedtime for 3 days.   ibuprofen 800 MG tablet Commonly known as: ADVIL Take 1 tablet (800 mg total) by mouth every 6 (six) hours.   Iron 27 240 (27 FE) MG tablet Generic drug: ferrous gluconate Take 240 mg by mouth at bedtime.   lamoTRIgine 100 MG tablet Commonly known as: LAMICTAL Take 200 mg by mouth at bedtime.   meclizine 25 MG tablet Commonly known as: ANTIVERT Take 25 mg by mouth 3 (three) times daily as needed (for vertigo).   metoprolol succinate 25 MG 24 hr tablet Commonly known as: TOPROL-XL Take 12.5 mg by mouth at bedtime.   ondansetron 4 MG disintegrating tablet Commonly known as: ZOFRAN-ODT Take 4 mg by mouth every 8 (eight) hours as needed  for nausea/vomiting.   oxyCODONE 5 MG immediate release tablet Commonly known as: Oxy IR/ROXICODONE Take 1 tablet (5 mg total) by mouth every 6 (six) hours as needed for up to 7 days for moderate pain.   prenatal multivitamin Tabs tablet Take 1 tablet by mouth at bedtime.   senna-docusate 8.6-50 MG tablet Commonly known as: Senokot-S Take 2 tablets by mouth daily. Start taking on: August 16, 2019   sertraline 100 MG tablet Commonly known as: ZOLOFT Take 200 mg by mouth at bedtime.   simethicone 80 MG chewable tablet Commonly known as: MYLICON Chew 1 tablet (80 mg total) by mouth 3 (three) times daily after meals.   traZODone 100 MG tablet Commonly known as: DESYREL Take 100 mg by mouth at bedtime.   triamcinolone cream 0.1 % Commonly known as: KENALOG Apply 1 application topically daily as needed (eczema).   vitamin B-12 1000 MCG tablet Commonly known as: CYANOCOBALAMIN Take 1,000 mcg by mouth at bedtime.   Vitamin D 50 MCG (2000 UT) tablet Take 2,000 Units by mouth at bedtime.        Follow-up Information    Schermerhorn, Gwen Her, MD. Go on 08/27/2019.   Specialty: Obstetrics and Gynecology Why: Postpartum follow up appointment on Tuesday June 22nd at 11:15 AM with Dr. Ouida Sills for an incision check Contact information: [address] [place] [address] [phone]               Signed: Francetta Found, Gregory 08/15/2019 12:20 PM

## 2019-08-12 NOTE — Anesthesia Procedure Notes (Signed)
Spinal  Start time: 08/12/2019 8:14 AM End time: 08/12/2019 8:18 AM Staffing Performed: resident/CRNA  Anesthesiologist: Alver Fisher, MD Resident/CRNA: Elmarie Mainland, CRNA Preanesthetic Checklist Completed: patient identified, IV checked, site marked, risks and benefits discussed, surgical consent, monitors and equipment checked, pre-op evaluation and timeout performed Spinal Block Patient position: sitting Prep: ChloraPrep Patient monitoring: continuous pulse ox and blood pressure Approach: midline Location: L3-4 Injection technique: single-shot Needle Needle type: Pencan  Needle gauge: 24 G Assessment Sensory level: T2 Additional Notes Negative Heme, negative paresthesia.

## 2019-08-12 NOTE — Lactation Note (Addendum)
This note was copied from a baby's chart. Lactation Consultation Note  Patient Name: Sonya [name] MEQAS'T Date: 08/12/2019 Reason for consult: Follow-up assessment;Mother's request;NICU baby;Term;Other (Comment)(Initiated pumping while Sonya [name] in [facility])  Sonya [name] was tranferred to [employer] with dusky/purple lower extremities, desaturations with feeding and 42 blood glucose.  Mom willing to pump.  Set up Symphony pump with instructions in breast massage, hand expression, pumping, collection, storage, cleaning, labeling and handling of expressed milk.  Mom expressed 43 ml.  Mom reports leaking since 27 weeks.  Mom was unable to latch last baby and pumped for 6 weeks.  She reports continually getting plugged ducts.  Encouraged mom to pump every 3 hours or 8 or more times in 24 hours to prevent engorgement, plugged ducts or mastitis with such an abundant milk supply.  Discussed taking Lecithin if had plugged ducts again with Sonya [name].  Mom reports baby will get MCD.  Mom had maternal BCBS.  AEROFLOW information given for mom to get DEBP.  Referral faxed to [facility] and message left with Annice Pih at [facility] for mom to get DEBP when discharged.  Lactation will need to follow up at discharge.  Lactation name and number written on white board and encouraged to call with any questions, concerns or assistance.  Maternal Data Formula Feeding for Exclusion: No Has patient been taught Hand Expression?: Yes Does the patient have breastfeeding experience prior to this delivery?: Yes  Feeding Feeding Type: Bottle Fed - Breast Milk Nipple Type: Regular  LATCH Score                   Interventions Interventions: Hand express;Expressed milk;DEBP  Lactation Tools Discussed/Used Tools: Pump Nipple shield size: 20 Breast pump type: Double-Electric Breast Pump WIC Program: Yes(Baby will get MCD - Mom had maternal BCBS) Pump Review: Setup, frequency, and cleaning;Milk Storage;Other (comment) Initiated by::  S.Jerolyn Flenniken,RN,BSN,IBCLC Date initiated:: 08/12/19   Consult Status Consult Status: Follow-up Follow-up type: Call as needed    Sonya [name] 08/12/2019, 8:09 PM

## 2019-08-12 NOTE — Progress Notes (Signed)
Patient ID: Sonya [name], female   DOB: 11-17-1990, 29 y.o.   MRN: [id number] Pt ready for elective Primary LTCS and BTL . Labs reviewed / All questions answered. She reconfirms her desire for BTL .

## 2019-08-12 NOTE — Anesthesia Postprocedure Evaluation (Deleted)
Anesthesia Post Note  Patient: Sonya [name]  Procedure(s) Performed: CESAREAN SECTION WITH BILATERAL TUBAL LIGATION (N/A )  Anesthesia Type: Spinal     Last Vitals:  Vitals:   08/12/19 0546 08/12/19 0933  BP:  126/78  Pulse:  86  Resp: 16 16  Temp: 37.2 C   SpO2:  99%    Last Pain:  Vitals:   08/12/19 0710  TempSrc:   PainSc: 0-No pain                 Jett Fukuda B Alonza Smoker

## 2019-08-12 NOTE — Anesthesia Preprocedure Evaluation (Signed)
Anesthesia Evaluation  Patient identified by MRN, date of birth, ID band Patient awake    Reviewed: Allergy & Precautions, NPO status , Patient's Chart, lab work & pertinent test results  History of Anesthesia Complications Negative for: history of anesthetic complications  Airway Mallampati: III  TM Distance: >3 FB Neck ROM: Full    Dental no notable dental hx.    Pulmonary neg sleep apnea, neg COPD, Current Smoker and Patient abstained from smoking.,    breath sounds clear to auscultation- rhonchi (-) wheezing      Cardiovascular Exercise Tolerance: Good (-) hypertension(-) CAD, (-) Past MI, (-) Cardiac Stents and (-) CABG  Rhythm:Regular Rate:Normal - Systolic murmurs and - Diastolic murmurs    Neuro/Psych neg Seizures PSYCHIATRIC DISORDERS Anxiety Depression Bipolar Disorder negative neurological ROS     GI/Hepatic negative GI ROS, Neg liver ROS,   Endo/Other  negative endocrine ROSneg diabetes  Renal/GU negative Renal ROS     Musculoskeletal negative musculoskeletal ROS (+)   Abdominal (+) + obese,   Peds  Hematology  (+) anemia ,   Anesthesia Other Findings Past Medical History: No date: Anemia No date: Anxiety No date: Bipolar 1 disorder (HCC) No date: Depression No date: Dyspnea No date: Hypertension     Comment:  GTPN UNKNOWN: Meniere disease No date: Vertigo   Reproductive/Obstetrics (+) Pregnancy                             Lab Results  Component Value Date   WBC 16.7 (H) 08/08/2019   HGB 11.1 (L) 08/08/2019   HCT 33.8 (L) 08/08/2019   MCV 87.6 08/08/2019   PLT 260 08/08/2019    Anesthesia Physical Anesthesia Plan  ASA: II  Anesthesia Plan: Spinal   Post-op Pain Management:    Induction:   PONV Risk Score and Plan: 1 and Ondansetron  Airway Management Planned: Natural Airway  Additional Equipment:   Intra-op Plan:   Post-operative Plan:    Informed Consent: I have reviewed the patients History and Physical, chart, labs and discussed the procedure including the risks, benefits and alternatives for the proposed anesthesia with the patient or authorized representative who has indicated his/her understanding and acceptance.     Dental advisory given  Plan Discussed with: CRNA and Anesthesiologist  Anesthesia Plan Comments:         Anesthesia Quick Evaluation

## 2019-08-12 NOTE — Anesthesia Procedure Notes (Deleted)
Spinal  Patient location during procedure: OR Start time: 08/12/2019 8:14 AM End time: 08/12/2019 8:17 AM Staffing Performed: resident/CRNA  Anesthesiologist: Alver Fisher, MD Resident/CRNA: Elmarie Mainland, CRNA Preanesthetic Checklist Completed: patient identified, IV checked, site marked, risks and benefits discussed, surgical consent, monitors and equipment checked, pre-op evaluation and timeout performed Spinal Block Patient position: sitting Prep: ChloraPrep Patient monitoring: heart rate, continuous pulse ox and blood pressure Approach: midline Location: L3-4 Injection technique: single-shot Needle Needle type: Introducer and Pencil-Tip  Needle gauge: 24 G Needle length: 9 cm Assessment Sensory level: T4

## 2019-08-12 NOTE — Op Note (Signed)
NAME: Sonya [name], Sonya [name]. MEDICAL RECORD PX:[id number] ACCOUNT [id number] DATE OF BIRTH:02/08/1991 FACILITY: [facility] LOCATION: [facility] PHYSICIAN:Hazely Sealey Cloyde Reams, MD  OPERATIVE REPORT  DATE OF PROCEDURE:  08/12/2019  PREOPERATIVE DIAGNOSES: 1.  39+1 weeks estimated gestational age. 2.  Elective primary cesarean section for prior history of 4th-degree laceration. 3.  Elective sterilization.  POSTOPERATIVE DIAGNOSES:   1.  39+1 weeks estimated gestational age. 2.  Elective primary cesarean section for prior history of 4th-degree laceration. 3.  Elective sterilization.  PROCEDURE:   1.  Primary low transverse cesarean section. 2.  Bilateral tubal ligation.  ANESTHESIA:  Spinal.  SURGEON:  Jennell Corner, MD  FIRST ASSISTANT:  Kathrin Ruddy, certified nurse midwife  INDICATIONS:  A 29 year old gravida 3, para 1 patient at 39+1 weeks estimated gestational age.  Prior pregnancy complicated by forceps delivery with 4th-degree laceration.  The patient elects for primary low transverse cesarean section and elective  permanent sterilization.  The patient reconfirms her desire for sterilization the day of the study.  DESCRIPTION OF PROCEDURE:  After adequate spinal anesthesia, the patient was placed in dorsal supine position, hip roll placed under the right side.  The patient's abdomen was prepped and draped in normal sterile fashion.  Timeout was performed.  The  patient did receive 2 grams IV Ancef prior to commencement of the case for surgical prophylaxis.  A Pfannenstiel incision was made 2 fingerbreadths above the symphysis pubis.  Sharp dissection was used to identify the fascia.  Fascia was opened in the  midline and opened in a transverse fashion.  The superior aspect of the fascia was grasped with Kocher clamps and the recti muscles were dissected free.  Inferior aspect of the fascia was grasped with Kocher clamps and the pyramidalis muscle was  dissected free.  Entry into  the peritoneal cavity was accomplished sharply.  The vesicouterine peritoneal fold was identified and a bladder flap was created and the bladder was reflected inferiorly.  Low transverse uterine incision was made.  Upon entry  into the endometrial cavity, clear fluid resulted.  A Kiwi vacuum was then placed on the fetal occiput to aid in delivery.  With one gentle pull, the head was delivered and the vacuum was removed.  A loose nuchal cord was reduced followed by delivery of  the shoulders and the body without difficulty.  A vigorous female was then dried on the abdomen for 60 seconds and his cord was then clamped and he was passed to nursery staff who assigned Apgar scores of 7 and 8, vigorous female.  Time of delivery 0840 on  08/12/2019.  Fetal weight 4070 grams.  The placenta was manually delivered and the uterus was exteriorized.  The endometrial cavity was wiped clean with laparotomy tape.  Uterine incision was then closed with 1 chromic suture in a running locking  fashion.  Good approximation of edges.  Two additional figure-of-eight sutures were required for hemostasis.  Attention was directed to the patient's right fallopian tube, which was grasped by the midportion and 2 separate 0 plain gut sutures were placed  and a 1.5 cm portion of fallopian tube was removed.  Good hemostasis was noted.  Attention was directed to the patient's left fallopian tube again grasping the midportion and 2 separate 0 plain gut sutures were placed and a 1.5 cm portion of fallopian  tube was removed.  A paratubal cyst was then excised as well, an approximately 2 cm cyst.  Good hemostasis was noted.  Posterior cul-de-sac was irrigated and suctioned  and the uterus was placed back into the abdominal cavity.  Pericolic gutters were  wiped clean with laparotomy tape.  Both tubal ligation sites appeared hemostatic.  The uterine incision appeared hemostatic.  Interceed was placed over the uterine incision in a T-shaped fashion.   Fascia was then closed with 0 Vicryl suture in a running  nonlocking fashion.  Good approximation of tissues.  Good hemostasis was noted.  Fascial edges were then injected with Exparel solution containing 20 mL of 1.3% Exparel plus 30 mL of 0.5% Marcaine plus 50 mL normal saline.  Fifty mL of this solution was  injected subfascially.  The patient's subcutaneous tissues were then irrigated and bovied for hemostasis.  Given the depth of the subcutaneous tissues of approximately 3.5 cm the dead space was closed with a running 2-0 chromic suture.  Skin was then  reapproximated with Insorb absorbable staples.  Good cosmetic effect.  The patient tolerated the procedure well.  ESTIMATED BLOOD LOSS:  730 mL quantitative measured.  INTRAOPERATIVE FLUIDS:  1100 mL.  URINE OUTPUT:  200 mL.  DISPOSITION:  The patient was taken to recovery room in good condition.  CN/NUANCE  D:08/12/2019 T:08/12/2019 JOB:[id number]

## 2019-08-12 NOTE — Lactation Note (Signed)
This note was copied from a baby's chart. Lactation Consultation Note  Patient Name: Sonya [name] Today's Date: 08/12/2019   Worked with mom for first breast feed after C/S.  Mom has flat nipples that slightly invert on compression.  Attempted to keep [name] latched for 5 to 10 minutes without success.  We could only get a shallow latch and he would slip off the breast.  He continued to root for the breast, but struggled to sustain the latch.  Mom had the same issue with her first baby and reports giving up and just pumping.  Demonstrated how to place #20 nipple shield.  Easily hand expressed some colostrum into the nipple shield which was encouraging to mom.  She reports she has been leaking since 27 weeks.  [name] was able to maintain the latch with the nipple shield with strong tugs felt by mom.  He sucked for 10 minutes before starting to push the nipple shield out of his mouth.  He fell asleep and refused to try to latch again.  Mom left him skin to skin at the breast.  Mom has a history of bipolar disease and is on Lamictal, Zoloft and Trazodone which are all L2 categories.  Reviewed feeding cues, normal newborn stomach size, supply and demand, normal course of lactation and routine newborn stomach size.  Maternal Data    Feeding Feeding Type: Breast Fed  LATCH Score Latch: Grasps breast easily, tongue down, lips flanged, rhythmical sucking.  Audible Swallowing: Spontaneous and intermittent  Type of Nipple: Inverted  Comfort (Breast/Nipple): Soft / non-tender  Hold (Positioning): Assistance needed to correctly position infant at breast and maintain latch.  LATCH Score: 7  Interventions Interventions: Breast feeding basics reviewed;Assisted with latch;Breast compression;Support pillows(nipple shield)  Lactation Tools Discussed/Used     Consult Status      Louis Meckel 08/12/2019, 3:26 PM

## 2019-08-13 LAB — CBC
HCT: 29.5 % — ABNORMAL LOW (ref 36.0–46.0)
Hemoglobin: 9.5 g/dL — ABNORMAL LOW (ref 12.0–15.0)
MCH: 28.6 pg (ref 26.0–34.0)
MCHC: 32.2 g/dL (ref 30.0–36.0)
MCV: 88.9 fL (ref 80.0–100.0)
Platelets: 185 10*3/uL (ref 150–400)
RBC: 3.32 MIL/uL — ABNORMAL LOW (ref 3.87–5.11)
RDW: 13.9 % (ref 11.5–15.5)
WBC: 12.2 10*3/uL — ABNORMAL HIGH (ref 4.0–10.5)
nRBC: 0 % (ref 0.0–0.2)

## 2019-08-13 LAB — SURGICAL PATHOLOGY

## 2019-08-13 MED ORDER — IBUPROFEN 800 MG PO TABS
800.0000 mg | ORAL_TABLET | Freq: Four times a day (QID) | ORAL | Status: DC
  Administered 2019-08-13 – 2019-08-15 (×9): 800 mg via ORAL
  Filled 2019-08-13 (×9): qty 1

## 2019-08-13 NOTE — Anesthesia Postprocedure Evaluation (Signed)
Anesthesia Post Note  Patient: Sonya [name]  Procedure(s) Performed: CESAREAN SECTION WITH BILATERAL TUBAL LIGATION (N/A )  Patient location during evaluation: Mother Baby Anesthesia Type: Spinal Level of consciousness: oriented and awake and alert Pain management: pain level controlled Vital Signs Assessment: post-procedure vital signs reviewed and stable Respiratory status: spontaneous breathing and respiratory function stable Cardiovascular status: blood pressure returned to baseline and stable Postop Assessment: no headache, no backache, no apparent nausea or vomiting and able to ambulate Anesthetic complications: no     Last Vitals:  Vitals:   08/12/19 2300 08/13/19 0829  BP: 120/69   Pulse: 81   Resp: 20   Temp: 36.4 C   SpO2:  95%    Last Pain:  Vitals:   08/12/19 2300  TempSrc: Oral  PainSc: 0-No pain                 Doyal Saric B Alonza Smoker

## 2019-08-13 NOTE — Anesthesia Post-op Follow-up Note (Signed)
  Anesthesia Pain Follow-up Note  Patient: Sonya [name]  Day #: 1  Date of Follow-up: 08/13/2019 Time: 10:26 AM  Last Vitals:  Vitals:   08/12/19 2300 08/13/19 0829  BP: 120/69   Pulse: 81   Resp: 20   Temp: 36.4 C   SpO2:  95%    Level of Consciousness: alert  Pain: none   Side Effects:None  Catheter Site Exam:clean, dry, no drainage  Anti-Coag Meds (From admission, onward)   Start     Dose/Rate Route Frequency Ordered Stop   08/13/19 0900  enoxaparin (LOVENOX) injection 40 mg     40 mg Subcutaneous Every 24 hours 08/12/19 1301         Plan: D/C from anesthesia care at surgeon's request  Maryanne Huneycutt B Alonza Smoker

## 2019-08-13 NOTE — Progress Notes (Signed)
Post Partum Day 1 Subjective: Doing well, no complaints.  Tolerating regular diet, pain with PO meds, voiding and ambulating without difficulty.  No CP SOB Fever,Chills, N/V or leg pain; denies nipple or breast pain no HA change of vision, RUQ/epigastric pain  Objective: BP 120/69 (BP Location: Left Arm)   Pulse 81   Temp 97.6 F (36.4 C) (Oral)   Resp 20   Ht 5\' 3"  (1.6 m)   Wt 97.1 kg   LMP 11/06/2018 (Approximate)   SpO2 95%   Breastfeeding Unknown   BMI 37.91 kg/m    Physical Exam:  General: NAD Breasts: soft/nontender- pumping.  CV: RRR Pulm: nl effort, CTABL Abdomen: soft, NT, BS x 4 Incision: Dsg CDI, no erythema or drainage Lochia: scant Uterine Fundus: fundus firm and 1 fb below umbilicus DVT Evaluation: no cords, ttp LEs   Recent Labs    08/12/19 1313 08/13/19 0519  HGB 10.4* 9.5*  HCT 31.3* 29.5*  WBC 14.3* 12.2*  PLT 242 185    Assessment/Plan: 29 y.o. 10/13/19 postpartum day # 1  - Continue routine PP care - Lactation consult prn- infant in SCN, mom pumping.  - BTL done with surgery.  - Acute blood loss anemia - hemodynamically stable and asymptomatic; start po ferrous sulfate BID with stool softeners  - Immunization status:  all Imms up to date    Disposition: Does not desire Dc home today.     [id number], CNM 08/13/2019  9:02 AM

## 2019-08-13 NOTE — Lactation Note (Signed)
This note was copied from a baby's chart. Lactation Consultation Note  Patient Name: Sonya [name] [id number] Date: 08/13/2019 Reason for consult: Follow-up assessment;Term;Other (Comment)(Return from [facility])  LC in to see mom and follow-up with pumping and baby at breast now that he is back rooming-in. Baby was at the breast with assistance from RN, cross cradle position, use of boppy pillow with good alignment, and baby latched with nipple shield.  Baby fed for approximately 13 minutes before popping off independently and appearing content.  LC answered questions for mom regarding pumping and nipple trauma. Discussed with mom comfort gels, alternating use of coconut oil, pump suction levels and duration of max of 15 minutes to prevent ongoing trauma. Encouraged alternating positions of baby at the breast, breast massage and compression to help move colostrum into nipple shield for baby, and asking for assistance as needed.   Maternal Data Formula Feeding for Exclusion: No Has patient been taught Hand Expression?: Yes  Feeding Feeding Type: Breast Fed Nipple Type: Slow - flow  LATCH Score Latch: Grasps breast easily, tongue down, lips flanged, rhythmical sucking.  Audible Swallowing: A few with stimulation  Type of Nipple: Inverted  Comfort (Breast/Nipple): Filling, red/small blisters or bruises, mild/mod discomfort  Hold (Positioning): Assistance needed to correctly position infant at breast and maintain latch.  LATCH Score: 5  Interventions Interventions: Breast feeding basics reviewed;Comfort gels  Lactation Tools Discussed/Used Tools: Coconut oil;Nipple Dorris Carnes;Shells(nipples red and sore)   Consult Status Consult Status: Follow-up Date: 08/13/19 Follow-up type: In-patient    Danford Bad 08/13/2019, 3:32 PM

## 2019-08-14 MED ORDER — ACETAMINOPHEN 500 MG PO TABS
1000.0000 mg | ORAL_TABLET | Freq: Four times a day (QID) | ORAL | Status: DC
  Administered 2019-08-14 – 2019-08-15 (×3): 1000 mg via ORAL
  Filled 2019-08-14 (×3): qty 2

## 2019-08-14 NOTE — Progress Notes (Signed)
Post op Day 2  Subjective:  Doing well, she is working on managing her pain. Ambulating without difficulty, pain managed with PO meds, tolerating regular diet, and voiding without difficulty.   No fever/chills, chest pain, shortness of breath, nausea/vomiting, or leg pain. Nipples are sore but no nipple abrasions.  Objective: BP 108/65 (BP Location: Left Arm)   Pulse 84   Temp 98 F (36.7 C) (Oral)   Resp 18   Ht 5\' 3"  (1.6 m)   Wt 97.1 kg   LMP 11/06/2018 (Approximate)   SpO2 99%   Breastfeeding Unknown   BMI 37.91 kg/m    Physical Exam:  General: alert and cooperative Breasts: soft/nontender CV: RRR Pulm: nl effort Abdomen: soft, non-tender Uterine Fundus: firm Incision: healing well, honeycomb dressing on and C/D/I Perineum: intact Lochia: appropriate DVT Evaluation: No evidence of DVT seen on physical exam. Edinburgh:  Edinburgh Postnatal Depression Scale Screening Tool 08/13/2019  I have been able to laugh and see the funny side of things. 0  I have looked forward with enjoyment to things. 0  I have blamed myself unnecessarily when things went wrong. 1  I have been anxious or worried for no good reason. 2  I have felt scared or panicky for no good reason. 2  Things have been getting on top of me. 1  I have been so unhappy that I have had difficulty sleeping. 0  I have felt sad or miserable. 1  I have been so unhappy that I have been crying. 1  The thought of harming myself has occurred to me. 0  Edinburgh Postnatal Depression Scale Total 8     Recent Labs    08/12/19 1313 08/13/19 0519  HGB 10.4* 9.5*  HCT 31.3* 29.5*  WBC 14.3* 12.2*  PLT 242 185    Assessment/Plan: 29 y.o. 10/13/19 postpartum/postop day # 2  -Continue routine postpartum care -Lactation consult PRN for breastfeeding  -Acute blood loss anemia - hemodynamically stable and asymptomatic; start PO ferrous sulfate BID with stool softeners  -Immunization status: all immunizations up to  date  Disposition: Continue inpatient postpartum care    LOS: 2 days   Jatara Huettner, CNM 08/14/2019, 9:52 AM

## 2019-08-15 LAB — TYPE AND SCREEN
ABO/RH(D): O POS
Antibody Screen: POSITIVE
Unit division: 0
Unit division: 0

## 2019-08-15 LAB — BPAM RBC
Blood Product Expiration Date: 202107032359
Blood Product Expiration Date: 202107112359
Unit Type and Rh: 5100
Unit Type and Rh: 5100

## 2019-08-15 MED ORDER — GABAPENTIN 300 MG PO CAPS: 300.0000 mg | ORAL_CAPSULE | Freq: Every day | ORAL | 0 refills | Status: DC

## 2019-08-15 MED ORDER — SENNOSIDES-DOCUSATE SODIUM 8.6-50 MG PO TABS: 2.0000 | ORAL_TABLET | ORAL | 0 refills | Status: DC

## 2019-08-15 MED ORDER — SIMETHICONE 80 MG PO CHEW: 80.0000 mg | CHEWABLE_TABLET | Freq: Three times a day (TID) | ORAL | 0 refills | Status: DC

## 2019-08-15 MED ORDER — IBUPROFEN 800 MG PO TABS: 800.0000 mg | ORAL_TABLET | Freq: Four times a day (QID) | ORAL | 0 refills | Status: DC

## 2019-08-15 MED ORDER — OXYCODONE HCL 5 MG PO TABS: 5.0000 mg | ORAL_TABLET | Freq: Four times a day (QID) | ORAL | 0 refills | Status: AC | PRN

## 2019-08-15 MED ORDER — ACETAMINOPHEN 500 MG PO TABS: 1000.0000 mg | ORAL_TABLET | Freq: Four times a day (QID) | ORAL | 0 refills | Status: DC

## 2019-08-15 NOTE — Progress Notes (Signed)
Discharge order received from doctor. Incision cleaning kit given and reviewed. Reviewed discharge instructions and prescriptions with patient and answered all questions. Follow up appointment given. Patient verbalized understanding. Patient discharged home (without infant; infant in SCN) via wheelchair by nursing/auxillary.     Audree Bane, RN

## 2019-08-15 NOTE — Lactation Note (Signed)
This note was copied from a baby's chart. Lactation Consultation Note  Patient Name: Sonya [name] [id number] Date: 08/15/2019 Reason for consult: Follow-up assessment  LC in to see mom before her discharge. Baby staying in SCN and will "room-in" tonight before anticipated discharge tomorrow. Baby has been back and forth to SCN due to respiratory concerns. Mom has been stimulating the breast through hand expression and some EBP use; preferring hand expressing. Baby is currently getting Similac Sensitive for reflux concerns, and mom has questions re: feeding journey of both breast and bottle. LC reviewed tips and strategies for breastfeeding, providing expressed breastmilk, and formula for baby. Mom encouraged to stimulation/express/feed at breast 8x per 24 hours, breast massage and compression, warmth to be used while expressing, cold post expression. Sunflower lecithin suggested due to history of plugged ducts (with her first child).  Mom reports she will obtain a pump through [facility], as well as formula, and will continue to work on feedings at the breast but concentrate more on expression and bottle feeding at this time. LC provided support and verbal understanding. Information given for outpatient lactation services and community breastfeeding support.  Maternal Data Formula Feeding for Exclusion: No Has patient been taught Hand Expression?: Yes Does the patient have breastfeeding experience prior to this delivery?: Yes  Feeding Feeding Type: Bottle Fed - Formula Nipple Type: Slow - flow  LATCH Score                   Interventions Interventions: Breast feeding basics reviewed;Coconut oil;Comfort gels;DEBP  Lactation Tools Discussed/Used Tools: Bottle WIC Program: Yes   Consult Status Consult Status: Complete    Danford Bad 08/15/2019, 10:30 AM

## 2019-08-15 NOTE — Discharge Instructions (Signed)
Please call your doctor or return to the ER if you experience any chest pains, shortness of breath, dizziness, visual changes, severe headache (unrelieved by pain meds), fever greater than 101, any heavy bleeding (saturating more than 1 pad per hour), large clots, or foul smelling discharge, any worsening abdominal pain and cramping that is not controlled by pain medication, any calf/leg pain or redness, any breast concerns (redness/pain), or any signs of postpartum depression. No tampons, enemas, douches, or sexual intercourse for 6 weeks. Also avoid tub baths, hot tubs, or swimming for 6 weeks.    Check your incision daily for any signs of infection such as redness, warmth, swelling, increased pain, or pus/foul smelling drainage   Activity: do not lift over 10-15 lbs for 6 weeks  No driving for 1-2 weeks  Pelvic rest for 6 weeks 

## 2019-08-15 NOTE — Progress Notes (Signed)
Post Partum Day 3 Subjective: Doing well, no complaints.  Tolerating regular diet, pain with PO meds, voiding and ambulating without difficulty. Feeling more incisional pain today than she has been. Gas pain has been well controlled, walking back and forth to nursery to see [name] frequently.   No CP SOB Fever,Chills, N/V or leg pain; denies nipple or breast pain no HA change of vision, RUQ/epigastric pain  Objective: BP 116/83 (BP Location: Left Arm)   Pulse 88   Temp 98 F (36.7 C) (Oral)   Resp 20   Ht 5\' 3"  (1.6 m)   Wt 97.1 kg   LMP 11/06/2018 (Approximate)   SpO2 98%   Breastfeeding Unknown   BMI 37.91 kg/m    Physical Exam:  General: NAD Breasts: soft/nontender- pumping, infant has been in and out of SCN for tachypnea.   CV: RRR Pulm: nl effort, CTABL Abdomen: soft, NT, BS x 4 Incision: Honey comb dsg intact, no erythema or drainage Lochia: scant Uterine Fundus: fundus firm and 2 fb below umbilicus DVT Evaluation: no cords, ttp LEs   Recent Labs    08/12/19 1313 08/13/19 0519  HGB 10.4* 9.5*  HCT 31.3* 29.5*  WBC 14.3* 12.2*  PLT 242 185    Assessment/Plan: 28 y.o. 10/13/19 postpartum day # 3  - Continue routine PP care- plan DC home today, infant may be able to room in. - Lactation consult prn- infant in SCN, mom pumping.  - BTL done with surgery.  - Acute blood loss anemia - hemodynamically stable and asymptomatic; continue po ferrous sulfate BID with stool softeners  - Immunization status:  all Imms up to date    Disposition: Does desire Dc home today.     [id number], CNM 08/15/2019  9:49 AM

## 2019-10-26 IMAGING — DX DG CHEST 2V
2 series · 2 of 2 positions shown · non-contrast
Comparison: Prior radiograph from 07/10/2015.

CLINICAL DATA: Initial evaluation for acute chest pain.

EXAM:
CHEST - 2 VIEW

[chest lat]
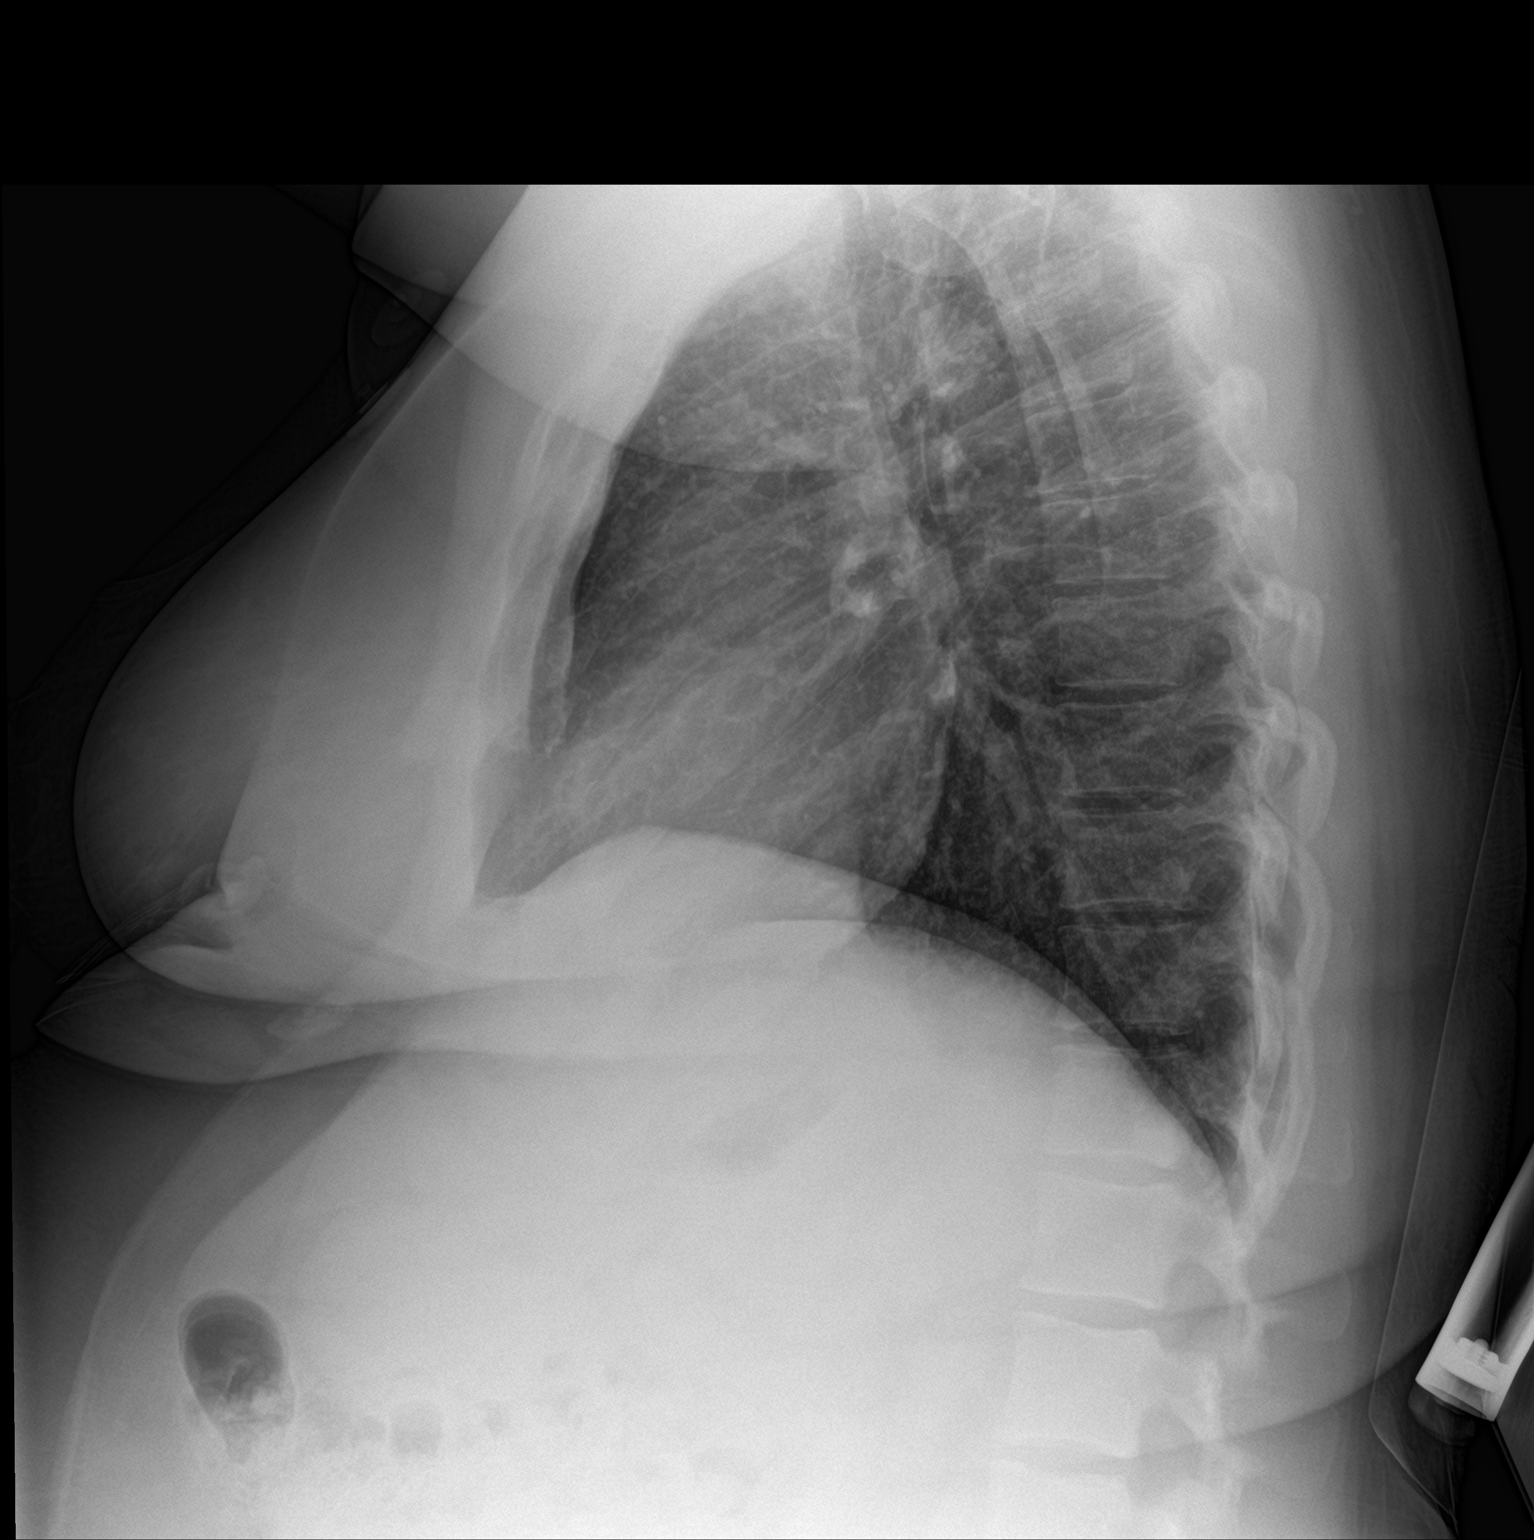

[chest ap]
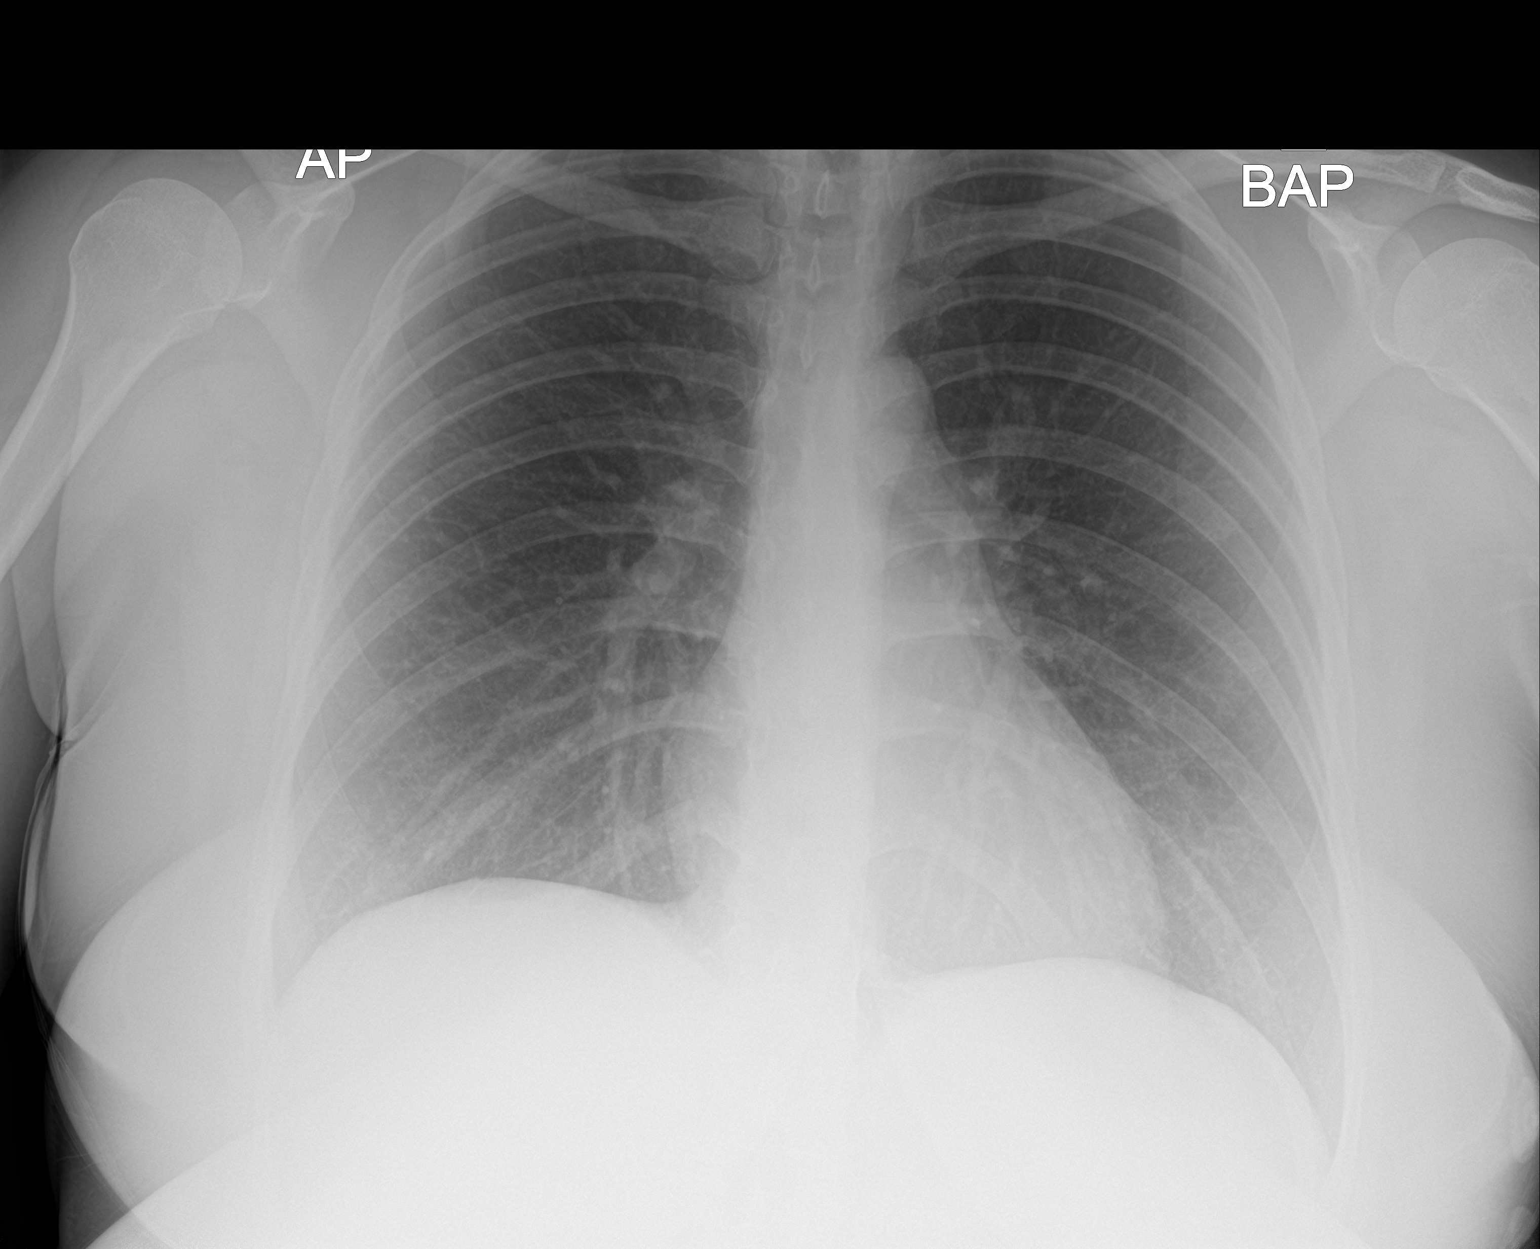

[2 of 2 positions shown; findings below may reference images not displayed]

FINDINGS: The cardiac and mediastinal silhouettes are stable in size and
contour, and remain within normal limits.

The lungs are normally inflated. No airspace consolidation, pleural
effusion, or pulmonary edema is identified. There is no
pneumothorax.

No acute osseous abnormality identified.
IMPRESSION: No active cardiopulmonary disease.

## 2019-10-31 ENCOUNTER — Other Ambulatory Visit: Payer: Self-pay | Admitting: Obstetrics and Gynecology

## 2019-11-05 NOTE — H&P (Signed)
Sonya [name] is a 29 y.o. female here for [facility] and bilateral salpingectomy  . Pt here for follow up for pursuing hysterectomy for menorrhagia failing conservative treatments in the past  ie ... ocps , depoProvera  IUD S/p svd x1 and LTCS and btl x1  EMBX with PROLIFERATIVE ENDOMETRIUM WITH CHRONIC ENDOMETRITIS. NO  HYPERPLASIA OR CARCINOMA  U/s today  Ut wnl  Endometrium=7.26 mm  bil ovs wnl  Pap: neg   Past Medical History:  has a past medical history of Allergy, Anemia, Anxiety, Bipolar disorder (CMS-HCC), Depression, Encounter for blood transfusion (12/27/14), GERD (gastroesophageal reflux disease), Headache, migraine, Inflammatory arthritis (08/13/2018), and Meniere's disease (07/06/2015).  Past Surgical History:  has a past surgical history that includes GANGLION CYST EXCISION (2010); Colonoscopy (10/25/2017); egd (10/25/2017); Tubal ligation (08/12/2019); and Cesarean section (08/12/2019). Family History: family history includes Alcohol abuse in her father; Arthritis in her mother; Bipolar disorder in her brother; Deep vein thrombosis (DVT or abnormal blood clot formation) in her maternal grandmother; Depression in her brother and sister; High blood pressure (Hypertension) in her mother; Lung cancer in her maternal aunt; Myocardial Infarction (Heart attack) in her father; Psoriasis in her mother; Thyroid disease in her mother. Social History:  reports that she has been smoking. She has been smoking about 0.00 packs per day for the past 0.00 years. She has never used smokeless tobacco. She reports previous alcohol use. She reports that she does not use drugs. OB/GYN History:          OB History    Gravida  3   Para  2   Term  2   Preterm      AB  1   Living  2     SAB  1   TAB      Ectopic      Molar      Multiple      Live Births  2          Allergies: is allergic to phentermine, codeine, and adhesive tape-silicones. Medications:  Current  Outpatient Medications:  .  cholecalciferol (VITAMIN D3) 2,000 unit capsule, Take 2,000 Units by mouth once daily   , Disp: , Rfl:  .  cyanocobalamin (VITAMIN B-12) 1000 MCG tablet, Take 1,000 mcg by mouth once daily   , Disp: , Rfl:  .  lamoTRIgine (LAMICTAL) 100 MG tablet, Take 250 mg by mouth once daily   , Disp: , Rfl:  .  metoprolol succinate (TOPROL-XL) 25 MG XL tablet, Take 12.5 mg by mouth once daily   , Disp: , Rfl:  .  sertraline (ZOLOFT) 100 MG tablet, Take 200 mg by mouth once daily   , Disp: , Rfl:  .  traZODone (DESYREL) 100 MG tablet, Take 100 mg by mouth nightly, Disp: , Rfl:  .  albuterol 90 mcg/actuation inhaler, Inhale 2 inhalations into the lungs every 6 (six) hours as needed for Wheezing, Disp: 8.5 g, Rfl: 2 .  aluminum & magnesium hydroxide-simethicone, DUH/DRH, (MYLANTA MAXIMUM) 400-400-40 mg/5 mL suspension, 30 cc qac and q hs (Patient not taking: Reported on 08/19/2019  ), Disp: 355 mL, Rfl: 2 .  aspirin 81 MG chewable tablet, Take 1 tablet (81 mg total) by mouth once daily (Patient not taking: Reported on 08/08/2019  ), Disp: 30 tablet, Rfl: 11 .  clonazePAM (KLONOPIN) 0.5 MG tablet, Take by mouth 3 (three) times a week   (Patient not taking: Reported on 06/20/2019  ), Disp: , Rfl:  .  ibuprofen (MOTRIN) 800 MG tablet, Take by mouth (Patient not taking: Reported on 09/24/2019  ), Disp: , Rfl:  .  iron,carb/vit C/vit B12/folic (IRON 100 PLUS ORAL), Take by mouth (Patient not taking: Reported on 09/24/2019  ), Disp: , Rfl:  .  lamoTRIgine (LAMICTAL) 25 MG tablet, Take 25 mg by mouth once daily (Patient not taking: Reported on 06/20/2019  ), Disp: , Rfl:  .  meclizine (ANTIVERT) 25 mg tablet, Take 25 mg by mouth as needed (Patient not taking: Reported on 06/20/2019  ), Disp: , Rfl: 0 .  metoclopramide (REGLAN) 5 MG tablet, Take by mouth (Patient not taking: Reported on 06/20/2019  ), Disp: , Rfl:  .  nicotine 21-14-7 mg/24 hr PTDS,    (Patient not taking: Reported on 06/20/2019), Disp: ,  Rfl:  .  ondansetron (ZOFRAN-ODT) 4 MG disintegrating tablet, TAKE 1 TABLET BY MOUTH EVERY 8 HOURS AS NEEDED FOR NAUSEA AND VOMITING (Patient not taking: Reported on 09/24/2019), Disp: 30 tablet, Rfl: 0 .  prenatal vit no.124-iron-folic (PRENATAL VITAMIN) 27 mg iron- 800 mcg Tab, Take by mouth (Patient not taking: Reported on 09/24/2019  ), Disp: , Rfl:  .  progesterone (PROMETRIUM) 200 MG capsule, Placed 1 tablet in the vagina nightly. (Patient not taking: Reported on 07/05/2019  ), Disp: 30 capsule, Rfl: 6 .  triamterene-hydrochlorothiazide (MAXZIDE-25) 37.5-25 mg tablet, Take 1 tablet by mouth once daily (Patient not taking: Reported on 06/20/2019  ), Disp: , Rfl:   Review of Systems: General:                      No fatigue or weight loss Eyes:                           No vision changes Ears:                            No hearing difficulty Respiratory:                No cough or shortness of breath Pulmonary:                  No asthma or shortness of breath Cardiovascular:           No chest pain, palpitations, dyspnea on exertion Gastrointestinal:          No abdominal bloating, chronic diarrhea, constipations, masses, pain or hematochezia Genitourinary:             No hematuria, dysuria, abnormal vaginal discharge, pelvic pain, Menometrorrhagia Lymphatic:                   No swollen lymph nodes Musculoskeletal:         No muscle weakness Neurologic:                  No extremity weakness, syncope, seizure disorder Psychiatric:                  No history of depression, delusions or suicidal/homicidal ideation    Exam:      Vitals:   11/05/19 1136  BP: 107/74  Pulse: 94    Body mass index is 36.4 kg/m.  WDWN white/  female in NAD   Lungs: CTA  CV : RRR without murmur   Neck:  no thyromegaly Abdomen: soft , no mass, normal active bowel sounds,  non-tender, no  rebound tenderness Pelvic: tanner stage 5 ,  External genitalia: vulva /labia no lesions Urethra: no  prolapse Vagina: normal physiologic d/c, adequate room for TVH if need be  Cervix: no lesions, no cervical motion tenderness   Uterus:10 weeks normal size shape and contour, non-tender Adnexa: no mass,  non-tender   Rectovaginal:  Impression:   The encounter diagnosis was Menorrhagia with regular cycle. Long history of failing conservative tx . She desires definitive tx.   Plan:   I have spoken with the patient regarding treatment options including expectant management, hormonal options, or surgical intervention.ie .Marland Kitchen TVH or LSH  After a full discussion the pt elects to proceed with [facility] and bilateral salpingectomy   Pro and cons discussed for each approach for hysterectomy . She is aware of the rare chance that if there is an occult cancer in the UTx that [facility] will upstage her cancer . Risks have been estimated 1/770-1:10,000 She understands these issues     Vilma Prader, MD

## 2019-11-08 ENCOUNTER — Encounter
Admission: RE | Admit: 2019-11-08 | Discharge: 2019-11-08 | Disposition: A | Discharge status: Home / Self Care | Payer: BLUE CROSS/BLUE SHIELD | Source: Ambulatory Visit | Attending: Obstetrics and Gynecology | Admitting: Obstetrics and Gynecology

## 2019-11-08 ENCOUNTER — Other Ambulatory Visit: Payer: Self-pay

## 2019-11-08 HISTORY — DX: Other complications of anesthesia, initial encounter: T88.59XA

## 2019-11-08 NOTE — Patient Instructions (Signed)
Your procedure is scheduled on: Monday November 18, 2019. Report to Day Surgery inside [facility] 2nd floor. To find out your arrival time please call [phone] between 1PM - 3PM on Friday November 15, 2019.  Remember: Instructions that are not followed completely may result in serious medical risk,  up to and including death, or upon the discretion of your surgeon and anesthesiologist your  surgery may need to be rescheduled.     _X__ 1. Do not eat food after midnight the night before your procedure.                 No chewing gum or hard candies. You may drink clear liquids up to 2 hours                 before you are scheduled to arrive for your surgery- DO not drink clear                 liquids within 2 hours of the start of your surgery.                 Clear Liquids include:  water, apple juice without pulp, clear Gatorade, G2 or                  Gatorade Zero (avoid Red/Purple/Blue), Black Coffee or Tea (Do not add                 anything to coffee or tea).  __X__2.   Complete the "Ensure Clear Pre-surgery Clear Carbohydrate Drink" provided to you, 2 hours before arrival. If you are diabetic you will be provided with an alternative drink, Gatorade Zero or G2.  __X__3.  On the morning of surgery brush your teeth with toothpaste and water, you                may rinse your mouth with mouthwash if you wish.  Do not swallow any toothpaste of mouthwash.     _X__ 4.  No Alcohol for 24 hours before or after surgery.   _X__ 5.  Do Not Smoke or use e-cigarettes For 24 Hours Prior to Your Surgery.                 Do not use any chewable tobacco products for at least 6 hours prior to                 Surgery.  _X__  6.  Do not use any recreational drugs (marijuana, cocaine, heroin, ecstasy, MDMA or other)                For at least one week prior to your surgery.  Combination of these drugs with anesthesia                May have life threatening  results.  __X__7.  Notify your doctor if there is any change in your medical condition      (cold, fever, infections).     Do not wear jewelry, make-up, hairpins, clips or nail polish. Do not wear lotions, powders, or perfumes. You may wear deodorant. Do not shave 48 hours prior to surgery. Men may shave face and neck. Do not bring valuables to the hospital.    [facility] is not responsible for any belongings or valuables.  Contacts, dentures or bridgework may not be worn into surgery. Leave your suitcase in the car. After surgery it may be brought to your room. For patients admitted  to the hospital, discharge time is determined by your treatment team.   Patients discharged the day of surgery will not be allowed to drive home.   Make arrangements for someone to be with you for the first 24 hours of your Same Day Discharge.    Please read over the following fact sheets that you were given:   Incentive Spirometer     ____ Take these medicines the morning of surgery with A SIP OF WATER:    1. None   __X__ Use CHG Soap (or wipes) as directed  __X__ Stop Anti-inflammatories on such as ibuprofen (ADVIL), Aleve, naproxen, aspirin and or BC powders.    __X__ Stop supplements until after surgery.    __X__ Do not start any herbal supplements before your surgery.    If you have any questions regarding your pre-procedure instructions,  Please call Pre-admit Testing at [phone].

## 2019-11-14 ENCOUNTER — Other Ambulatory Visit: Payer: Self-pay

## 2019-11-14 ENCOUNTER — Other Ambulatory Visit
Admission: RE | Admit: 2019-11-14 | Discharge: 2019-11-14 | Disposition: A | Discharge status: Home / Self Care | Payer: BLUE CROSS/BLUE SHIELD | Source: Ambulatory Visit | Attending: Obstetrics and Gynecology | Admitting: Obstetrics and Gynecology

## 2019-11-14 DIAGNOSIS — Z20822 Contact with and (suspected) exposure to covid-19: Secondary | ICD-10-CM | POA: Insufficient documentation

## 2019-11-14 DIAGNOSIS — Z01812 Encounter for preprocedural laboratory examination: Secondary | ICD-10-CM | POA: Insufficient documentation

## 2019-11-14 LAB — CBC
HCT: 38.3 % (ref 36.0–46.0)
Hemoglobin: 12.4 g/dL (ref 12.0–15.0)
MCH: 27.6 pg (ref 26.0–34.0)
MCHC: 32.4 g/dL (ref 30.0–36.0)
MCV: 85.3 fL (ref 80.0–100.0)
Platelets: 258 10*3/uL (ref 150–400)
RBC: 4.49 MIL/uL (ref 3.87–5.11)
RDW: 14.6 % (ref 11.5–15.5)
WBC: 10 10*3/uL (ref 4.0–10.5)
nRBC: 0 % (ref 0.0–0.2)

## 2019-11-14 LAB — BASIC METABOLIC PANEL
Anion gap: 9 (ref 5–15)
BUN: 12 mg/dL (ref 6–20)
CO2: 27 mmol/L (ref 22–32)
Calcium: 9.5 mg/dL (ref 8.9–10.3)
Chloride: 102 mmol/L (ref 98–111)
Creatinine, Ser: 0.59 mg/dL (ref 0.44–1.00)
GFR calc Af Amer: >60 mL/min (ref >60)
GFR calc non Af Amer: >60 mL/min (ref >60)
Glucose, Bld: 115 mg/dL — ABNORMAL HIGH (ref 70–99)
Potassium: 3.5 mmol/L (ref 3.5–5.1)
Sodium: 138 mmol/L (ref 135–145)

## 2019-11-15 LAB — SARS CORONAVIRUS 2 (TAT 6-24 HRS): SARS Coronavirus 2: NEGATIVE

## 2019-11-18 ENCOUNTER — Encounter: Admission: RE | Payer: Self-pay | Source: Home / Self Care

## 2019-11-18 ENCOUNTER — Ambulatory Visit
Admission: RE | Admit: 2019-11-18 | Payer: BLUE CROSS/BLUE SHIELD | Source: Home / Self Care | Admitting: Obstetrics and Gynecology

## 2019-11-18 SURGERY — HYSTERECTOMY, SUPRACERVICAL, LAPAROSCOPIC
Anesthesia: General

## 2019-11-19 LAB — TYPE AND SCREEN
ABO/RH(D): O POS
Antibody Screen: POSITIVE
Unit division: 0

## 2019-11-19 LAB — BPAM RBC
Blood Product Expiration Date: 202110142359
Unit Type and Rh: 5100

## 2019-11-23 IMAGING — CT CT HEAD W/O CM
3 of 4 series · 15 of 47 positions shown, 18 images · non-contrast
Comparison: CT of the head performed 06/05/2013

CLINICAL DATA: Acute onset of dizziness and tachycardia.
Disorientation.

EXAM:
CT HEAD WITHOUT CONTRAST
TECHNIQUE: Contiguous axial images were obtained from the base of the skull
through the vertex without intravenous contrast.

[Series 2: head wo · axial · 0.40mm/px · z∈[-100,+15]mm · 9 of 27 slices shown, 12 images]
[im 2/27  brain]
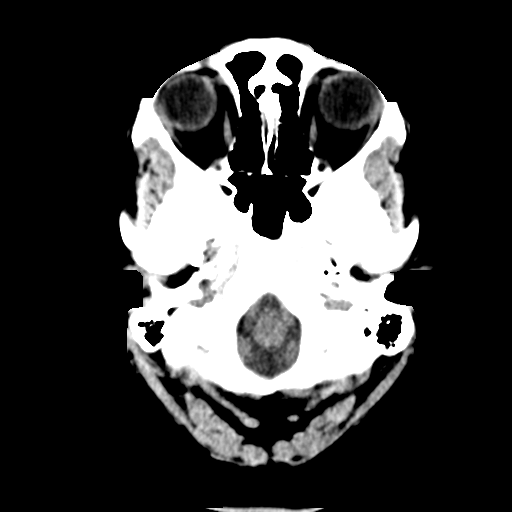
[im 2/27  bone]
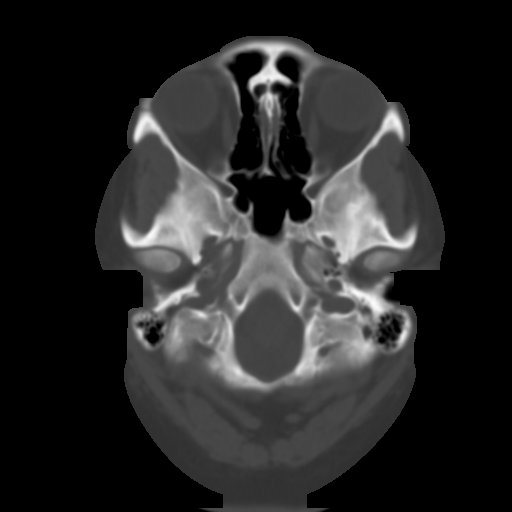
[im 6/27  brain]
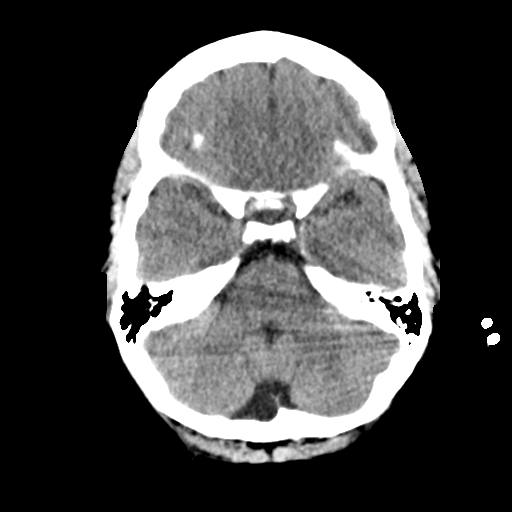
[im 8/27  brain]
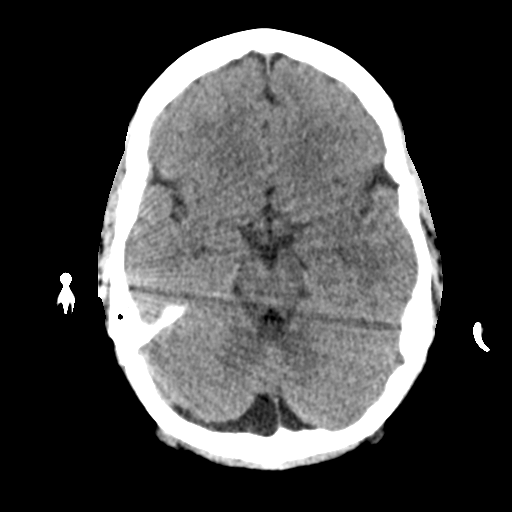
[im 12/27  brain]
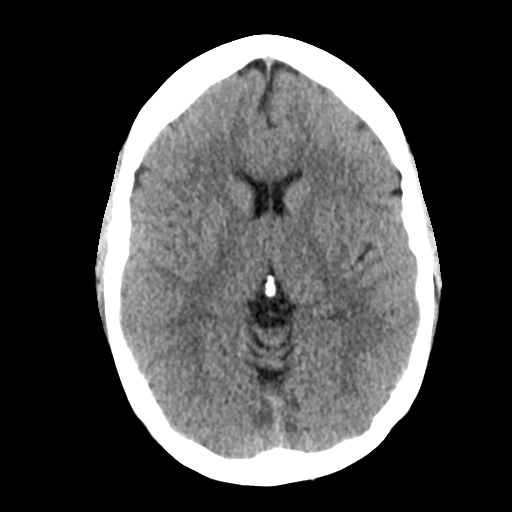
[im 14/27  brain]
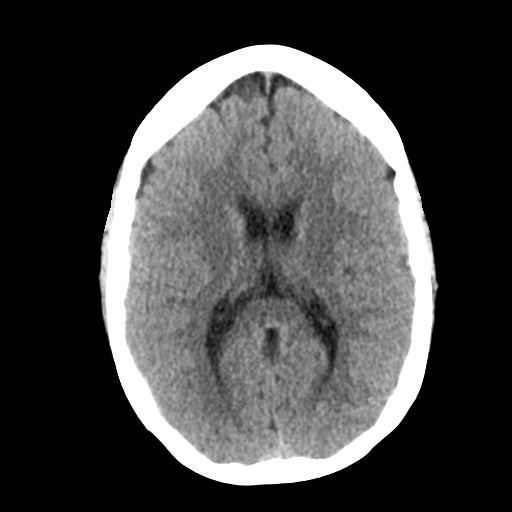
[im 14/27  bone]
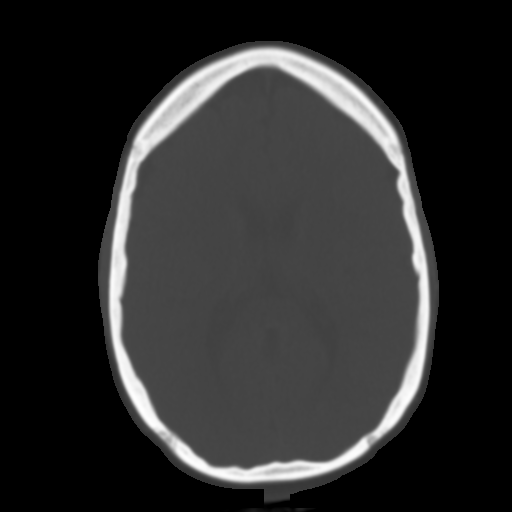
[im 15/27  brain]
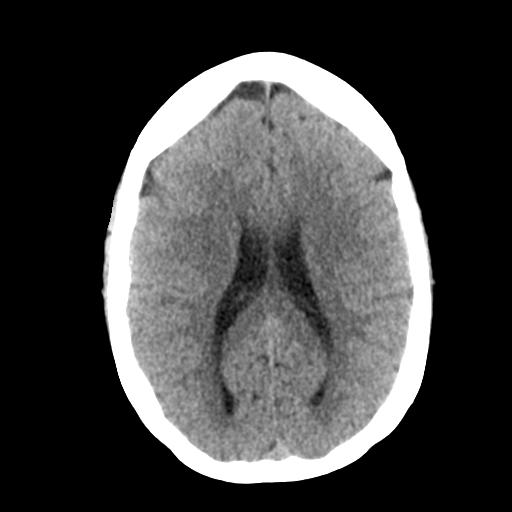
[im 19/27  brain]
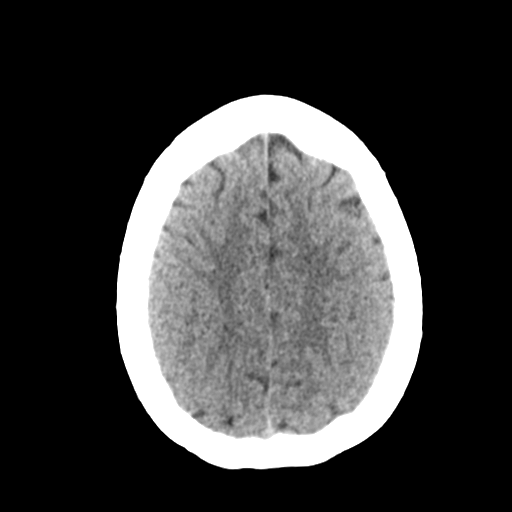
[im 21/27  brain]
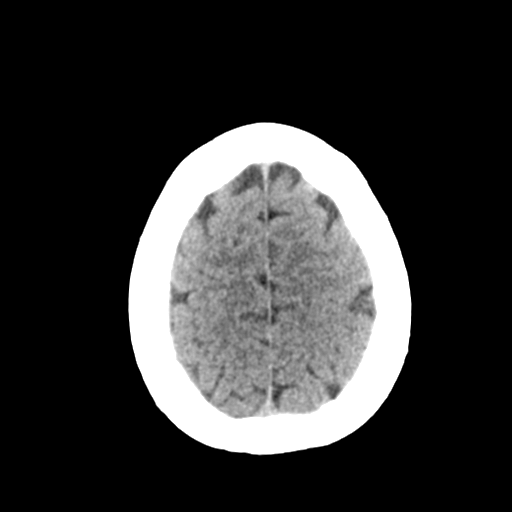
[im 25/27  brain]
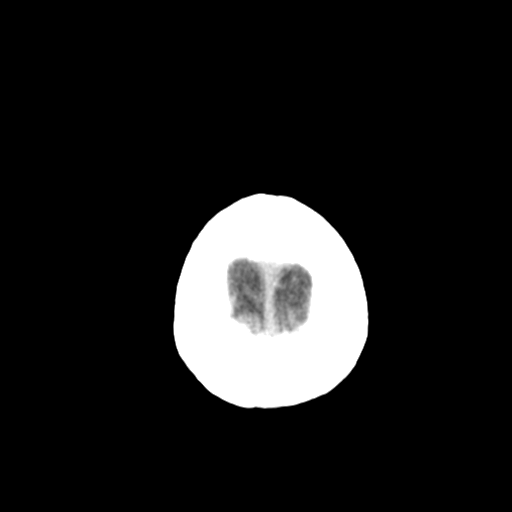
[im 25/27  bone]
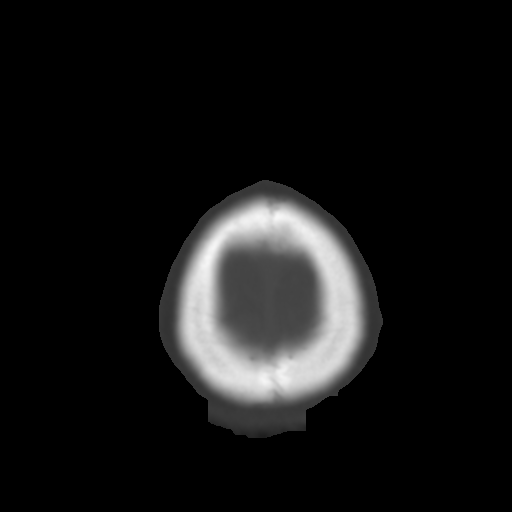

[Series 5: coronal soft tissue · coronal · 0.31mm/px · 3 of 65 slices shown]
[im 22/65  brain]
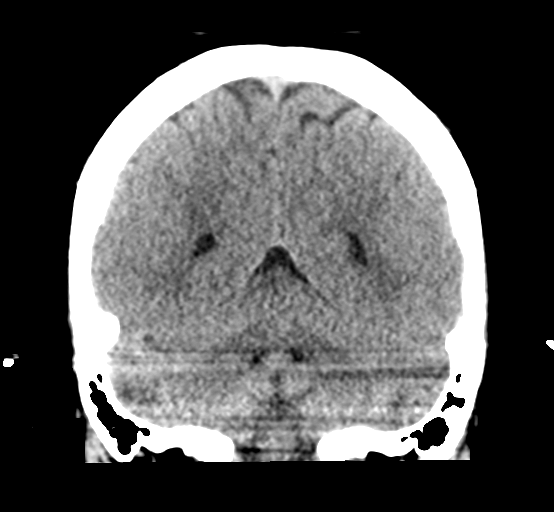
[im 29/65  brain]
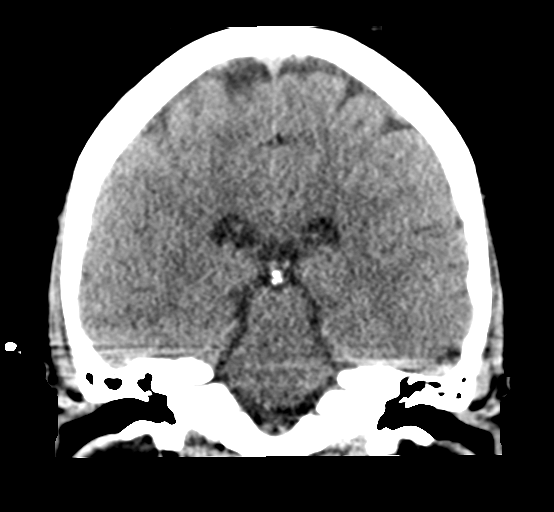
[im 36/65  brain]
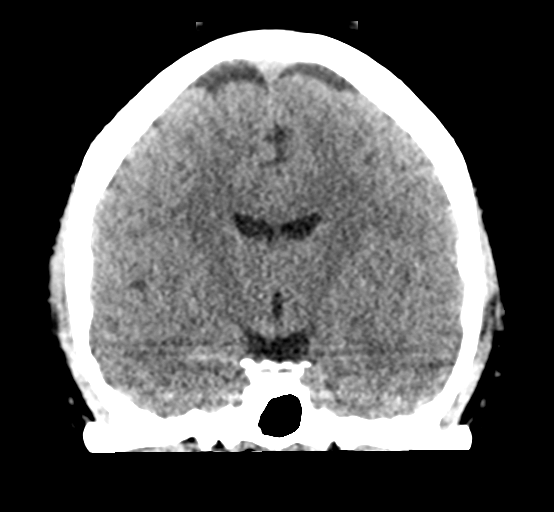

[Series 6: sagittal soft tissue · sagittal · 0.31mm/px · 3 of 52 slices shown]
[im 18/52  brain]
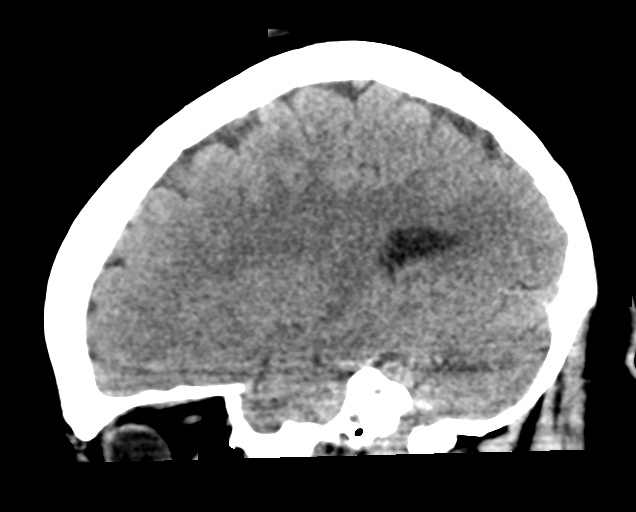
[im 26/52  brain]
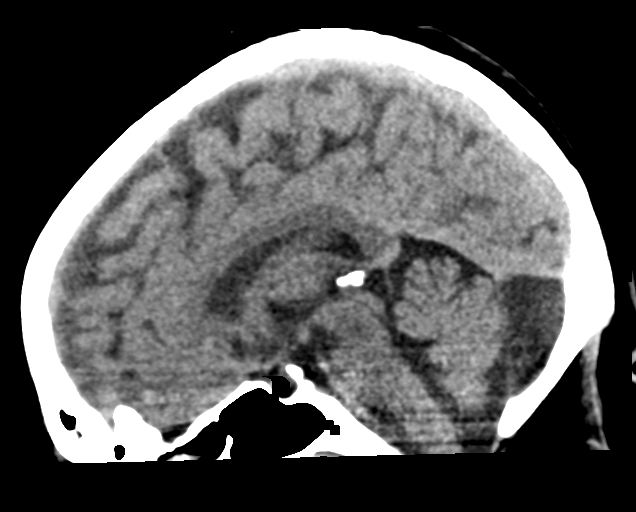
[im 35/52  brain]
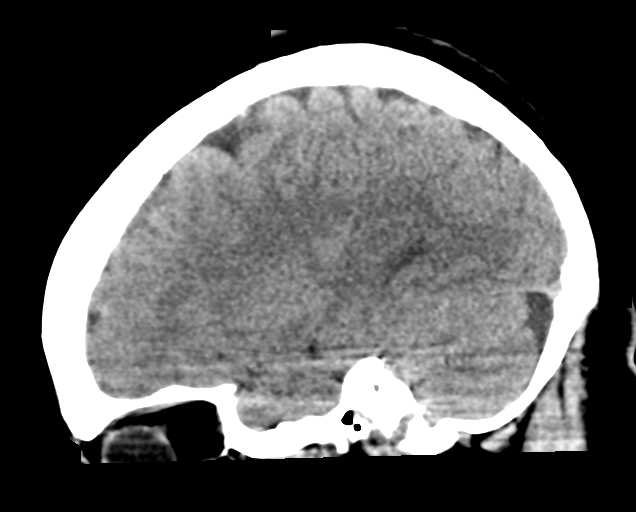

[15 of 47 positions shown; findings below may reference images not displayed]

FINDINGS: Brain: No evidence of acute infarction, hemorrhage, hydrocephalus,
extra-axial collection or mass lesion/mass effect.

The posterior fossa, including the cerebellum, brainstem and fourth
ventricle, is within normal limits. The third and lateral
ventricles, and basal ganglia are unremarkable in appearance. The
cerebral hemispheres are symmetric in appearance, with normal
gray-white differentiation. No mass effect or midline shift is seen.

Vascular: No hyperdense vessel or unexpected calcification.

Skull: There is no evidence of fracture; visualized osseous
structures are unremarkable in appearance.

Sinuses/Orbits: The visualized portions of the orbits are within
normal limits. The paranasal sinuses and mastoid air cells are
well-aerated.

Other: No significant soft tissue abnormalities are seen.
IMPRESSION: Unremarkable noncontrast CT of the head.

## 2019-12-13 NOTE — H&P (Signed)
[name] 29 [facility]  . Pt here for follow up for pursuing hysterectomy for menorrhagia failing conservative treatments in the past ie ... ocps , depoProvera IUD S/p svd x1 and LTCS and btl x1  EMBXwith PROLIFERATIVE ENDOMETRIUM WITH CHRONIC ENDOMETRITIS. NO  HYPERPLASIA OR CARCINOMA  U/s8/4/21:   Ut wnl  Endometrium=7.26 mm  bil ovs wnl  Pap: neg  Past Medical History:has a past medical history of Allergy, Anemia, Anxiety, Bipolar disorder (CMS-HCC), Depression, Encounter for blood transfusion (12/27/14), GERD (gastroesophageal reflux disease), Headache, migraine, Inflammatory arthritis (08/13/2018), and Meniere's disease (07/06/2015). Past Surgical History:has a past surgical history that includes GANGLION CYST EXCISION (2010); Colonoscopy (10/25/2017); egd (10/25/2017); Tubal ligation (08/12/2019); and Cesarean section (08/12/2019). Family History:family history includes Alcohol abuse in her father; Arthritis in her mother; Bipolar disorder in her brother; Deep vein thrombosis (DVT or abnormal blood clot formation) in her maternal grandmother; Depression in her brother and sister; High blood pressure (Hypertension) in her mother; Lung cancer in her maternal aunt; Myocardial Infarction (Heart attack) in her father; Psoriasis in her mother; Thyroid disease in her mother. Social History:reports that she has been smoking. She has been smoking about 0.00 packs per day for the past 0.00 years. She has never used smokeless tobacco. She reports previous alcohol use. She reports that she does not use drugs. OB/GYN History:                           OB History     Gravida  3   Para  2   Term  2   Preterm     AB  1   Living  2     SAB  1   TAB     Ectopic     Molar     Multiple     Live Births  2          Allergies:is allergic to phentermine, codeine, and adhesive  tape-silicones. Medications:  Current Outpatient Medications:  .cholecalciferol (VITAMIN D3) 2,000 unit capsule, Take 2,000 Units by mouth once daily , Disp: , Rfl:  .cyanocobalamin (VITAMIN B-12) 1000 MCG tablet, Take 1,000 mcg by mouth once daily , Disp: , Rfl:  .lamoTRIgine (LAMICTAL) 100 MG tablet, Take 250 mg by mouth once daily , Disp: , Rfl:  .metoprolol succinate (TOPROL-XL) 25 MG XL tablet, Take 12.5 mg by mouth once daily , Disp: , Rfl:  .sertraline (ZOLOFT) 100 MG tablet, Take 200 mg by mouth once daily , Disp: , Rfl:  .traZODone (DESYREL) 100 MG tablet, Take 100 mg by mouth nightly, Disp: , Rfl:  .albuterol 90 mcg/actuation inhaler, Inhale 2 inhalations into the lungs every 6 (six) hours as needed for Wheezing, Disp: 8.5 g, Rfl: 2 .aluminum &magnesium hydroxide-simethicone, DUH/DRH, (MYLANTA MAXIMUM) 400-400-40 mg/5 mL suspension, 30 cc qac and q hs (Patient not taking: Reported on 08/19/2019 ), Disp: 355 mL, Rfl: 2 .aspirin 81 MG chewable tablet, Take 1 tablet (81 mg total) by mouth once daily (Patient not taking: Reported on 08/08/2019 ), Disp: 30 tablet, Rfl: 11 .clonazePAM (KLONOPIN) 0.5 MG tablet, Take by mouth 3 (three) times a week (Patient not taking: Reported on 06/20/2019 ), Disp: , Rfl:  .ibuprofen (MOTRIN) 800 MG tablet, Take by mouth (Patient not taking: Reported on 09/24/2019 ), Disp: , Rfl:  .iron,carb/vit C/vit B12/folic (IRON 100 PLUS ORAL), Take by mouth (Patient not taking: Reported on 09/24/2019 ), Disp: , Rfl:  .lamoTRIgine (LAMICTAL)  25 MG tablet, Take 25 mg by mouth once daily (Patient not taking: Reported on 06/20/2019 ), Disp: , Rfl:  .meclizine (ANTIVERT) 25 mg tablet, Take 25 mg by mouth as needed (Patient not taking: Reported on 06/20/2019 ), Disp: , Rfl: 0 .metoclopramide (REGLAN) 5 MG tablet, Take by mouth (Patient not taking: Reported on 06/20/2019 ), Disp: , Rfl:  .nicotine 21-14-7 mg/24 hr PTDS, (Patient  not taking: Reported on 06/20/2019), Disp: , Rfl:  .ondansetron (ZOFRAN-ODT) 4 MG disintegrating tablet, TAKE 1 TABLET BY MOUTH EVERY 8 HOURS AS NEEDED FOR NAUSEA AND VOMITING (Patient not taking: Reported on 09/24/2019), Disp: 30 tablet, Rfl: 0 .prenatal vit no.124-iron-folic (PRENATAL VITAMIN) 27 mg iron- 800 mcg Tab, Take by mouth (Patient not taking: Reported on 09/24/2019 ), Disp: , Rfl:  .progesterone (PROMETRIUM) 200 MG capsule, Placed 1 tablet in the vagina nightly. (Patient not taking: Reported on 07/05/2019 ), Disp: 30 capsule, Rfl: 6 .triamterene-hydrochlorothiazide (MAXZIDE-25) 37.5-25 mg tablet, Take 1 tablet by mouth once daily (Patient not taking: Reported on 06/20/2019 ), Disp: , Rfl:   Review of Systems: General: No fatigue or weightloss Eyes:No vision changes Ears:No hearing difficulty Respiratory:No cough or shortness of breath Pulmonary: No asthma or shortness of breath Cardiovascular:No chest pain, palpitations, dyspnea on exertion Gastrointestinal:No abdominal bloating, chronic diarrhea, constipations, masses, pain or hematochezia Genitourinary:No hematuria, dysuria, abnormal vaginal discharge, pelvic pain, Menometrorrhagia, + menorrhagia Lymphatic:No swollen lymph nodes Musculoskeletal:No muscle weakness Neurologic:No extremity weakness, syncope, seizure disorder Psychiatric:No history of depression, delusions or suicidal/homicidal ideation   Exam:      Vitals:   12/16/19  BP: 107/74  Pulse: 94    Body mass index is 36.4 kg/m.  WDWN white/ female in NAD  Lungs: CTA  CV: RRR without murmur  Neck: no thyromegaly Abdomen: soft , no mass, normal active bowel sounds, non-tender, no rebound tenderness Pelvic: tanner stage 5 ,   External genitalia: vulva /labia no lesions Urethra: no prolapse Vagina: normal physiologic d/c, adequate room for TVH if need be Cervix: no lesions, no cervical motion tenderness  Uterus:10 weeksnormal size shape and contour, non-tender Adnexa:no mass, non-tender  Rectovaginal:  Impression:   The encounter diagnosis was Menorrhagia with regular cycle. Long history of failing conservative tx . She desires definitive tx.   Plan:   I have spoken with the patient regarding treatment options including expectant management, hormonal options, or surgical intervention.ie .Marland Kitchen TVH or LSHAfter a full discussion the pt elects to proceed with [facility] and bilateral salpingectomy   Pro and cons discussed for each approach for hysterectomy . She is aware of the rare chance that if there is an occult cancer in the UTx that [facility] will upstage her cancer . Risks have been estimated 1/770-1:10,000 She understands these issues    Vilma Prader, MD

## 2019-12-25 ENCOUNTER — Other Ambulatory Visit: Payer: Self-pay

## 2019-12-25 ENCOUNTER — Other Ambulatory Visit
Admission: RE | Admit: 2019-12-25 | Discharge: 2019-12-25 | Disposition: A | Discharge status: Home / Self Care | Payer: BLUE CROSS/BLUE SHIELD | Source: Ambulatory Visit | Attending: Obstetrics and Gynecology | Admitting: Obstetrics and Gynecology

## 2019-12-25 DIAGNOSIS — Z20822 Contact with and (suspected) exposure to covid-19: Secondary | ICD-10-CM | POA: Insufficient documentation

## 2019-12-25 DIAGNOSIS — Z01812 Encounter for preprocedural laboratory examination: Secondary | ICD-10-CM | POA: Insufficient documentation

## 2019-12-25 LAB — SARS CORONAVIRUS 2 (TAT 6-24 HRS): SARS Coronavirus 2: NEGATIVE

## 2019-12-26 MED ORDER — GABAPENTIN 300 MG PO CAPS: 300.0000 mg | ORAL_CAPSULE | ORAL | Status: AC

## 2019-12-26 MED ORDER — ACETAMINOPHEN 500 MG PO TABS: 1000.0000 mg | ORAL_TABLET | ORAL | Status: AC

## 2019-12-26 MED ORDER — LACTATED RINGERS IV SOLN: INTRAVENOUS | Status: DC

## 2019-12-26 MED ORDER — CEFAZOLIN SODIUM-DEXTROSE 2-4 GM/100ML-% IV SOLN
2.0000 g | Freq: Once | INTRAVENOUS | Status: AC
  Administered 2019-12-27: 2 g via INTRAVENOUS

## 2019-12-26 MED ORDER — FAMOTIDINE 20 MG PO TABS: 20.0000 mg | ORAL_TABLET | Freq: Once | ORAL | Status: AC

## 2019-12-26 MED ORDER — ORAL CARE MOUTH RINSE: 15.0000 mL | Freq: Once | OROMUCOSAL | Status: AC

## 2019-12-26 MED ORDER — POVIDONE-IODINE 10 % EX SWAB
2.0000 "application " | Freq: Once | CUTANEOUS | Status: AC
  Administered 2019-12-27: 2 via TOPICAL

## 2019-12-26 MED ORDER — CHLORHEXIDINE GLUCONATE 0.12 % MT SOLN: 15.0000 mL | Freq: Once | OROMUCOSAL | Status: AC

## 2019-12-27 ENCOUNTER — Other Ambulatory Visit: Payer: Self-pay

## 2019-12-27 ENCOUNTER — Encounter
Admission: RE | Disposition: A | Discharge status: Home / Self Care | Payer: Self-pay | Source: Home / Self Care | Attending: Obstetrics and Gynecology

## 2019-12-27 ENCOUNTER — Encounter: Payer: Self-pay | Admitting: Obstetrics and Gynecology

## 2019-12-27 ENCOUNTER — Ambulatory Visit: Payer: BLUE CROSS/BLUE SHIELD | Admitting: Registered Nurse

## 2019-12-27 ENCOUNTER — Ambulatory Visit
Admission: RE | Admit: 2019-12-27 | Discharge: 2019-12-27 | Disposition: A | Discharge status: Home / Self Care | Payer: BLUE CROSS/BLUE SHIELD | Attending: Obstetrics and Gynecology | Admitting: Obstetrics and Gynecology

## 2019-12-27 DIAGNOSIS — N800 Endometriosis of uterus: Secondary | ICD-10-CM | POA: Diagnosis not present

## 2019-12-27 DIAGNOSIS — N920 Excessive and frequent menstruation with regular cycle: Secondary | ICD-10-CM | POA: Diagnosis not present

## 2019-12-27 DIAGNOSIS — Z885 Allergy status to narcotic agent status: Secondary | ICD-10-CM | POA: Insufficient documentation

## 2019-12-27 DIAGNOSIS — K219 Gastro-esophageal reflux disease without esophagitis: Secondary | ICD-10-CM | POA: Insufficient documentation

## 2019-12-27 DIAGNOSIS — F319 Bipolar disorder, unspecified: Secondary | ICD-10-CM | POA: Insufficient documentation

## 2019-12-27 DIAGNOSIS — F17210 Nicotine dependence, cigarettes, uncomplicated: Secondary | ICD-10-CM | POA: Diagnosis not present

## 2019-12-27 DIAGNOSIS — Z79899 Other long term (current) drug therapy: Secondary | ICD-10-CM | POA: Insufficient documentation

## 2019-12-27 DIAGNOSIS — F419 Anxiety disorder, unspecified: Secondary | ICD-10-CM | POA: Insufficient documentation

## 2019-12-27 DIAGNOSIS — R062 Wheezing: Secondary | ICD-10-CM | POA: Insufficient documentation

## 2019-12-27 DIAGNOSIS — N736 Female pelvic peritoneal adhesions (postinfective): Secondary | ICD-10-CM | POA: Diagnosis not present

## 2019-12-27 DIAGNOSIS — M1380 Other specified arthritis, unspecified site: Secondary | ICD-10-CM | POA: Insufficient documentation

## 2019-12-27 HISTORY — PX: LAPAROSCOPIC SUPRACERVICAL HYSTERECTOMY: SHX5399

## 2019-12-27 HISTORY — PX: LAPAROSCOPIC BILATERAL SALPINGECTOMY: SHX5889

## 2019-12-27 LAB — CBC
HCT: 38 % (ref 36.0–46.0)
Hemoglobin: 12.4 g/dL (ref 12.0–15.0)
MCH: 27.6 pg (ref 26.0–34.0)
MCHC: 32.6 g/dL (ref 30.0–36.0)
MCV: 84.6 fL (ref 80.0–100.0)
Platelets: 249 10*3/uL (ref 150–400)
RBC: 4.49 MIL/uL (ref 3.87–5.11)
RDW: 14.4 % (ref 11.5–15.5)
WBC: 11.6 10*3/uL — ABNORMAL HIGH (ref 4.0–10.5)
nRBC: 0 % (ref 0.0–0.2)

## 2019-12-27 LAB — TYPE AND SCREEN
ABO/RH(D): O POS
Antibody Screen: POSITIVE
Unit division: 0
Unit division: 0

## 2019-12-27 LAB — BPAM RBC
Blood Product Expiration Date: 202111142359
Blood Product Expiration Date: 202111142359
Unit Type and Rh: 5100
Unit Type and Rh: 5100

## 2019-12-27 LAB — BASIC METABOLIC PANEL
Anion gap: 8 (ref 5–15)
BUN: 10 mg/dL (ref 6–20)
CO2: 25 mmol/L (ref 22–32)
Calcium: 9.3 mg/dL (ref 8.9–10.3)
Chloride: 105 mmol/L (ref 98–111)
Creatinine, Ser: 0.53 mg/dL (ref 0.44–1.00)
GFR, Estimated: >60 mL/min (ref >60)
Glucose, Bld: 97 mg/dL (ref 70–99)
Potassium: 4.5 mmol/L (ref 3.5–5.1)
Sodium: 138 mmol/L (ref 135–145)

## 2019-12-27 LAB — POCT PREGNANCY, URINE: Preg Test, Ur: NEGATIVE

## 2019-12-27 SURGERY — HYSTERECTOMY, SUPRACERVICAL, LAPAROSCOPIC
Anesthesia: General

## 2019-12-27 MED ORDER — OXYCODONE-ACETAMINOPHEN 5-325 MG PO TABS: 1.0000 | ORAL_TABLET | ORAL | Status: DC | PRN

## 2019-12-27 MED ORDER — OXYCODONE-ACETAMINOPHEN 5-325 MG PO TABS
ORAL_TABLET | ORAL | Status: AC
  Filled 2019-12-27: qty 1

## 2019-12-27 MED ORDER — FAMOTIDINE 20 MG PO TABS
ORAL_TABLET | ORAL | Status: AC
  Administered 2019-12-27: 20 mg via ORAL
  Filled 2019-12-27: qty 1

## 2019-12-27 MED ORDER — LIDOCAINE HCL (PF) 2 % IJ SOLN
INTRAMUSCULAR | Status: AC
  Filled 2019-12-27: qty 10

## 2019-12-27 MED ORDER — DEXMEDETOMIDINE HCL 200 MCG/2ML IV SOLN
INTRAVENOUS | Status: DC | PRN
  Administered 2019-12-27: 12 ug via INTRAVENOUS
  Administered 2019-12-27: 8 ug via INTRAVENOUS

## 2019-12-27 MED ORDER — SILVER NITRATE-POT NITRATE 75-25 % EX MISC
CUTANEOUS | Status: DC | PRN
  Administered 2019-12-27: 2

## 2019-12-27 MED ORDER — ONDANSETRON HCL 4 MG/2ML IJ SOLN
INTRAMUSCULAR | Status: AC
  Filled 2019-12-27: qty 2

## 2019-12-27 MED ORDER — PROPOFOL 500 MG/50ML IV EMUL
INTRAVENOUS | Status: AC
  Filled 2019-12-27: qty 50

## 2019-12-27 MED ORDER — SILVER NITRATE-POT NITRATE 75-25 % EX MISC
CUTANEOUS | Status: AC
  Filled 2019-12-27: qty 10

## 2019-12-27 MED ORDER — LIDOCAINE HCL (CARDIAC) PF 100 MG/5ML IV SOSY
PREFILLED_SYRINGE | INTRAVENOUS | Status: DC | PRN
  Administered 2019-12-27: 100 mg via INTRAVENOUS
  Administered 2019-12-27 (×2): 40 mg via INTRAVENOUS

## 2019-12-27 MED ORDER — SUGAMMADEX SODIUM 200 MG/2ML IV SOLN
INTRAVENOUS | Status: DC | PRN
  Administered 2019-12-27: 200 mg via INTRAVENOUS

## 2019-12-27 MED ORDER — BUPIVACAINE HCL (PF) 0.5 % IJ SOLN
INTRAMUSCULAR | Status: AC
  Filled 2019-12-27: qty 30

## 2019-12-27 MED ORDER — BUPIVACAINE HCL 0.5 % IJ SOLN
INTRAMUSCULAR | Status: DC | PRN
  Administered 2019-12-27: 13 mL

## 2019-12-27 MED ORDER — FENTANYL CITRATE (PF) 100 MCG/2ML IJ SOLN
INTRAMUSCULAR | Status: DC | PRN
  Administered 2019-12-27: 25 ug via INTRAVENOUS
  Administered 2019-12-27: 50 ug via INTRAVENOUS
  Administered 2019-12-27: 25 ug via INTRAVENOUS

## 2019-12-27 MED ORDER — PROPOFOL 500 MG/50ML IV EMUL
INTRAVENOUS | Status: AC
  Filled 2019-12-27: qty 100

## 2019-12-27 MED ORDER — EPINEPHRINE PF 1 MG/ML IJ SOLN
INTRAMUSCULAR | Status: AC
  Filled 2019-12-27: qty 1

## 2019-12-27 MED ORDER — ONDANSETRON HCL 4 MG/2ML IJ SOLN
4.0000 mg | Freq: Once | INTRAMUSCULAR | Status: AC | PRN
  Administered 2019-12-27: 4 mg via INTRAVENOUS

## 2019-12-27 MED ORDER — DEXMEDETOMIDINE (PRECEDEX) IN NS 20 MCG/5ML (4 MCG/ML) IV SYRINGE
PREFILLED_SYRINGE | INTRAVENOUS | Status: AC
  Filled 2019-12-27: qty 5

## 2019-12-27 MED ORDER — ACETAMINOPHEN 500 MG PO TABS
ORAL_TABLET | ORAL | Status: AC
  Administered 2019-12-27: 1000 mg via ORAL
  Filled 2019-12-27: qty 2

## 2019-12-27 MED ORDER — OXYCODONE-ACETAMINOPHEN 5-325 MG PO TABS
1.0000 | ORAL_TABLET | Freq: Once | ORAL | Status: AC
  Administered 2019-12-27: 1 via ORAL

## 2019-12-27 MED ORDER — DIPHENHYDRAMINE HCL 50 MG/ML IJ SOLN
INTRAMUSCULAR | Status: AC
  Filled 2019-12-27: qty 1

## 2019-12-27 MED ORDER — ROCURONIUM BROMIDE 100 MG/10ML IV SOLN
INTRAVENOUS | Status: DC | PRN
  Administered 2019-12-27: 20 mg via INTRAVENOUS
  Administered 2019-12-27: 10 mg via INTRAVENOUS
  Administered 2019-12-27: 60 mg via INTRAVENOUS

## 2019-12-27 MED ORDER — CEFAZOLIN SODIUM-DEXTROSE 2-4 GM/100ML-% IV SOLN
INTRAVENOUS | Status: AC
  Filled 2019-12-27: qty 100

## 2019-12-27 MED ORDER — DIPHENHYDRAMINE HCL 50 MG/ML IJ SOLN
INTRAMUSCULAR | Status: DC | PRN
  Administered 2019-12-27: 12.5 mg via INTRAVENOUS

## 2019-12-27 MED ORDER — ONDANSETRON HCL 4 MG/2ML IJ SOLN: 4.0000 mg | Freq: Four times a day (QID) | INTRAMUSCULAR | Status: DC | PRN

## 2019-12-27 MED ORDER — MIDAZOLAM HCL 2 MG/2ML IJ SOLN
INTRAMUSCULAR | Status: AC
  Filled 2019-12-27: qty 2

## 2019-12-27 MED ORDER — PROPOFOL 10 MG/ML IV BOLUS
INTRAVENOUS | Status: DC | PRN
  Administered 2019-12-27: 220 mg via INTRAVENOUS
  Administered 2019-12-27: 30 mg via INTRAVENOUS
  Administered 2019-12-27: 20 mg via INTRAVENOUS

## 2019-12-27 MED ORDER — PHENYLEPHRINE HCL (PRESSORS) 10 MG/ML IV SOLN
INTRAVENOUS | Status: DC | PRN
  Administered 2019-12-27 (×3): 100 ug via INTRAVENOUS

## 2019-12-27 MED ORDER — DEXAMETHASONE SODIUM PHOSPHATE 10 MG/ML IJ SOLN
INTRAMUSCULAR | Status: AC
  Filled 2019-12-27: qty 1

## 2019-12-27 MED ORDER — KETOROLAC TROMETHAMINE 30 MG/ML IJ SOLN
INTRAMUSCULAR | Status: DC | PRN
  Administered 2019-12-27: 30 mg via INTRAVENOUS

## 2019-12-27 MED ORDER — KETAMINE HCL 10 MG/ML IJ SOLN
INTRAMUSCULAR | Status: DC | PRN
  Administered 2019-12-27: 10 mg via INTRAVENOUS
  Administered 2019-12-27 (×2): 20 mg via INTRAVENOUS

## 2019-12-27 MED ORDER — ONDANSETRON HCL 4 MG/2ML IJ SOLN
INTRAMUSCULAR | Status: DC | PRN
  Administered 2019-12-27: 4 mg via INTRAVENOUS

## 2019-12-27 MED ORDER — DEXAMETHASONE SODIUM PHOSPHATE 10 MG/ML IJ SOLN
INTRAMUSCULAR | Status: DC | PRN
  Administered 2019-12-27: 8 mg via INTRAVENOUS

## 2019-12-27 MED ORDER — CHLORHEXIDINE GLUCONATE 0.12 % MT SOLN
OROMUCOSAL | Status: AC
  Administered 2019-12-27: 15 mL via OROMUCOSAL
  Filled 2019-12-27: qty 15

## 2019-12-27 MED ORDER — DEXMEDETOMIDINE HCL IN NACL 400 MCG/100ML IV SOLN: INTRAVENOUS | Status: DC | PRN

## 2019-12-27 MED ORDER — PROPOFOL 500 MG/50ML IV EMUL
INTRAVENOUS | Status: DC | PRN
  Administered 2019-12-27: 150 ug/kg/min via INTRAVENOUS

## 2019-12-27 MED ORDER — FENTANYL CITRATE (PF) 100 MCG/2ML IJ SOLN: 25.0000 ug | INTRAMUSCULAR | Status: DC | PRN

## 2019-12-27 MED ORDER — FENTANYL CITRATE (PF) 100 MCG/2ML IJ SOLN
INTRAMUSCULAR | Status: AC
  Filled 2019-12-27: qty 2

## 2019-12-27 MED ORDER — ROCURONIUM BROMIDE 10 MG/ML (PF) SYRINGE
PREFILLED_SYRINGE | INTRAVENOUS | Status: AC
  Filled 2019-12-27: qty 10

## 2019-12-27 MED ORDER — GABAPENTIN 300 MG PO CAPS
ORAL_CAPSULE | ORAL | Status: AC
  Administered 2019-12-27: 300 mg via ORAL
  Filled 2019-12-27: qty 1

## 2019-12-27 MED ORDER — MIDAZOLAM HCL 2 MG/2ML IJ SOLN
INTRAMUSCULAR | Status: DC | PRN
  Administered 2019-12-27: 2 mg via INTRAVENOUS

## 2019-12-27 SURGICAL SUPPLY — 55 items
APL PRP STRL LF DISP 70% ISPRP (MISCELLANEOUS) ×2
APL SRG 38 LTWT LNG FL B (MISCELLANEOUS)
APL SWBSTK 6 STRL LF DISP (MISCELLANEOUS) ×2
APPLICATOR ARISTA FLEXITIP XL (MISCELLANEOUS) IMPLANT
APPLICATOR COTTON TIP 6 STRL (MISCELLANEOUS) ×2 IMPLANT
APPLICATOR COTTON TIP 6IN STRL (MISCELLANEOUS) ×4 IMPLANT
BAG DRN RND TRDRP ANRFLXCHMBR (UROLOGICAL SUPPLIES) ×2
BAG URINE DRAIN 2000ML AR STRL (UROLOGICAL SUPPLIES) ×4 IMPLANT
BLADE SURG SZ11 CARB STEEL (BLADE) ×4 IMPLANT
CANISTER SUCT 1200ML W/VALVE (MISCELLANEOUS) ×4 IMPLANT
CATH FOLEY 2WAY  5CC 16FR (CATHETERS) ×2
CATH FOLEY 2WAY 5CC 16FR (CATHETERS) ×2
CATH URTH 16FR FL 2W BLN LF (CATHETERS) ×2 IMPLANT
CHLORAPREP W/TINT 26 (MISCELLANEOUS) ×4 IMPLANT
CLOSURE WOUND 1/2 X4 (GAUZE/BANDAGES/DRESSINGS) ×1
COVER WAND RF STERILE (DRAPES) IMPLANT
DRSG TEGADERM 2-3/8X2-3/4 SM (GAUZE/BANDAGES/DRESSINGS) ×12 IMPLANT
GAUZE SPONGE 4X4 16PLY XRAY LF (GAUZE/BANDAGES/DRESSINGS) ×2 IMPLANT
GLOVE SURG SYN 8.0 (GLOVE) ×24 IMPLANT
GLOVE SURG SYN 8.0 PF PI (GLOVE) ×2 IMPLANT
GOWN STRL REUS W/ TWL LRG LVL3 (GOWN DISPOSABLE) ×4 IMPLANT
GOWN STRL REUS W/ TWL XL LVL3 (GOWN DISPOSABLE) ×2 IMPLANT
GOWN STRL REUS W/TWL LRG LVL3 (GOWN DISPOSABLE) ×8
GOWN STRL REUS W/TWL XL LVL3 (GOWN DISPOSABLE) ×8
GRASPER SUT TROCAR 14GX15 (MISCELLANEOUS) ×4 IMPLANT
HEMOSTAT ARISTA ABSORB 3G PWDR (HEMOSTASIS) IMPLANT
IRRIGATION STRYKERFLOW (MISCELLANEOUS) IMPLANT
IRRIGATOR STRYKERFLOW (MISCELLANEOUS) ×4
IV LACTATED RINGERS 1000ML (IV SOLUTION) ×2 IMPLANT
KIT PINK PAD W/HEAD ARE REST (MISCELLANEOUS) ×4
KIT PINK PAD W/HEAD ARM REST (MISCELLANEOUS) ×2 IMPLANT
KIT TURNOVER CYSTO (KITS) ×4 IMPLANT
LABEL OR SOLS (LABEL) ×4 IMPLANT
MORCELLATOR XCISE  COR (MISCELLANEOUS)
MORCELLATOR XCISE COR (MISCELLANEOUS) IMPLANT
NS IRRIG 500ML POUR BTL (IV SOLUTION) ×4 IMPLANT
PACK GYN LAPAROSCOPIC (MISCELLANEOUS) ×4 IMPLANT
PAD OB MATERNITY 4.3X12.25 (PERSONAL CARE ITEMS) ×4 IMPLANT
PAD PREP 24X41 OB/GYN DISP (PERSONAL CARE ITEMS) ×4 IMPLANT
SET CYSTO W/LG BORE CLAMP LF (SET/KITS/TRAYS/PACK) IMPLANT
SET TUBE SMOKE EVAC HIGH FLOW (TUBING) ×4 IMPLANT
SHEARS HARMONIC ACE PLUS 36CM (ENDOMECHANICALS) ×2 IMPLANT
SLEEVE ENDOPATH XCEL 5M (ENDOMECHANICALS) ×4 IMPLANT
SOLUTION ELECTROLUBE (MISCELLANEOUS) ×4 IMPLANT
SPONGE GAUZE 2X2 8PLY STER LF (GAUZE/BANDAGES/DRESSINGS) ×2
SPONGE GAUZE 2X2 8PLY STRL LF (GAUZE/BANDAGES/DRESSINGS) ×6 IMPLANT
STRIP CLOSURE SKIN 1/2X4 (GAUZE/BANDAGES/DRESSINGS) ×3 IMPLANT
SUT VIC AB 0 CT2 27 (SUTURE) ×4 IMPLANT
SUT VIC AB 2-0 UR6 27 (SUTURE) ×4 IMPLANT
SUT VIC AB 4-0 SH 27 (SUTURE) ×8
SUT VIC AB 4-0 SH 27XANBCTRL (SUTURE) ×4 IMPLANT
SYR 10ML LL (SYRINGE) ×4 IMPLANT
SYR 20ML LL LF (SYRINGE) ×4 IMPLANT
TROCAR ENDO BLADELESS 11MM (ENDOMECHANICALS) ×4 IMPLANT
TROCAR XCEL NON-BLD 5MMX100MML (ENDOMECHANICALS) ×4 IMPLANT

## 2019-12-27 NOTE — Transfer of Care (Signed)
Immediate Anesthesia Transfer of Care Note  Patient: Sonya [name]  Procedure(s) Performed: LAPAROSCOPIC SUPRACERVICAL HYSTERECTOMY (N/A ) LAPAROSCOPIC BILATERAL SALPINGECTOMY (Bilateral )  Patient Location: PACU  Anesthesia Type:General  Level of Consciousness: drowsy  Airway & Oxygen Therapy: Patient Spontanous Breathing and Patient connected to face mask oxygen  Post-op Assessment: Report given to RN and Post -op Vital signs reviewed and stable  Post vital signs: Reviewed and stable  Last Vitals:  Vitals Value Taken Time  BP 119/69 12/27/19 1251  Temp    Pulse 81 12/27/19 1258  Resp 21 12/27/19 1258  SpO2 100 % 12/27/19 1258  Vitals shown include unvalidated device data.  Last Pain:  Vitals:   12/27/19 1251  TempSrc:   PainSc: (P) Asleep         Complications: No complications documented.

## 2019-12-27 NOTE — Op Note (Signed)
NAME: Sonya [name], Sonya [name]. MEDICAL RECORD [id number] ACCOUNT [id number] DATE OF BIRTH:09-May-1990 FACILITY: [facility] LOCATION: [facility] PHYSICIAN:Enola Siebers Cloyde Reams, MD  OPERATIVE REPORT  DATE OF PROCEDURE:  12/27/2019  PREOPERATIVE DIAGNOSIS:  Menorrhagia, failing conservative treatment.  POSTOPERATIVE DIAGNOSES: 1.  Menorrhagia, failing conservative treatment. 2.  Pelvic adhesive disease.  PROCEDURES: 1.  Laparoscopic supracervical hysterectomy. 2.  Bilateral salpingectomy. 3.  Left ovarian cystotomy.  SURGEON:  Jennell Corner, MD.  FIRST ASSISTANT:  Ranae Plumber, MD  ANESTHESIA:  General endotracheal anesthesia.  INDICATIONS:  A 29 year old gravida 3, para 2 patient with a long history of menorrhagia, failing conservative treatment, failing Depo-Provera intrauterine device.  The patient elects for definitive therapy.  DESCRIPTION OF PROCEDURE:  After adequate general endotracheal anesthesia, the patient was placed in dorsal supine position.  Legs were placed in the [facility] stirrups.  The patient's abdomen, perineum and vagina were prepped and draped in normal sterile  fashion.  The patient did receive 2 g IV Ancef prior to commencement of the case for surgical prophylaxis.  Timeout was performed.  Speculum was placed into the vagina and the anterior cervix was grasped with a single-tooth tenaculum and a uterine sound  was placed into the fundus of the uterus and the 2 instruments were tethered together with Steri-Strips.  Gloves and gowns were changed and attention was directed to the patient's abdomen where a 5 mm infraumbilical incision was made after injecting with  0.5% Marcaine.  The 5 mm laparoscope was advanced into the abdominal cavity with the Optiview cannula.  The patient's abdomen was insufflated.  Second port placement was placed in the left lower quadrant 3 cm medial to the left anterior iliac spine.   The #11 trocar was advanced under direct visualization.   A third port site was placed on the right lower quadrant, again 3 cm medial to the right anterior iliac spine and a 5 mm trocar was advanced under direct visualization.  Initial impression revealed  uterine adhesions to the anterior abdominal wall, several filmy adhesions of the adnexa and adhesions of the anterior cul-de-sac to the lower uterine segment.  Harmonic scalpel was brought up and the left fallopian tube was grasped at the fimbriated end  and the mesosalpinx was dissected to the level of the cornua.  The round ligament was then opened with Harmonic scalpel and the uteroovarian ligament was transected and the broad ligament was dissected and the left uterine artery was skeletonized,  cauterized and transected.  Bladder flap was taken down with Harmonic scalpel.  The dense adhesion of the uterus to the anterior abdominal wall was taken down with Harmonic scalpel as well.  Similar procedure was repeated on the right side.  The  fimbriated end of the right fallopian tube was grasped and the mesosalpinx was dissected to the level of the cornua.  Uteroovarian ligament was transected.  Broad ligament was opened and transected.  Uterine artery was identified, cauterized and  transected.  At the level of the uterosacral ligaments, the cervix was then transected with Harmonic scalpel.  The uterine sound was then removed and the endocervical canal was cauterized with Kleppinger cautery.  Gloves and gown were again changed.  The  morcellator was brought up to the operative field and through the left lower port site, the morcellator was brought into the abdominal cavity under direct visualization.  The uterus and fallopian tubes were then morcellated in standard fashion.  The  morcellator was removed and the 11 mm trocar was readvanced.  The patient's  abdomen was copiously irrigated.  Of note, the ureters had normal peristaltic activity prior to dissection and at the end of the case there was likewise normal  peristaltic  activity bilaterally of each ureter.  Good hemostasis was noted.  Intraabdominal pressure was lowered to 7 mmHg and good hemostasis was noted.  Pictures were taken.  The upper abdomen appeared normal.  The left lower port site was closed with a fascial  layer of 2-0 Vicryl suture.  The patient's abdomen was deflated and all skin incisions were closed with interrupted 3-0 Vicryl suture.  Dermabond was applied.  The single-tooth tenaculum was removed from the anterior cervix and silver nitrate was used at  the tenaculum sites.  Good hemostasis was noted.  COMPLICATIONS:  There were no complications.  ESTIMATED BLOOD LOSS:  100 mL.  URINE OUTPUT:  Foley during the procedure 100 mL.  INTRAOPERATIVE FLUIDS:  1000 mL.  The patient did receive 30 mg of intravenous Toradol at the end of the case and the patient was taken to recovery room in good condition.  VN/NUANCE  D:12/27/2019 T:12/27/2019 JOB:[id number]

## 2019-12-27 NOTE — Anesthesia Procedure Notes (Signed)
Procedure Name: Intubation Date/Time: 12/27/2019 10:35 AM Performed by: Lynden Oxford, CRNA Pre-anesthesia Checklist: Patient identified, Emergency Drugs available, Suction available and Patient being monitored Patient Re-evaluated:Patient Re-evaluated prior to induction Oxygen Delivery Method: Circle system utilized Preoxygenation: Pre-oxygenation with 100% oxygen Induction Type: IV induction Ventilation: Mask ventilation without difficulty Laryngoscope Size: McGraph and 3 Grade View: Grade I Tube type: Oral Tube size: 7.0 mm Number of attempts: 1 Airway Equipment and Method: Stylet,  Oral airway and Video-laryngoscopy Placement Confirmation: ETT inserted through vocal cords under direct vision,  positive ETCO2 and breath sounds checked- equal and bilateral Secured at: 19 cm Tube secured with: Tape Dental Injury: Teeth and Oropharynx as per pre-operative assessment

## 2019-12-27 NOTE — Progress Notes (Signed)
Pt is ready for surgery LSH and bilateral salpingectomy  Labs reviewed . I will repeat cbc and metb before sx. All questions answered . Proceed

## 2019-12-27 NOTE — Brief Op Note (Signed)
12/27/2019  12:31 PM  PATIENT:  Sonya [name]  29 y.o. female  PRE-OPERATIVE DIAGNOSIS:  menorrhagia failing conservative treatment  POST-OPERATIVE DIAGNOSIS:  menorrhagia failing conservative treatment  PROCEDURE:  Procedure(s): LAPAROSCOPIC SUPRACERVICAL HYSTERECTOMY (N/A) LAPAROSCOPIC BILATERAL SALPINGECTOMY (Bilateral)  SURGEON:  Surgeon(s) and Role:    * Ashdon Gillson, Ihor Austin, MD - Primary    * Ward, Elenora Fender, MD - Assisting  PHYSICIAN ASSISTANT:   ASSISTANTS: none   ANESTHESIA:   general  EBL:  100 mL , IOF : 1000 cc uo 100 cc  BLOOD ADMINISTERED:none  DRAINS: none   LOCAL MEDICATIONS USED:  MARCAINE     SPECIMEN:  Source of Specimen:  uterus and bilateral salpingectomy   DISPOSITION OF SPECIMEN:  PATHOLOGY  COUNTS:  YES  TOURNIQUET:  * No tourniquets in log *  DICTATION: .Other Dictation: Dictation Number [id number]  PLAN OF CARE: Discharge to home after PACU  PATIENT DISPOSITION:  PACU - hemodynamically stable.   Delay start of Pharmacological VTE agent (>24hrs) due to surgical blood loss or risk of bleeding: not applicable

## 2019-12-27 NOTE — Discharge Instructions (Signed)
Supracervical Hysterectomy, Care After This sheet gives you information about how to care for yourself after your procedure. Your health care provider may also give you more specific instructions. If you have problems or questions, contact your health care provider. What can I expect after the procedure? After the procedure, it is common to have some discomfort, tenderness, swelling, and bruising at the surgical area. This normally lasts for about 2 weeks. Follow these instructions at home: Medicines  Take over-the-counter and prescription medicines only as told by your health care provider.  Do not take aspirin. It can cause bleeding. Ask your health care provider when it is safe to use aspirin again.  Do not drive or use heavy machinery while taking prescription pain medicine.  To prevent or treat constipation while you are taking prescription pain medicine, your health care provider may recommend that you: ? Drink enough fluid to keep your urine pale yellow. ? Take over-the-counter or prescription medicines. ? Eat foods that are high in fiber, such as fresh fruits and vegetables, whole grains, and beans. ? Limit foods that are high in fat and processed sugars, such as fried and sweet foods. Activity  Get plenty of rest and sleep.  Try to have someone home with you for 1-2 weeks to help you with everyday chores.  Return to your normal activities as told by your health care provider. Ask your health care provider what activities are safe for you.  Do not lift anything that is heavier than 10 lb (4.5 kg) or the limit that your health care provider tells you until he or she says that it is safe.  Do not douche, use tampons, or have sex for at least 6 weeks or until your health care provider says it is safe to do so. Incision care   Follow instructions from your health care provider about how to take care of your incisions. Make sure you: ? Wash your hands with soap and water before  you change your bandage (dressing). If soap and water are not available, use hand sanitizer. ? Change your dressing as told by your health care provider. ? Leave stitches (sutures), skin glue, or adhesive strips in place. These skin closures may need to stay in place for 2 weeks or longer. If adhesive strip edges start to loosen and curl up, you may trim the loose edges. Do not remove adhesive strips completely unless your health care provider tells you to do that.  Check your incision area every day for signs of infection. Check for: ? Redness, swelling, or pain. ? Fluid or blood. ? Warmth. ? Pus or a bad smell.  Take showers instead of baths for 2-3 weeks or as told by your health care provider. Eating and drinking  Drink enough fluids to keep your urine clear or pale yellow.  Do not drink alcohol until your health care provider says it is okay to do so. General instructions  Monitor your temperature for as long as told by your health care provider.  Keep all follow-up visits as told by your health care provider. This is important. Contact a health care provider if:  You have redness, swelling, or pain around an incision.  You have chills or fever.  You have fluid or blood coming from an incision.  An incision feels warm to the touch.  You have pus or a bad smell coming from an incision.  Your incisions break open.  You feel dizzy or lightheaded.  You have pain or   bleeding when you urinate.  You have diarrhea that does not go away.  You have nausea and vomiting that do not go away.  You have abnormal vaginal discharge.  You have a rash.  You have pain that does not go away when you take medicine. Get help right away if:  You have a fever and your symptoms suddenly get worse.  You have severe abdominal pain.  You have chest pain.  You have shortness of breath.  You faint.  You have pain, swelling, or redness in your leg.  You have heavy vaginal bleeding  with blood clots. Summary  After the procedure, it is common to have some discomfort, tenderness, swelling, and bruising at the surgical site. This normally lasts for about 2 weeks.  Get help right away if you have excessive vaginal bleeding or severe abdominal pain. This information is not intended to replace advice given to you by your health care provider. Make sure you discuss any questions you have with your health care provider. Document Revised: 02/03/2017 Document Reviewed: 05/19/2016 Elsevier Patient Education  2020 [employer]   AMBULATORY SURGERY  DISCHARGE INSTRUCTIONS   1) The drugs that you were given will stay in your system until tomorrow so for the next 24 hours you should not:  A) Drive an automobile B) Make any legal decisions C) Drink any alcoholic beverage   2) You may resume regular meals tomorrow.  Today it is better to start with liquids and gradually work up to solid foods.  You may eat anything you prefer, but it is better to start with liquids, then soup and crackers, and gradually work up to solid foods.   3) Please notify your doctor immediately if you have any unusual bleeding, trouble breathing, redness and pain at the surgery site, drainage, fever, or pain not relieved by medication.    4) Additional Instructions:        Please contact your physician with any problems or Same Day Surgery at [phone], Monday through Friday 6 am to 4 pm, or [facility] Main number at [phone]. 

## 2019-12-27 NOTE — Anesthesia Preprocedure Evaluation (Addendum)
Anesthesia Evaluation  Patient identified by MRN, date of birth, ID band Patient awake    Reviewed: Allergy & Precautions, NPO status , Patient's Chart, lab work & pertinent test results  History of Anesthesia Complications (+) PROLONGED EMERGENCE and history of anesthetic complications (Prolonged emergence with wisdom teeth extraction)  Airway Mallampati: III       Dental   Pulmonary neg sleep apnea, neg COPD, Current Smoker and Patient abstained from smoking.,           Cardiovascular (-) hypertension (Pre-eclampsi, no problems since)(-) Past MI and (-) CHF (-) dysrhythmias + Valvular Problems/Murmurs (tricuspid regurge) AI      Neuro/Psych neg Seizures Anxiety Depression Bipolar Disorder    GI/Hepatic Neg liver ROS, neg GERD  ,  Endo/Other  neg diabetes  Renal/GU negative Renal ROS     Musculoskeletal   Abdominal   Peds  Hematology  (+) anemia ,   Anesthesia Other Findings   Reproductive/Obstetrics                            Anesthesia Physical Anesthesia Plan  ASA: III  Anesthesia Plan: General   Post-op Pain Management:    Induction: Intravenous  PONV Risk Score and Plan: 2 and Ondansetron and Dexamethasone  Airway Management Planned: Oral ETT  Additional Equipment:   Intra-op Plan:   Post-operative Plan:   Informed Consent: I have reviewed the patients History and Physical, chart, labs and discussed the procedure including the risks, benefits and alternatives for the proposed anesthesia with the patient or authorized representative who has indicated his/her understanding and acceptance.       Plan Discussed with:   Anesthesia Plan Comments:         Anesthesia Quick Evaluation

## 2019-12-27 NOTE — Anesthesia Postprocedure Evaluation (Signed)
Anesthesia Post Note  Patient: Sonya [name]  Procedure(s) Performed: LAPAROSCOPIC SUPRACERVICAL HYSTERECTOMY (N/A ) LAPAROSCOPIC BILATERAL SALPINGECTOMY (Bilateral )  Patient location during evaluation: PACU Anesthesia Type: General Level of consciousness: awake and alert Pain management: pain level controlled Vital Signs Assessment: post-procedure vital signs reviewed and stable Respiratory status: spontaneous breathing and respiratory function stable Cardiovascular status: stable Anesthetic complications: no   No complications documented.   Last Vitals:  Vitals:   12/27/19 1251 12/27/19 1306  BP: 119/69 110/65  Pulse: 81 78  Resp: 20 19  Temp: 36.6 C   SpO2: 100% 100%    Last Pain:  Vitals:   12/27/19 1306  TempSrc:   PainSc: Asleep                 Tim Wilhide K

## 2019-12-28 ENCOUNTER — Encounter: Payer: Self-pay | Admitting: Obstetrics and Gynecology

## 2020-01-01 LAB — SURGICAL PATHOLOGY

## 2020-03-17 ENCOUNTER — Other Ambulatory Visit: Payer: Self-pay | Admitting: Primary Care

## 2020-05-11 ENCOUNTER — Ambulatory Visit (INDEPENDENT_AMBULATORY_CARE_PROVIDER_SITE_OTHER): Payer: BLUE CROSS/BLUE SHIELD | Admitting: Urology

## 2020-05-11 ENCOUNTER — Encounter: Payer: Self-pay | Admitting: Urology

## 2020-05-11 ENCOUNTER — Other Ambulatory Visit: Payer: Self-pay

## 2020-05-11 VITALS — BP 122/75 | HR 97 | Ht 63.0 in | Wt 212.0 lb

## 2020-05-11 DIAGNOSIS — R102 Pelvic and perineal pain: Secondary | ICD-10-CM | POA: Diagnosis not present

## 2020-05-11 DIAGNOSIS — R3129 Other microscopic hematuria: Secondary | ICD-10-CM | POA: Diagnosis not present

## 2020-05-11 NOTE — Progress Notes (Signed)
05/11/2020 12:10 PM   Sonya [name] 1990/10/11 [id number]  Referring provider: Gildardo Pounds, PA [address] [place],  [place] [address]  Chief Complaint  Patient presents with  . Hematuria    HPI: Sonya [name] is a 30 y.o. female referred for microscopic hematuria, low back pain and sensation of incomplete emptying.   Saw PCP 05/04/2020 complaining of uncomfortable sensation when voiding and persistent urge to void after emptying; also low back pain  UA at that visit was negative; denies gross hematuria  Does complain of recurrent UTI though all urine culture results at [facility] from 2016-2021 are either negative or mixed flora  Urine culture with PCP 2/28 with insignificant mixed flora  Laparoscopic supracervical hysterectomy with bilateral salpingectomy and left ovarian cystectomy by Dr. Hewitt Blade on 12/27/2019 for menorrhagia failing conservative treatment and pelvic adhesive disease  Review of [facility] UA's showed 4-10 RBCs September 2020 and May 2021   PMH: Past Medical History:  Diagnosis Date  . Anemia   . Anxiety   . Bipolar 1 disorder (HCC)   . Complication of anesthesia    trouble waking up   . Depression   . Dyspnea   . Hypertension    GTPN  . Meniere disease UNKNOWN  . Vertigo     Surgical History: Past Surgical History:  Procedure Laterality Date  . CESAREAN SECTION WITH BILATERAL TUBAL LIGATION N/A 08/12/2019   Procedure: CESAREAN SECTION WITH BILATERAL TUBAL LIGATION;  Surgeon: Schermerhorn, Ihor Austin, MD;  Location: [facility] ORS;  Service: Obstetrics;  Laterality: N/A;  . COLONOSCOPY WITH PROPOFOL N/A 10/25/2017   Procedure: COLONOSCOPY WITH PROPOFOL;  Surgeon: Toledo, Boykin Nearing, MD;  Location: [facility]COPY;  Service: Gastroenterology;  Laterality: N/A;  . ESOPHAGOGASTRODUODENOSCOPY (EGD) WITH PROPOFOL N/A 10/25/2017   Procedure: ESOPHAGOGASTRODUODENOSCOPY (EGD) WITH PROPOFOL;  Surgeon: Toledo, Boykin Nearing, MD;  Location: [facility];   Service: Gastroenterology;  Laterality: N/A;  . LAPAROSCOPIC BILATERAL SALPINGECTOMY Bilateral 12/27/2019   Procedure: LAPAROSCOPIC BILATERAL SALPINGECTOMY;  Surgeon: Schermerhorn, Ihor Austin, MD;  Location: [facility] ORS;  Service: Gynecology;  Laterality: Bilateral;  . LAPAROSCOPIC SUPRACERVICAL HYSTERECTOMY N/A 12/27/2019   Procedure: LAPAROSCOPIC SUPRACERVICAL HYSTERECTOMY;  Surgeon: Schermerhorn, Ihor Austin, MD;  Location: [facility] ORS;  Service: Gynecology;  Laterality: N/A;  . TUBAL LIGATION    . WISDOM TOOTH EXTRACTION    . WRIST SURGERY      Home Medications:  Allergies as of 05/11/2020      Reactions   Adipex-p [phentermine] Shortness Of Breath, Swelling, Palpitations, Hypertension   Tunnel vision, chest pain, headache, and feet became swollen   Codeine Nausea And Vomiting   Adhesive [tape] Rash   Patient prefers paper tape      Medication List       Accurate as of May 11, 2020 12:10 PM. If you have any questions, ask your nurse or doctor.        ibuprofen 800 MG tablet Commonly known as: ADVIL Take 1 tablet (800 mg total) by mouth every 6 (six) hours. What changed:   when to take this  reasons to take this   lamoTRIgine 100 MG tablet Commonly known as: LAMICTAL Take 250 mg by mouth at bedtime.   ondansetron 8 MG tablet Commonly known as: ZOFRAN Take 8 mg by mouth 3 (three) times daily as needed for nausea/vomiting.   senna-docusate 8.6-50 MG tablet Commonly known as: Senokot-S Take 2 tablets by mouth daily.   sertraline 100 MG tablet Commonly known as: ZOLOFT Take 200  mg by mouth at bedtime.   simethicone 80 MG chewable tablet Commonly known as: MYLICON Chew 1 tablet (80 mg total) by mouth 3 (three) times daily after meals.   traZODone 100 MG tablet Commonly known as: DESYREL Take 100 mg by mouth at bedtime.   triamcinolone 0.1 % Commonly known as: KENALOG Apply 1 application topically daily as needed (eczema).       Allergies:  Allergies  Allergen  Reactions  . Adipex-P [Phentermine] Shortness Of Breath, Swelling, Palpitations and Hypertension    Tunnel vision, chest pain, headache, and feet became swollen  . Codeine Nausea And Vomiting  . Adhesive [Tape] Rash    Patient prefers paper tape    Family History: Family History  Problem Relation Age of Onset  . Graves' disease Mother   . Physical abuse Mother   . Drug abuse Father   . Alcohol abuse Father   . Seizures Father   . Drug abuse Sister   . Anxiety disorder Sister   . Depression Brother   . Anxiety disorder Brother   . Bipolar disorder Brother     Social History:  reports that she has been smoking cigarettes. She has been smoking about 0.20 packs per day. She has never used smokeless tobacco. She reports previous alcohol use. She reports that she does not use drugs.   Physical Exam: BP 122/75   Pulse 97   Ht 5\' 3"  (1.6 m)   Wt 212 lb (96.2 kg)   BMI 37.55 kg/m   Constitutional:  Alert and oriented, No acute distress. HEENT:  AT, moist mucus membranes.  Trachea midline, no masses. Cardiovascular: No clubbing, cyanosis, or edema. Respiratory: Normal respiratory effort, no increased work of breathing. Skin: No rashes, bruises or suspicious lesions. Neurologic: Grossly intact, no focal deficits, moving all 4 extremities. Psychiatric: Normal mood and affect.  Laboratory Data:  Urinalysis Pending  Assessment & Plan:    1.  Microscopic hematuria  AUA risk stratification: Low  We discussed the recommended evaluation for low risk hematuria to include renal ultrasound and cystoscopy  Ultrasound order entered and cystoscopy scheduled  The procedures were discussed  2.  Pelvic pain  As above   , MD  [facility] [address] [place], [place] [phone]

## 2020-05-13 LAB — MICROSCOPIC EXAMINATION

## 2020-05-13 LAB — URINALYSIS, COMPLETE
Bilirubin, UA: NEGATIVE
Glucose, UA: NEGATIVE
Ketones, UA: NEGATIVE
Leukocytes,UA: NEGATIVE
Nitrite, UA: NEGATIVE
Protein,UA: NEGATIVE
Specific Gravity, UA: 1.025 (ref 1.005–1.030)
Urobilinogen, Ur: 0.2 mg/dL (ref 0.2–1.0)
pH, UA: 5.5 (ref 5.0–7.5)

## 2020-06-09 ENCOUNTER — Other Ambulatory Visit: Payer: Self-pay

## 2020-06-09 ENCOUNTER — Ambulatory Visit
Admission: RE | Admit: 2020-06-09 | Discharge: 2020-06-09 | Disposition: A | Discharge status: Home / Self Care | Payer: BLUE CROSS/BLUE SHIELD | Source: Ambulatory Visit | Attending: Urology | Admitting: Urology

## 2020-06-09 DIAGNOSIS — R3129 Other microscopic hematuria: Secondary | ICD-10-CM | POA: Diagnosis not present

## 2020-06-09 DIAGNOSIS — R102 Pelvic and perineal pain: Secondary | ICD-10-CM | POA: Diagnosis present

## 2020-06-12 ENCOUNTER — Ambulatory Visit (INDEPENDENT_AMBULATORY_CARE_PROVIDER_SITE_OTHER): Payer: BLUE CROSS/BLUE SHIELD | Admitting: Urology

## 2020-06-12 ENCOUNTER — Encounter: Payer: Self-pay | Admitting: Urology

## 2020-06-12 ENCOUNTER — Other Ambulatory Visit: Payer: Self-pay | Admitting: Urology

## 2020-06-12 ENCOUNTER — Other Ambulatory Visit: Payer: Self-pay

## 2020-06-12 VITALS — BP 113/63 | HR 73 | Ht 63.0 in | Wt 212.0 lb

## 2020-06-12 DIAGNOSIS — O3442 Maternal care for other abnormalities of cervix, second trimester: Secondary | ICD-10-CM | POA: Insufficient documentation

## 2020-06-12 DIAGNOSIS — O26849 Uterine size-date discrepancy, unspecified trimester: Secondary | ICD-10-CM | POA: Insufficient documentation

## 2020-06-12 DIAGNOSIS — R3129 Other microscopic hematuria: Secondary | ICD-10-CM

## 2020-06-12 DIAGNOSIS — R300 Dysuria: Secondary | ICD-10-CM

## 2020-06-12 DIAGNOSIS — O418X10 Other specified disorders of amniotic fluid and membranes, first trimester, not applicable or unspecified: Secondary | ICD-10-CM | POA: Insufficient documentation

## 2020-06-12 DIAGNOSIS — O26843 Uterine size-date discrepancy, third trimester: Secondary | ICD-10-CM | POA: Insufficient documentation

## 2020-06-12 DIAGNOSIS — R102 Pelvic and perineal pain: Secondary | ICD-10-CM

## 2020-06-12 DIAGNOSIS — O0992 Supervision of high risk pregnancy, unspecified, second trimester: Secondary | ICD-10-CM | POA: Insufficient documentation

## 2020-06-12 DIAGNOSIS — N920 Excessive and frequent menstruation with regular cycle: Secondary | ICD-10-CM | POA: Insufficient documentation

## 2020-06-12 LAB — URINALYSIS, COMPLETE
Bilirubin, UA: NEGATIVE
Glucose, UA: NEGATIVE
Ketones, UA: NEGATIVE
Leukocytes,UA: NEGATIVE
Nitrite, UA: NEGATIVE
Protein,UA: NEGATIVE
Specific Gravity, UA: 1.025 (ref 1.005–1.030)
Urobilinogen, Ur: 0.2 mg/dL (ref 0.2–1.0)
pH, UA: 5.5 (ref 5.0–7.5)

## 2020-06-12 LAB — MICROSCOPIC EXAMINATION: Bacteria, UA: NONE SEEN

## 2020-06-12 MED ORDER — URIBEL 118 MG PO CAPS
118.0000 mg | ORAL_CAPSULE | Freq: Four times a day (QID) | ORAL | 0 refills | Status: AC | PRN
Start: 2020-06-12 — End: ?

## 2020-06-12 MED ORDER — LIDOCAINE HCL URETHRAL/MUCOSAL 2 % EX GEL
1.0000 "application " | Freq: Once | CUTANEOUS | Status: AC
  Administered 2020-06-12: 1 via URETHRAL

## 2020-06-12 NOTE — Progress Notes (Signed)
   06/12/20  CC:  Chief Complaint  Patient presents with  . Cysto    HPI: Refer to prior office note 05/11/2020.  No complaints today  Blood pressure 113/63, pulse 73, height 5\' 3"  (1.6 m), weight 212 lb (96.2 kg),  NED. A&Ox3.   No respiratory distress   Abd soft, NT, ND Normal external genitalia with patent urethral meatus  Imaging: Renal ultrasound showed no masses, hydronephrosis or urinary calculi.  Radiology commented on parenchymal echogenicity that may indicate hepatic dysfunction/steatosis  Cystoscopy Procedure Note  Patient identification was confirmed, informed consent was obtained, and patient was prepped using Betadine solution.  Lidocaine jelly was administered per urethral meatus.    Procedure: - Flexible cystoscope introduced, without any difficulty.   - Thorough search of the bladder revealed:    normal urethral meatus    normal urothelium    no stones    no ulcers     no tumors    no urethral polyps    no trabeculation  - Ureteral orifices were normal in position and appearance.  Post-Procedure: - Patient tolerated the procedure well  Assessment/ Plan:  Low risk hematuria with negative renal ultrasound/cystoscopy  UA today negative for RBCs  Has recurrent symptoms of dysuria and multiple negative urine cultures  Trial Uribel as needed for those symptoms and UA/culture for persistent symptoms  Will message gastroenterology regarding her hepatic findings ultrasound to see if this needs further evaluation   , MD

## 2020-06-15 ENCOUNTER — Encounter: Payer: Self-pay | Admitting: Urology

## 2020-07-26 ENCOUNTER — Other Ambulatory Visit: Payer: Self-pay | Admitting: Urology

## 2020-07-26 DIAGNOSIS — K760 Fatty (change of) liver, not elsewhere classified: Secondary | ICD-10-CM

## 2020-07-27 ENCOUNTER — Encounter: Payer: Self-pay | Admitting: *Deleted

## 2021-12-21 ENCOUNTER — Emergency Department: Payer: BLUE CROSS/BLUE SHIELD

## 2021-12-21 ENCOUNTER — Other Ambulatory Visit: Payer: Self-pay

## 2021-12-21 ENCOUNTER — Emergency Department
Admission: EM | Admit: 2021-12-21 | Discharge: 2021-12-21 | Disposition: A | Discharge status: Home / Self Care | Payer: BLUE CROSS/BLUE SHIELD | Attending: Student in an Organized Health Care Education/Training Program | Admitting: Student in an Organized Health Care Education/Training Program

## 2021-12-21 DIAGNOSIS — X501XXA Overexertion from prolonged static or awkward postures, initial encounter: Secondary | ICD-10-CM | POA: Insufficient documentation

## 2021-12-21 DIAGNOSIS — S92355A Nondisplaced fracture of fifth metatarsal bone, left foot, initial encounter for closed fracture: Secondary | ICD-10-CM | POA: Insufficient documentation

## 2021-12-21 DIAGNOSIS — S99922A Unspecified injury of left foot, initial encounter: Secondary | ICD-10-CM | POA: Diagnosis present

## 2021-12-21 MED ORDER — OXYCODONE-ACETAMINOPHEN 5-325 MG PO TABS: 1.0000 | ORAL_TABLET | ORAL | 0 refills | Status: DC | PRN

## 2021-12-21 NOTE — ED Triage Notes (Signed)
Pt comes with c/o left ankle pain following fall this am.

## 2021-12-21 NOTE — ED Provider Triage Note (Signed)
Emergency Medicine Provider Triage Evaluation Note  Sonya [name] , a 31 y.o. female  was evaluated in triage.  Pt complains of left ankle pain after a trip and fall this morning.  She reports that she excellently stepped off a curb.  She reports that she has not attempted to weight-bear since.  She denies numbness or tingling.  There is no other injury sustained.  No history of LOC.  No paresthesias.  Review of Systems  Positive: Foot/ankle pain Negative: Numbness, tingling or open wound  Physical Exam  BP 127/89 (BP Location: Right Arm)   Pulse 84   Temp 98.7 F (37.1 C) (Oral)   Resp 17   SpO2 99%  Gen:   Awake, no distress   Resp:  Normal effort  MSK:   Moves extremities without difficulty  Other:  Left ankle : No open wounds.  Tenderness over the anterior talofibular ligament and posterior to lateral malleolus, no lateral or medial malleolar tenderness. Proximal fifth metacarpal tenderness. No proximal fibular tenderness. 2+ pedal pulses with brisk capillary refill. Intact distal sensation and strength with normal ROM. Able to plantar flex and dorsiflex against resistance. Able to invert and evert against resistance. Negative  dorsiflexion external rotation test. Negative squeeze test. Negative Thompson test  Medical Decision Making  Medically screening exam initiated at 11:09 AM.  Appropriate orders placed.  Sonya [name] was informed that the remainder of the evaluation will be completed by another provider, this initial triage assessment does not replace that evaluation, and the importance of remaining in the ED until their evaluation is complete.     Marquette Old, PA-C 12/21/21 1133

## 2021-12-21 NOTE — ED Provider Notes (Signed)
[facility] Provider Note    Event Date/Time   First MD Initiated Contact with Patient 12/21/21 1150     (approximate)   History   Ankle Pain   HPI  Sonya [name] is a 31 y.o. female presents to the ER for evaluation of left ankle pain that occurred after she rolled her ankle on the curb.  Denies any other associated injury.  Rates the pain is mild to moderate lateral foot     Physical Exam   Triage Vital Signs: ED Triage Vitals  Enc Vitals Group     BP 12/21/21 1106 127/89     Pulse Rate 12/21/21 1106 84     Resp 12/21/21 1106 17     Temp 12/21/21 1106 98.7 F (37.1 C)     Temp Source 12/21/21 1106 Oral     SpO2 12/21/21 1106 99 %     Weight 12/21/21 1151 212 lb 1.3 oz (96.2 kg)     Height 12/21/21 1151 5\' 3"  (1.6 m)     Head Circumference --      Peak Flow --      Pain Score 12/21/21 1041 6     Pain Loc --      Pain Edu? --      Excl. in GC? --     Most recent vital signs: Vitals:   12/21/21 1106  BP: 127/89  Pulse: 84  Resp: 17  Temp: 98.7 F (37.1 C)  SpO2: 99%     Constitutional: Alert  Eyes: Conjunctivae are normal.  Head: Atraumatic. Nose: No congestion/rhinnorhea. Mouth/Throat: Mucous membranes are moist.   Neck: Painless ROM.  Cardiovascular:   Good peripheral circulation. Respiratory: Normal respiratory effort.  No retractions.  Gastrointestinal: Soft and nontender.  Musculoskeletal:  no deformity.  Tenderness palpation the base of the fifth left metatarsal.  No ankle instability neurovascular intact distally.  No laceration or abrasion. Neurologic:  MAE spontaneously. No gross focal neurologic deficits are appreciated.  Skin:  Skin is warm, dry and intact. No rash noted. Psychiatric: Mood and affect are normal. Speech and behavior are normal.    ED Results / Procedures / Treatments   Labs (all labs ordered are listed, but only abnormal results are displayed) Labs Reviewed - No data to  display   EKG     RADIOLOGY Please see ED Course for my review and interpretation.  I personally reviewed all radiographic images ordered to evaluate for the above acute complaints and reviewed radiology reports and findings.  These findings were personally discussed with the patient.  Please see medical record for radiology report.    PROCEDURES:  Critical Care performed:   Procedures   MEDICATIONS ORDERED IN ED: Medications - No data to display   IMPRESSION / MDM / ASSESSMENT AND PLAN / ED COURSE  I reviewed the triage vital signs and the nursing notes.                              Differential diagnosis includes, but is not limited to, fracture, contusion, dislocation, sprain  Patient presented to the ER for evaluation of left foot pain as described above.  X-ray of the foot on my review and interpretation does show evidence of base of fifth metatarsal fracture no displacement.  Will be placed in boot as well as crutches made nonweightbearing for close outpatient follow-up.  Patient agreeable to plan.    FINAL CLINICAL  IMPRESSION(S) / ED DIAGNOSES   Final diagnoses:  Closed nondisplaced fracture of fifth metatarsal bone of left foot, initial encounter     Rx / DC Orders   ED Discharge Orders          Ordered    oxyCODONE-acetaminophen (PERCOCET) 5-325 MG tablet  Every 4 hours PRN        12/21/21 1215             Note:  This document was prepared using Dragon voice recognition software and may include unintentional dictation errors.    Merlyn Lot, MD 12/21/21 1215

## 2021-12-22 ENCOUNTER — Ambulatory Visit (INDEPENDENT_AMBULATORY_CARE_PROVIDER_SITE_OTHER): Payer: BLUE CROSS/BLUE SHIELD | Admitting: Podiatry

## 2021-12-22 DIAGNOSIS — E559 Vitamin D deficiency, unspecified: Secondary | ICD-10-CM | POA: Diagnosis not present

## 2021-12-22 DIAGNOSIS — S92352A Displaced fracture of fifth metatarsal bone, left foot, initial encounter for closed fracture: Secondary | ICD-10-CM

## 2021-12-22 MED ORDER — OXYCODONE-ACETAMINOPHEN 5-325 MG PO TABS: 1.0000 | ORAL_TABLET | Freq: Four times a day (QID) | ORAL | 0 refills | Status: AC | PRN

## 2021-12-22 NOTE — Progress Notes (Signed)
  Subjective:  Patient ID: Sonya [name], female    DOB: [birth year],  MRN: [id number]  Chief Complaint  Patient presents with   Foot Injury    NP  left foot injury/fracture  -  She fell after stumbling over a curb at the gas station - Xrays done yesterday- came in wearing a boot - had recent wrist surgery so she is not able to use crutches    31 y.o. female presents with the above complaint. History confirmed with patient.   Objective:  Physical Exam: warm, good capillary refill, no trophic changes or ulcerative lesions, normal DP and PT pulses, normal sensory exam, and pain and tenderness over the fifth metatarsal base   Radiographs: Multiple views x-ray of the left foot: Radiographs taken on 12/21/2021 from [facility] ER show nondisplaced zone 1 5th metatarsal base fracture Assessment:   1. Closed fracture of base of fifth metatarsal bone of left foot, initial encounter   2. Vitamin D deficiency      Plan:  Patient was evaluated and treated and all questions answered.  We reviewed her radiographs.  There is a nondisplaced zone 1 fracture of the fifth metatarsal.  I recommended nonoperative treatment.  I recommend she be nonweightbearing in the cam walker boot for 4 weeks.  This is been difficult for her due to the recent carpal tunnel surgery that she had.  I recommend she obtain a rolling knee scooter to offload the foot which would be easier for her than crutches.  She will obtain this from medical supplier or [employer].  I will see her back in 4 weeks for new radiographs.  She says she does have a history of vitamin D deficiency and takes 2000 units daily.  I recommend rechecking her vitamin D level to see if any additional supplementation is necessary at this point.  I will see her back in 4 weeks  Return in about 4 weeks (around 01/19/2022) for fracture follow up (new xrays).

## 2022-01-19 ENCOUNTER — Ambulatory Visit (INDEPENDENT_AMBULATORY_CARE_PROVIDER_SITE_OTHER): Payer: BLUE CROSS/BLUE SHIELD | Admitting: Podiatry

## 2022-01-19 ENCOUNTER — Ambulatory Visit (INDEPENDENT_AMBULATORY_CARE_PROVIDER_SITE_OTHER): Payer: BLUE CROSS/BLUE SHIELD

## 2022-01-19 DIAGNOSIS — S92352A Displaced fracture of fifth metatarsal bone, left foot, initial encounter for closed fracture: Secondary | ICD-10-CM | POA: Diagnosis not present

## 2022-01-19 NOTE — Patient Instructions (Signed)
Please keep wearing the boot for at least 1 more month to let the fracture heal. So far it has not, if it does not heal then you will need surgery to fix it

## 2022-01-19 NOTE — Progress Notes (Signed)
  Subjective:  Patient ID: Sonya [name], female    DOB: 1991/01/17,  MRN: [id number]  Chief Complaint  Patient presents with   Fracture    1 month follow up left foot    31 y.o. female presents with the above complaint. History confirmed with patient.  Since last visit she says she stopped wearing the boot was too difficult to get around the house with her 22-year-old  Objective:  Physical Exam: warm, good capillary refill, no trophic changes or ulcerative lesions, normal DP and PT pulses, normal sensory exam, and today there is minimal pain there is some edema, no warmth  Radiographs: Multiple views x-ray of the left foot: New films taken today show diastases of fracture site with minimal bridging Assessment:   1. Closed fracture of base of fifth metatarsal bone of left foot      Plan:  Patient was evaluated and treated and all questions answered.  We reviewed her radiographs together.  She has been weightbearing in flip-flops without her supportive boot.  I discussed the need for immobilization still and the rationale behind this.  Discussed that she is putting this at risk that delayed or nonunion.  I understand its difficult to get around in the boot especially with a young child but I discussed with her if this does not heal or is slow to heal she likely may require surgical intervention we will restart the healing process over.  She says she will wear the boot and I will see her back in 1 month for follow-up x-rays  Return in about 1 month (around 02/18/2022) for fracture follow up (new xrays left foot).

## 2022-02-06 ENCOUNTER — Emergency Department
Admission: EM | Admit: 2022-02-06 | Discharge: 2022-02-06 | Discharge status: Against Medical Advice | Payer: BLUE CROSS/BLUE SHIELD | Attending: Physician Assistant | Admitting: Physician Assistant

## 2022-02-06 ENCOUNTER — Emergency Department: Payer: BLUE CROSS/BLUE SHIELD

## 2022-02-06 ENCOUNTER — Other Ambulatory Visit: Payer: Self-pay

## 2022-02-06 ENCOUNTER — Encounter: Payer: Self-pay | Admitting: Emergency Medicine

## 2022-02-06 DIAGNOSIS — Z5321 Procedure and treatment not carried out due to patient leaving prior to being seen by health care provider: Secondary | ICD-10-CM | POA: Insufficient documentation

## 2022-02-06 DIAGNOSIS — R200 Anesthesia of skin: Secondary | ICD-10-CM | POA: Diagnosis present

## 2022-02-06 DIAGNOSIS — R531 Weakness: Secondary | ICD-10-CM | POA: Insufficient documentation

## 2022-02-06 LAB — CBC WITH DIFFERENTIAL/PLATELET
Abs Immature Granulocytes: 0.09 10*3/uL — ABNORMAL HIGH (ref 0.00–0.07)
Basophils Absolute: 0.1 10*3/uL (ref 0.0–0.1)
Basophils Relative: 1 %
Eosinophils Absolute: 0.3 10*3/uL (ref 0.0–0.5)
Eosinophils Relative: 3 %
HCT: 38.5 % (ref 36.0–46.0)
Hemoglobin: 12.6 g/dL (ref 12.0–15.0)
Immature Granulocytes: 1 %
Lymphocytes Relative: 26 %
Lymphs Abs: 3 10*3/uL (ref 0.7–4.0)
MCH: 28.1 pg (ref 26.0–34.0)
MCHC: 32.7 g/dL (ref 30.0–36.0)
MCV: 85.9 fL (ref 80.0–100.0)
Monocytes Absolute: 0.4 10*3/uL (ref 0.1–1.0)
Monocytes Relative: 3 %
Neutro Abs: 7.8 10*3/uL — ABNORMAL HIGH (ref 1.7–7.7)
Neutrophils Relative %: 66 %
Platelets: 285 10*3/uL (ref 150–400)
RBC: 4.48 MIL/uL (ref 3.87–5.11)
RDW: 13.3 % (ref 11.5–15.5)
WBC: 11.7 10*3/uL — ABNORMAL HIGH (ref 4.0–10.5)
nRBC: 0 % (ref 0.0–0.2)

## 2022-02-06 LAB — PROTIME-INR
INR: 1.1 (ref 0.8–1.2)
Prothrombin Time: 14.5 seconds (ref 11.4–15.2)

## 2022-02-06 LAB — BASIC METABOLIC PANEL
Anion gap: 6 (ref 5–15)
BUN: 12 mg/dL (ref 6–20)
CO2: 25 mmol/L (ref 22–32)
Calcium: 9.3 mg/dL (ref 8.9–10.3)
Chloride: 109 mmol/L (ref 98–111)
Creatinine, Ser: 0.65 mg/dL (ref 0.44–1.00)
GFR, Estimated: >60 mL/min (ref >60)
Glucose, Bld: 94 mg/dL (ref 70–99)
Potassium: 3.8 mmol/L (ref 3.5–5.1)
Sodium: 140 mmol/L (ref 135–145)

## 2022-02-06 NOTE — ED Provider Triage Note (Signed)
Emergency Medicine Provider Triage Evaluation Note  [name],a 31 y.o. female  was evaluated in triage.  Pt complains of numbness and weakness to the left side of her face.  Patient noted yesterday that her tongue was a little bit numb on the left side.  Today she began to experience some weakness on the left side of her face.  She notes that her smile was uneven but she denies any headache, chest pain, shortness of breath patient also denies any distal paresthesias, syncope, or weakness.  Review of Systems  Positive: Left facial numbness, left tongue numbness Negative: CP, distal paresthesias  Physical Exam  BP 120/82   Pulse 96   Temp 98.5 F (36.9 C) (Oral)   Resp 20   Ht 5\' 3"  (1.6 m)   Wt 97.5 kg   SpO2 97%   BMI 38.09 kg/m  Gen:   Awake, no distress  NAD Resp:  Normal effort CTA MSK:   Moves extremities without difficulty  NEURO:  Subtle left mouth weakness, decreased left brow raise.   Medical Decision Making  Medically screening exam initiated at 5:13 PM.  Appropriate orders placed.  [name] was informed that the remainder of the evaluation will be completed by another provider, this initial triage assessment does not replace that evaluation, and the importance of remaining in the ED until their evaluation is complete.  Patient to the ED for evaluation of left-sided facial weakness with some lateral tongue numbness.   [name], PA-C 02/06/22 1723

## 2022-02-06 NOTE — ED Notes (Signed)
Lab sts they will do the labs ordered with the blood down there.

## 2022-02-06 NOTE — ED Notes (Signed)
Patient states she is leaving without treatment. AOX4. States she is not going to wait any longer to see provider. Ambulatory with steady gait.

## 2022-02-06 NOTE — ED Triage Notes (Signed)
Pt via POV from home. Pt c/o L sided facial numbness, states that it started with her tongue being numb last night. Then states today the L side of her face is numb. L sided facial paralysis noted. Pt has recently had cold like symptoms. Pt is A&OX4 and NAD

## 2022-02-16 ENCOUNTER — Other Ambulatory Visit: Payer: Self-pay | Admitting: Podiatry

## 2022-02-16 ENCOUNTER — Ambulatory Visit (INDEPENDENT_AMBULATORY_CARE_PROVIDER_SITE_OTHER): Payer: BLUE CROSS/BLUE SHIELD

## 2022-02-16 ENCOUNTER — Ambulatory Visit (INDEPENDENT_AMBULATORY_CARE_PROVIDER_SITE_OTHER): Payer: BLUE CROSS/BLUE SHIELD | Admitting: Podiatry

## 2022-02-16 DIAGNOSIS — S92352A Displaced fracture of fifth metatarsal bone, left foot, initial encounter for closed fracture: Secondary | ICD-10-CM

## 2022-02-17 NOTE — Progress Notes (Signed)
  Subjective:  Patient ID: Sonya [name], female    DOB: 15-Apr-1990,  MRN: [id number]  Chief Complaint  Patient presents with   Fracture    Closed fracture of base of fifth metatarsal bone of left foot    31 y.o. female presents with the above complaint. History confirmed with patient.  She has been wearing the boot.  She has not had any pain.  She did not complete the lab work  Objective:  Physical Exam: warm, good capillary refill, no trophic changes or ulcerative lesions, normal DP and PT pulses, normal sensory exam, and today there is no pain or the fifth metatarsal base  Radiographs: Multiple views x-ray of the left foot: New films taken today show not much significant increase in bone healing Assessment:   1. Closed fracture of base of fifth metatarsal bone of left foot      Plan:  Patient was evaluated and treated and all questions answered.  We reviewed her radiographs together.  So far not much increasing bone healing.  I recommended she have her vitamin D level and calcium checked again and these orders were given to her she will go across the street to [facility] today to get these done.  I do think she may be able to transition back to a stiff soled supportive shoes and she does not have any pain at this point.  I will see her back in 1 month for new x-rays  Return in about 1 month (around 03/19/2022) for fracture follow up new xrays .

## 2022-02-18 LAB — VITAMIN D 25 HYDROXY (VIT D DEFICIENCY, FRACTURES): Vit D, 25-Hydroxy: 31.2 ng/mL (ref 30.0–100.0)

## 2022-02-18 LAB — CALCIUM: Calcium: 9.5 mg/dL (ref 8.7–10.2)

## 2022-03-28 ENCOUNTER — Ambulatory Visit (INDEPENDENT_AMBULATORY_CARE_PROVIDER_SITE_OTHER): Payer: BLUE CROSS/BLUE SHIELD

## 2022-03-28 ENCOUNTER — Ambulatory Visit (INDEPENDENT_AMBULATORY_CARE_PROVIDER_SITE_OTHER): Payer: BLUE CROSS/BLUE SHIELD | Admitting: Podiatry

## 2022-03-28 DIAGNOSIS — S92352A Displaced fracture of fifth metatarsal bone, left foot, initial encounter for closed fracture: Secondary | ICD-10-CM | POA: Diagnosis not present

## 2022-03-28 DIAGNOSIS — S92352K Displaced fracture of fifth metatarsal bone, left foot, subsequent encounter for fracture with nonunion: Secondary | ICD-10-CM | POA: Diagnosis not present

## 2022-03-28 NOTE — Progress Notes (Signed)
Completed.

## 2022-03-28 NOTE — Progress Notes (Signed)
  Subjective:  Patient ID: Sonya [name], female    DOB: [birth year],  MRN: [id number]  Chief Complaint  Patient presents with   Fracture    6 week follow up    32 y.o. female presents with the above complaint. History confirmed with patient.  Still having some pain in the fracture  Objective:  Physical Exam: warm, good capillary refill, no trophic changes or ulcerative lesions, normal DP and PT pulses, normal sensory exam, and today she does have some tender to palpation  Radiographs: Multiple views x-ray of the left foot: New films taken today show slight increase in consolidation but fracture lucency still visible at fracture site Assessment:   1. 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  Reviewed her radiographs together.  Discussed presence of the fracture that is persistent and she now has a nonunion 3 months out.  I recommended noninvasive bone stimulation.  Referral was sent for this.  Likely her lab work is good she will continue taking her over-the-counter vitamin D supplement.  I will see her back in 8 weeks for new x-rays  Return in about 8 weeks (around 05/23/2022) for fracture follow up .

## 2022-05-23 ENCOUNTER — Ambulatory Visit (INDEPENDENT_AMBULATORY_CARE_PROVIDER_SITE_OTHER): Payer: BLUE CROSS/BLUE SHIELD

## 2022-05-23 ENCOUNTER — Ambulatory Visit (INDEPENDENT_AMBULATORY_CARE_PROVIDER_SITE_OTHER): Payer: BLUE CROSS/BLUE SHIELD | Admitting: Podiatry

## 2022-05-23 DIAGNOSIS — S93491A Sprain of other ligament of right ankle, initial encounter: Secondary | ICD-10-CM | POA: Diagnosis not present

## 2022-05-23 DIAGNOSIS — S92352K Displaced fracture of fifth metatarsal bone, left foot, subsequent encounter for fracture with nonunion: Secondary | ICD-10-CM

## 2022-05-23 NOTE — Progress Notes (Signed)
  Subjective:  Patient ID: Sonya [name], female    DOB: 24-Jul-1990,  MRN: [id number]  Chief Complaint  Patient presents with   Fracture    Follow up left foot fracture    32 y.o. female presents with the above complaint. History confirmed with patient.  She notes that is doing much better still occasional tenderness in certain positions or when she is on her foot but much improved since last visit she has been using the bone stimulator for approximately 25 days now.  She also rolled her right ankle over the weekend.  Objective:  Physical Exam: warm, good capillary refill, no trophic changes or ulcerative lesions, normal DP and PT pulses, normal sensory exam, and today she does not have any tenderness to palpation.  Right ankle has some tenderness over the ATFL but no gross instability  Radiographs: Multiple views x-ray of the left foot: Large improvement in fracture site healing Assessment:   1. 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 Sprain of anterior talofibular ligament of right ankle, initial encounter      Plan:  Patient was evaluated and treated and all questions answered.  She is doing much better with the bone marrow stimulation and is nearly fully healed.  I recommend she continue utilizing the stimulator for another 5 to 6 weeks and then can discontinue its use.  Discussed the possibility of refracture.  She will return to see me on a as needed if this worsens or does not improve.   We discussed the injury she has on her right ankle, she had no pain that indicated need for x-rays today, has been able to ambulate and I recommended home PT exercises and a compression sleeve which was dispensed.  She will let me know if this does not improve or worsens and needs to return to see me.   No follow-ups on file.

## 2023-01-31 ENCOUNTER — Other Ambulatory Visit: Payer: Self-pay

## 2023-01-31 ENCOUNTER — Emergency Department (HOSPITAL_COMMUNITY): Payer: No Typology Code available for payment source

## 2023-01-31 ENCOUNTER — Encounter (HOSPITAL_COMMUNITY): Payer: Self-pay

## 2023-01-31 ENCOUNTER — Emergency Department (HOSPITAL_COMMUNITY)
Admission: EM | Admit: 2023-01-31 | Discharge: 2023-01-31 | Disposition: A | Discharge status: Home / Self Care | Payer: No Typology Code available for payment source | Attending: Emergency Medicine | Admitting: Emergency Medicine

## 2023-01-31 DIAGNOSIS — R1031 Right lower quadrant pain: Secondary | ICD-10-CM | POA: Diagnosis not present

## 2023-01-31 DIAGNOSIS — S5011XA Contusion of right forearm, initial encounter: Secondary | ICD-10-CM | POA: Insufficient documentation

## 2023-01-31 DIAGNOSIS — R1012 Left upper quadrant pain: Secondary | ICD-10-CM | POA: Insufficient documentation

## 2023-01-31 DIAGNOSIS — Y92410 Unspecified street and highway as the place of occurrence of the external cause: Secondary | ICD-10-CM | POA: Diagnosis not present

## 2023-01-31 DIAGNOSIS — S1091XA Abrasion of unspecified part of neck, initial encounter: Secondary | ICD-10-CM | POA: Diagnosis not present

## 2023-01-31 DIAGNOSIS — S80212A Abrasion, left knee, initial encounter: Secondary | ICD-10-CM | POA: Insufficient documentation

## 2023-01-31 DIAGNOSIS — R1032 Left lower quadrant pain: Secondary | ICD-10-CM | POA: Diagnosis not present

## 2023-01-31 DIAGNOSIS — T148XXA Other injury of unspecified body region, initial encounter: Secondary | ICD-10-CM

## 2023-01-31 DIAGNOSIS — M542 Cervicalgia: Secondary | ICD-10-CM | POA: Diagnosis present

## 2023-01-31 LAB — COMPREHENSIVE METABOLIC PANEL
ALT: 14 U/L (ref 0–44)
AST: 18 U/L (ref 15–41)
Albumin: 4.1 g/dL (ref 3.5–5.0)
Alkaline Phosphatase: 48 U/L (ref 38–126)
Anion gap: 9 (ref 5–15)
BUN: 13 mg/dL (ref 6–20)
CO2: 24 mmol/L (ref 22–32)
Calcium: 9.6 mg/dL (ref 8.9–10.3)
Chloride: 105 mmol/L (ref 98–111)
Creatinine, Ser: 0.69 mg/dL (ref 0.44–1.00)
GFR, Estimated: >60 mL/min (ref >60)
Glucose, Bld: 92 mg/dL (ref 70–99)
Potassium: 4.1 mmol/L (ref 3.5–5.1)
Sodium: 138 mmol/L (ref 135–145)
Total Bilirubin: 0.6 mg/dL (ref <1.2)
Total Protein: 7.5 g/dL (ref 6.5–8.1)

## 2023-01-31 LAB — CBC WITH DIFFERENTIAL/PLATELET
Abs Immature Granulocytes: 0.05 10*3/uL (ref 0.00–0.07)
Basophils Absolute: 0.1 10*3/uL (ref 0.0–0.1)
Basophils Relative: 1 %
Eosinophils Absolute: 0.2 10*3/uL (ref 0.0–0.5)
Eosinophils Relative: 2 %
HCT: 40.7 % (ref 36.0–46.0)
Hemoglobin: 12.9 g/dL (ref 12.0–15.0)
Immature Granulocytes: 1 %
Lymphocytes Relative: 22 %
Lymphs Abs: 2.4 10*3/uL (ref 0.7–4.0)
MCH: 27.3 pg (ref 26.0–34.0)
MCHC: 31.7 g/dL (ref 30.0–36.0)
MCV: 86 fL (ref 80.0–100.0)
Monocytes Absolute: 0.4 10*3/uL (ref 0.1–1.0)
Monocytes Relative: 4 %
Neutro Abs: 7.6 10*3/uL (ref 1.7–7.7)
Neutrophils Relative %: 70 %
Platelets: 242 10*3/uL (ref 150–400)
RBC: 4.73 MIL/uL (ref 3.87–5.11)
RDW: 13.2 % (ref 11.5–15.5)
WBC: 10.8 10*3/uL — ABNORMAL HIGH (ref 4.0–10.5)
nRBC: 0 % (ref 0.0–0.2)

## 2023-01-31 MED ORDER — ACETAMINOPHEN 325 MG PO TABS
650.0000 mg | ORAL_TABLET | Freq: Once | ORAL | Status: AC
  Administered 2023-01-31: 650 mg via ORAL
  Filled 2023-01-31: qty 2

## 2023-01-31 MED ORDER — IOHEXOL 350 MG/ML SOLN
75.0000 mL | Freq: Once | INTRAVENOUS | Status: AC | PRN
  Administered 2023-01-31: 75 mL via INTRAVENOUS
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
## 2023-01-31 NOTE — Discharge Instructions (Signed)
You may take Tylenol as needed for pain.  Ice the first 24 to 40 hours and switch to heat  Return for new worsening symptoms such as increased swelling, numbness, weakness, vomiting  Due to recent surgery cannot take anti-inflammatories.
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
## 2023-01-31 NOTE — ED Notes (Signed)
Patient Alert and oriented to baseline. Stable and ambulatory to baseline. Patient verbalized understanding of the discharge instructions.  Patient belongings were taken by the patient.

## 2023-07-10 ENCOUNTER — Other Ambulatory Visit: Payer: Self-pay

## 2023-07-10 ENCOUNTER — Emergency Department: Payer: Self-pay

## 2023-07-10 ENCOUNTER — Emergency Department
Admission: EM | Admit: 2023-07-10 | Discharge: 2023-07-10 | Discharge status: Against Medical Advice | Payer: Self-pay | Attending: Emergency Medicine | Admitting: Emergency Medicine

## 2023-07-10 ENCOUNTER — Encounter: Payer: Self-pay | Admitting: Emergency Medicine

## 2023-07-10 DIAGNOSIS — M79605 Pain in left leg: Secondary | ICD-10-CM

## 2023-07-10 DIAGNOSIS — I10 Essential (primary) hypertension: Secondary | ICD-10-CM | POA: Insufficient documentation

## 2023-07-10 DIAGNOSIS — M7989 Other specified soft tissue disorders: Secondary | ICD-10-CM | POA: Insufficient documentation

## 2023-07-10 DIAGNOSIS — M79662 Pain in left lower leg: Secondary | ICD-10-CM | POA: Insufficient documentation

## 2023-07-10 NOTE — Discharge Instructions (Signed)
 Your ultrasound was negative for a DVT.  However I have given you information about a DVT so you know what to look for.  If you continue to have pain in the left lower leg please return emergency department or see your doctor in a week for repeat ultrasound.

## 2023-07-10 NOTE — ED Provider Notes (Signed)
[facility] Provider Note    Event Date/Time   First MD Initiated Contact with Patient 07/10/23 1610     (approximate)   History   Leg Swelling   HPI  Sonya [name] is a 33 y.o. female history of hypertension, bipolar presents emergency department with complaints of left lower leg pain and swelling.  Symptoms started this morning.  States family history of blood clots.  No fever, chills, cough or congestion.  No chest pain or shortness of breath      Physical Exam   Triage Vital Signs: ED Triage Vitals [07/10/23 1426]  Encounter Vitals Group     BP 130/62     Systolic BP Percentile      Diastolic BP Percentile      Pulse Rate 67     Resp 17     Temp 98.3 F (36.8 C)     Temp Source Oral     SpO2 99 %     Weight 185 lb (83.9 kg)     Height 5\' 3"  (1.6 m)     Head Circumference      Peak Flow      Pain Score 3     Pain Loc      Pain Education      Exclude from Growth Chart     Most recent vital signs: Vitals:   07/10/23 1426  BP: 130/62  Pulse: 67  Resp: 17  Temp: 98.3 F (36.8 C)  SpO2: 99%     General: Awake, no distress.   CV:  Good peripheral perfusion. Resp:  Normal effort. Abd:  No distention.   Other:  Left lower leg tender to palpation, minimal swelling noted, no redness   ED Results / Procedures / Treatments   Labs (all labs ordered are listed, but only abnormal results are displayed) Labs Reviewed - No data to display   EKG     RADIOLOGY Ultrasound left lower extremity    PROCEDURES:   Procedures Chief Complaint  Patient presents with   Leg Swelling      MEDICATIONS ORDERED IN ED: Medications - No data to display   IMPRESSION / MDM / ASSESSMENT AND PLAN / ED COURSE  I reviewed the triage vital signs and the nursing notes.                              Differential diagnosis includes, but is not limited to, muscle strain, Baker's cyst, DVT, pain  Patient's presentation is most  consistent with acute illness / injury with system symptoms.   Ultrasound left lower extremity independently reviewed interpreted by me as being negative for any acute abnormality  Did explain findings to patient.  She is to follow-up with her regular doctor as needed.  She should have a repeat ultrasound in 1 week if the pain has not improved or if continues to have pain and swelling.  She is in agreement treatment plan.  Discharged stable condition.      FINAL CLINICAL IMPRESSION(S) / ED DIAGNOSES   Final diagnoses:  Left leg pain     Rx / DC Orders   ED Discharge Orders     None        Note:  This document was prepared using Dragon voice recognition software and may include unintentional dictation errors.    Delsie Figures, PA-C 07/10/23 1646    Shane Darling, MD 07/10/23  1729  

## 2023-07-10 NOTE — ED Triage Notes (Signed)
 Patient to ED via POV for lower left leg swelling. States she noticed it today while grocery shopping. Hx of blood clot in family.

## 2023-12-25 ENCOUNTER — Emergency Department
Admission: EM | Admit: 2023-12-25 | Discharge: 2023-12-25 | Disposition: A | Discharge status: Home / Self Care | Attending: Emergency Medicine | Admitting: Emergency Medicine

## 2023-12-25 ENCOUNTER — Other Ambulatory Visit: Payer: Self-pay

## 2023-12-25 DIAGNOSIS — R197 Diarrhea, unspecified: Secondary | ICD-10-CM | POA: Insufficient documentation

## 2023-12-25 DIAGNOSIS — E876 Hypokalemia: Secondary | ICD-10-CM | POA: Insufficient documentation

## 2023-12-25 DIAGNOSIS — R1032 Left lower quadrant pain: Secondary | ICD-10-CM | POA: Insufficient documentation

## 2023-12-25 LAB — URINALYSIS, ROUTINE W REFLEX MICROSCOPIC
Bilirubin Urine: NEGATIVE
Glucose, UA: NEGATIVE mg/dL
Hgb urine dipstick: NEGATIVE
Ketones, ur: NEGATIVE mg/dL
Nitrite: NEGATIVE
Protein, ur: NEGATIVE mg/dL
Specific Gravity, Urine: 1.01 (ref 1.005–1.030)
pH: 7 (ref 5.0–8.0)

## 2023-12-25 LAB — COMPREHENSIVE METABOLIC PANEL WITH GFR
ALT: 18 U/L (ref 0–44)
AST: 21 U/L (ref 15–41)
Albumin: 4 g/dL (ref 3.5–5.0)
Alkaline Phosphatase: 50 U/L (ref 38–126)
Anion gap: 12 (ref 5–15)
BUN: 10 mg/dL (ref 6–20)
CO2: 25 mmol/L (ref 22–32)
Calcium: 9 mg/dL (ref 8.9–10.3)
Chloride: 103 mmol/L (ref 98–111)
Creatinine, Ser: 0.51 mg/dL (ref 0.44–1.00)
GFR, Estimated: >60 mL/min (ref >60)
Glucose, Bld: 86 mg/dL (ref 70–99)
Potassium: 3.4 mmol/L — ABNORMAL LOW (ref 3.5–5.1)
Sodium: 140 mmol/L (ref 135–145)
Total Bilirubin: 0.6 mg/dL (ref 0.0–1.2)
Total Protein: 7.7 g/dL (ref 6.5–8.1)

## 2023-12-25 LAB — CBC
HCT: 39.6 % (ref 36.0–46.0)
Hemoglobin: 12.9 g/dL (ref 12.0–15.0)
MCH: 28.5 pg (ref 26.0–34.0)
MCHC: 32.6 g/dL (ref 30.0–36.0)
MCV: 87.4 fL (ref 80.0–100.0)
Platelets: 290 K/uL (ref 150–400)
RBC: 4.53 MIL/uL (ref 3.87–5.11)
RDW: 13.1 % (ref 11.5–15.5)
WBC: 8.6 K/uL (ref 4.0–10.5)
nRBC: 0 % (ref 0.0–0.2)

## 2023-12-25 LAB — LIPASE, BLOOD: Lipase: 38 U/L (ref 11–51)

## 2023-12-25 LAB — PREGNANCY, URINE: Preg Test, Ur: NEGATIVE

## 2023-12-25 MED ORDER — SODIUM CHLORIDE 0.9 % IV BOLUS
1000.0000 mL | Freq: Once | INTRAVENOUS | Status: AC
  Administered 2023-12-25: 1000 mL via INTRAVENOUS

## 2023-12-25 MED ORDER — ONDANSETRON HCL 4 MG/2ML IJ SOLN
4.0000 mg | Freq: Once | INTRAMUSCULAR | Status: DC
  Filled 2023-12-25: qty 2

## 2023-12-25 MED ORDER — KETOROLAC TROMETHAMINE 15 MG/ML IJ SOLN
15.0000 mg | Freq: Once | INTRAMUSCULAR | Status: AC
  Administered 2023-12-25: 15 mg via INTRAVENOUS
  Filled 2023-12-25: qty 1

## 2023-12-25 NOTE — ED Provider Notes (Signed)
 [facility] Provider Note    Event Date/Time   First MD Initiated Contact with Patient 12/25/23 [phone]     (approximate)   History   Diarrhea   HPI  Sonya [name] is a 33 y.o. female with history of gastric sleeve who comes in with concerns for diarrhea over the past few weeks.  Patient reports about 4-5 episodes of diarrhea intermittently over the past few weeks.  She denies any recent antibiotics, recent travel, been exposed to lake water or anybody else with similar symptoms.  She reports that she came in today because it seemed more yellowish in nature and she was feeling more weak and felt that she could be dehydrated.  She denies any pain in her lower abdomen.  She does report some generalized aching of her low back but denies it being situated on one side.  She does report issues with diarrhea previously but states that this yellowish color of stooling was very different for her which is what made her present today.  Patient had a CT of her abdomen in November 2024 that was reassuring  Physical Exam   Triage Vital Signs: ED Triage Vitals  Encounter Vitals Group     BP 12/25/23 0903 133/78     Girls Systolic BP Percentile --      Girls Diastolic BP Percentile --      Boys Systolic BP Percentile --      Boys Diastolic BP Percentile --      Pulse Rate 12/25/23 0903 95     Resp 12/25/23 0903 19     Temp 12/25/23 0903 99.3 F (37.4 C)     Temp src --      SpO2 12/25/23 0903 100 %     Weight 12/25/23 0908 185 lb (83.9 kg)     Height 12/25/23 0908 5' 2 (1.575 m)     Head Circumference --      Peak Flow --      Pain Score 12/25/23 0907 3     Pain Loc --      Pain Education --      Exclude from Growth Chart --     Most recent vital signs: Vitals:   12/25/23 0903  BP: 133/78  Pulse: 95  Resp: 19  Temp: 99.3 F (37.4 C)  SpO2: 100%     General: Awake, no distress.  CV:  Good peripheral perfusion.  Resp:  Normal effort.  Abd:  No  distention.  Soft and nontender Other:  No swelling in legs No CVA tenderness  ED Results / Procedures / Treatments   Labs (all labs ordered are listed, but only abnormal results are displayed) Labs Reviewed  GASTROINTESTINAL PANEL BY PCR, STOOL (REPLACES STOOL CULTURE)  C DIFFICILE QUICK SCREEN W PCR REFLEX    LIPASE, BLOOD  COMPREHENSIVE METABOLIC PANEL WITH GFR  CBC  URINALYSIS, ROUTINE W REFLEX MICROSCOPIC  POC URINE PREG, ED     RADIOLOGY   PROCEDURES:  Critical Care performed: No  Procedures   MEDICATIONS ORDERED IN ED: Medications  ondansetron  (ZOFRAN ) injection 4 mg (4 mg Intravenous Not Given 12/25/23 0941)  sodium chloride  0.9 % bolus 1,000 mL (1,000 mLs Intravenous New Bag/Given 12/25/23 0942)  ketorolac  (TORADOL ) 15 MG/ML injection 15 mg (15 mg Intravenous Given 12/25/23 0939)     IMPRESSION / MDM / ASSESSMENT AND PLAN / ED COURSE  I reviewed the triage vital signs and the nursing notes.   Patient's presentation is  most consistent with acute presentation with potential threat to life or bodily function.   Differential includes Electra abnormalities, AKI given the persistence of the diarrhea.  Going on for a few weeks we did recommend stool testing to evaluate for bacterial, viral versus C. difficile causes of diarrhea.  Discussed CT imaging with her but her abdomen is soft and nontender she has got no pain will be unlikely to find anything such as perforation, ovarian torsion, appendicitis given reassuring exam therefore we will treat with medications and reevaluate for need for CT imaging.  CBC shows normal white count her hemoglobin is normal.  Her lipase is normal.  CMP shows slightly low potassium at 3.4.  Her urine   11:48 AM repeat evaluation abdomen is overall soft and nontender.  She reports an intermittent lower left abdominal pain we discussed CT imaging but she opted to want to hold off.  We discussed the possibility of diverticulitis, perforation  but her exam is overall very reassuring and has normal white count and so she wanted to hold off.  She has been unable to give a stool sample yet but states that every time she eats she is able to then produce diarrhea so we will do p.o. challenge.  She had declined to Zofran  earlier given she already had Zofran  prior to coming in.  I suspect that this could be some chronic malabsorption issues related to her gastric sleeve I do not believe this represents a bowel obstruction as she is tolerating p.o. and producing gas/bowel movements.  We discussed following up with her GI team   12:08 PM patient is tolerated p.o. but still unable to give stool sample.  She preferred to follow-up with her primary care doctor for stool testing and she does not have anybody she can follow-up for her gastric sleeve as her insurance does not cover the provider who did the surgery.  Explained to her that I am not sure if our surgeons will follow-up with gastric sleeve's or not but she can call the office and see if they will.  Otherwise I recommended that she follow-up with her primary care doctor for stool testing.  She reports that her biggest reason for coming in was feeling dehydrated and that she feels much improved after the fluids and feels comfortable with discharge home.  Her urine is without evidence of UTI.  Patient has tolerated p.o. repeat abdominal exam soft nontender and she feels comfortable with discharge home    FINAL CLINICAL IMPRESSION(S) / ED DIAGNOSES   Final diagnoses:  Diarrhea, unspecified type     Rx / DC Orders   ED Discharge Orders     None        Note:  This document was prepared using Dragon voice recognition software and may include unintentional dictation errors.   Ernest Ronal BRAVO, MD 12/25/23 [phone]

## 2023-12-25 NOTE — Discharge Instructions (Signed)
 Your workup was reassuring we given you some fluids to help with dehydration you should call your primary care doctor to schedule a follow-up appointment and try to take a stool sample there for testing.  We are unable to give a stool sample here.  We did not do any CT imaging today given you did not really have any tenderness in your abdomen but if you develop any fevers, pain that is not relieving or any other concerns you should return to the ER for repeat evaluation

## 2023-12-25 NOTE — ED Triage Notes (Signed)
 Pt arrives via POV with c/o diarrhea for the last few weeks, has had intermittent stomach and lower back pain and believes that they are dehydrated. Pt states that yesterday the diarrhea became a yellow-ish color. Pt had gastric sleeve surgery a year ago. Pt is A&Ox4 and ambulatory during triage.
